# Patient Record
Sex: Female | Born: 1940 | Race: White | Hispanic: No | Marital: Married | Smoking: Never smoker
Health system: Southern US, Community
[De-identification: ages and names within clinical notes are randomized; demographics above are authoritative.]

## PROBLEM LIST (undated history)

## (undated) DIAGNOSIS — I509 Heart failure, unspecified: Secondary | ICD-10-CM

## (undated) DIAGNOSIS — F329 Major depressive disorder, single episode, unspecified: Secondary | ICD-10-CM

## (undated) DIAGNOSIS — F0390 Unspecified dementia without behavioral disturbance: Secondary | ICD-10-CM

## (undated) DIAGNOSIS — M109 Gout, unspecified: Secondary | ICD-10-CM

## (undated) DIAGNOSIS — H409 Unspecified glaucoma: Secondary | ICD-10-CM

## (undated) DIAGNOSIS — D649 Anemia, unspecified: Secondary | ICD-10-CM

## (undated) DIAGNOSIS — F32A Depression, unspecified: Secondary | ICD-10-CM

## (undated) DIAGNOSIS — N289 Disorder of kidney and ureter, unspecified: Secondary | ICD-10-CM

## (undated) DIAGNOSIS — I639 Cerebral infarction, unspecified: Secondary | ICD-10-CM

## (undated) DIAGNOSIS — Z950 Presence of cardiac pacemaker: Secondary | ICD-10-CM

## (undated) DIAGNOSIS — I739 Peripheral vascular disease, unspecified: Secondary | ICD-10-CM

## (undated) DIAGNOSIS — I2510 Atherosclerotic heart disease of native coronary artery without angina pectoris: Secondary | ICD-10-CM

## (undated) DIAGNOSIS — N189 Chronic kidney disease, unspecified: Secondary | ICD-10-CM

## (undated) DIAGNOSIS — I10 Essential (primary) hypertension: Secondary | ICD-10-CM

## (undated) DIAGNOSIS — F419 Anxiety disorder, unspecified: Secondary | ICD-10-CM

## (undated) HISTORY — PX: CAROTID ENDARTERECTOMY: SUR193

## (undated) HISTORY — DX: Major depressive disorder, single episode, unspecified: F32.9

## (undated) HISTORY — PX: ABDOMINAL SURGERY: SHX537

## (undated) HISTORY — DX: Gout, unspecified: M10.9

## (undated) HISTORY — DX: Essential (primary) hypertension: I10

## (undated) HISTORY — DX: Cerebral infarction, unspecified: I63.9

## (undated) HISTORY — DX: Depression, unspecified: F32.A

## (undated) HISTORY — PX: CHOLECYSTECTOMY: SHX55

## (undated) HISTORY — DX: Heart failure, unspecified: I50.9

## (undated) HISTORY — PX: CORONARY ANGIOPLASTY WITH STENT PLACEMENT: SHX49

## (undated) HISTORY — DX: Anxiety disorder, unspecified: F41.9

## (undated) HISTORY — DX: Unspecified glaucoma: H40.9

## (undated) HISTORY — DX: Anemia, unspecified: D64.9

## (undated) HISTORY — DX: Peripheral vascular disease, unspecified: I73.9

## (undated) HISTORY — PX: PERCUTANEOUS PLACEMENT INTRAVASCULAR STENT CERVICAL CAROTID ARTERY: SUR1019

## (undated) HISTORY — PX: APPENDECTOMY: SHX54

## (undated) HISTORY — DX: Atherosclerotic heart disease of native coronary artery without angina pectoris: I25.10

## (undated) HISTORY — DX: Chronic kidney disease, unspecified: N18.9

## (undated) HISTORY — PX: PACEMAKER INSERTION: SHX728

---

## 2007-02-23 ENCOUNTER — Emergency Department: Payer: Self-pay | Admitting: Emergency Medicine

## 2007-06-05 ENCOUNTER — Ambulatory Visit: Payer: Self-pay | Admitting: Vascular Surgery

## 2007-06-18 ENCOUNTER — Emergency Department: Payer: Self-pay | Admitting: Emergency Medicine

## 2008-01-22 ENCOUNTER — Emergency Department (HOSPITAL_COMMUNITY): Admission: EM | Admit: 2008-01-22 | Discharge: 2008-01-22 | Payer: Self-pay | Admitting: Family Medicine

## 2010-10-11 NOTE — Procedures (Signed)
CAROTID DUPLEX EXAM   INDICATION:  Follow-up evaluation of known coronary artery disease.   HISTORY:  Diabetes:  Yes.  Cardiac:  No.  Hypertension:  Yes.  Smoking:  No.  Previous Surgery:  Right carotid endarterectomy by Rosezetta Schlatter on  March 28, 2007.  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  Amaurosis Fugax No, Paresthesias No, Hemiparesis Yes                                       RIGHT             LEFT  Brachial systolic pressure:         148               160  Brachial Doppler waveforms:         Triphasic         Triphasic  Vertebral direction of flow:        Antegrade         Antegrade  DUPLEX VELOCITIES (cm/sec)  CCA peak systolic                   54                63  ECA peak systolic                   246               111  ICA peak systolic                   201               Occluded  ICA end diastolic                   66                Occluded  PLAQUE MORPHOLOGY:                  Mixed             Mixed  PLAQUE AMOUNT:                      Moderate          Severe  PLAQUE LOCATION:                    Proximal ICA, ECA Throughout ICA   IMPRESSION:  1. 60-79% right internal carotid artery stenosis.  2. Occluded left internal carotid artery.  3. RECA stenosis.   ___________________________________________  Larina Earthly, M.D.   MC/MEDQ  D:  06/05/2007  T:  06/05/2007  Job:  [id number]

## 2012-09-24 LAB — COMPREHENSIVE METABOLIC PANEL
Albumin: 3.3 g/dL — ABNORMAL LOW (ref 3.4–5.0)
Alkaline Phosphatase: 125 U/L (ref 50–136)
Creatinine: 1.89 mg/dL — ABNORMAL HIGH (ref 0.60–1.30)
EGFR (Non-African Amer.): 26 — ABNORMAL LOW
Glucose: 136 mg/dL — ABNORMAL HIGH (ref 65–99)
Potassium: 3.7 mmol/L (ref 3.5–5.1)
SGOT(AST): 25 U/L (ref 15–37)
SGPT (ALT): 13 U/L (ref 12–78)
Sodium: 139 mmol/L (ref 136–145)
Total Protein: 7.6 g/dL (ref 6.4–8.2)

## 2012-09-24 LAB — CBC
HGB: 10.5 g/dL — ABNORMAL LOW (ref 12.0–16.0)
MCH: 31.8 pg (ref 26.0–34.0)
MCV: 96 fL (ref 80–100)
RDW: 15.5 % — ABNORMAL HIGH (ref 11.5–14.5)
WBC: 9.5 10*3/uL (ref 3.6–11.0)

## 2012-09-24 LAB — TROPONIN I: Troponin-I: <0.02 ng/mL

## 2012-09-24 LAB — PRO B NATRIURETIC PEPTIDE: B-Type Natriuretic Peptide: 5367 pg/mL — ABNORMAL HIGH (ref 0–125)

## 2012-09-25 ENCOUNTER — Inpatient Hospital Stay: Payer: Self-pay | Admitting: Internal Medicine

## 2012-09-25 LAB — CBC WITH DIFFERENTIAL/PLATELET
Basophil #: 0.1 10*3/uL (ref 0.0–0.1)
Basophil %: 0.8 %
HCT: 28.8 % — ABNORMAL LOW (ref 35.0–47.0)
HGB: 9.8 g/dL — ABNORMAL LOW (ref 12.0–16.0)
Lymphocyte #: 1.1 10*3/uL (ref 1.0–3.6)
MCH: 32.4 pg (ref 26.0–34.0)
MCHC: 34 g/dL (ref 32.0–36.0)
Monocyte %: 9.6 %
Neutrophil #: 4.6 10*3/uL (ref 1.4–6.5)
RBC: 3.02 10*6/uL — ABNORMAL LOW (ref 3.80–5.20)
RDW: 14.7 % — ABNORMAL HIGH (ref 11.5–14.5)
WBC: 6.7 10*3/uL (ref 3.6–11.0)

## 2012-09-25 LAB — LIPID PANEL
Cholesterol: 107 mg/dL (ref 0–200)
HDL Cholesterol: 39 mg/dL — ABNORMAL LOW (ref 40–60)
Ldl Cholesterol, Calc: 56 mg/dL (ref 0–100)
VLDL Cholesterol, Calc: 12 mg/dL (ref 5–40)

## 2012-09-25 LAB — BASIC METABOLIC PANEL
Anion Gap: 8 (ref 7–16)
BUN: 62 mg/dL — ABNORMAL HIGH (ref 7–18)
Chloride: 109 mmol/L — ABNORMAL HIGH (ref 98–107)
Co2: 24 mmol/L (ref 21–32)
EGFR (African American): 29 — ABNORMAL LOW
EGFR (Non-African Amer.): 25 — ABNORMAL LOW
Sodium: 141 mmol/L (ref 136–145)

## 2012-09-25 LAB — TROPONIN I: Troponin-I: <0.02 ng/mL

## 2012-09-25 LAB — CK TOTAL AND CKMB (NOT AT ARMC)
CK, Total: 49 U/L (ref 21–215)
CK, Total: 27 U/L (ref 21–215)
CK, Total: 28 U/L (ref 21–215)

## 2012-09-25 LAB — MAGNESIUM: Magnesium: 1.9 mg/dL

## 2012-09-25 LAB — HEMOGLOBIN A1C: Hemoglobin A1C: 5.2 % (ref 4.2–6.3)

## 2012-09-26 ENCOUNTER — Ambulatory Visit: Payer: Self-pay | Admitting: Internal Medicine

## 2012-09-26 LAB — CBC WITH DIFFERENTIAL/PLATELET
Basophil #: 0.1 10*3/uL (ref 0.0–0.1)
Eosinophil #: 0.3 10*3/uL (ref 0.0–0.7)
Eosinophil %: 4.1 %
Lymphocyte #: 1.1 10*3/uL (ref 1.0–3.6)
Lymphocyte %: 14.6 %
MCH: 31.6 pg (ref 26.0–34.0)
MCHC: 33.1 g/dL (ref 32.0–36.0)
MCV: 96 fL (ref 80–100)
Monocyte %: 8.6 %
Neutrophil %: 71.3 %
RBC: 3.26 10*6/uL — ABNORMAL LOW (ref 3.80–5.20)
WBC: 7.3 10*3/uL (ref 3.6–11.0)

## 2012-09-26 LAB — COMPREHENSIVE METABOLIC PANEL
Albumin: 3 g/dL — ABNORMAL LOW (ref 3.4–5.0)
Alkaline Phosphatase: 85 U/L (ref 50–136)
BUN: 57 mg/dL — ABNORMAL HIGH (ref 7–18)
Bilirubin,Total: 0.4 mg/dL (ref 0.2–1.0)
Chloride: 109 mmol/L — ABNORMAL HIGH (ref 98–107)
Creatinine: 1.65 mg/dL — ABNORMAL HIGH (ref 0.60–1.30)
EGFR (African American): 36 — ABNORMAL LOW
EGFR (Non-African Amer.): 31 — ABNORMAL LOW
Osmolality: 299 (ref 275–301)
Potassium: 3.2 mmol/L — ABNORMAL LOW (ref 3.5–5.1)
SGPT (ALT): 10 U/L — ABNORMAL LOW (ref 12–78)
Sodium: 142 mmol/L (ref 136–145)
Total Protein: 6.5 g/dL (ref 6.4–8.2)

## 2012-09-27 LAB — COMPREHENSIVE METABOLIC PANEL
Anion Gap: 9 (ref 7–16)
BUN: 53 mg/dL — ABNORMAL HIGH (ref 7–18)
Bilirubin,Total: 0.5 mg/dL (ref 0.2–1.0)
Calcium, Total: 9.3 mg/dL (ref 8.5–10.1)
Chloride: 108 mmol/L — ABNORMAL HIGH (ref 98–107)
Co2: 25 mmol/L (ref 21–32)
Creatinine: 1.51 mg/dL — ABNORMAL HIGH (ref 0.60–1.30)
Osmolality: 297 (ref 275–301)
SGOT(AST): 19 U/L (ref 15–37)
SGPT (ALT): 10 U/L — ABNORMAL LOW (ref 12–78)
Sodium: 142 mmol/L (ref 136–145)

## 2012-09-27 LAB — CA 125: CA 125: 272.9 U/mL — ABNORMAL HIGH (ref 0.0–34.0)

## 2012-09-27 LAB — CEA: CEA: 3.3 ng/mL (ref 0.0–4.7)

## 2012-10-10 ENCOUNTER — Ambulatory Visit: Payer: Self-pay | Admitting: Internal Medicine

## 2012-10-14 ENCOUNTER — Other Ambulatory Visit: Payer: Self-pay | Admitting: Family Medicine

## 2012-10-14 LAB — CBC WITH DIFFERENTIAL/PLATELET
HCT: 38.7 % (ref 35.0–47.0)
Lymphocyte #: 1.4 10*3/uL (ref 1.0–3.6)
Lymphocyte %: 13.9 %
MCH: 31.2 pg (ref 26.0–34.0)
MCHC: 32.7 g/dL (ref 32.0–36.0)
MCV: 96 fL (ref 80–100)
Neutrophil %: 73.7 %
Platelet: 283 10*3/uL (ref 150–440)
RBC: 4.05 10*6/uL (ref 3.80–5.20)
RDW: 15.2 % — ABNORMAL HIGH (ref 11.5–14.5)
WBC: 9.9 10*3/uL (ref 3.6–11.0)

## 2012-10-14 LAB — IRON AND TIBC
Iron: 86 ug/dL (ref 50–170)
Unbound Iron-Bind.Cap.: 277 ug/dL

## 2012-10-14 LAB — FERRITIN: Ferritin (ARMC): 144 ng/mL (ref 8–388)

## 2012-10-14 LAB — RETICULOCYTES: Reticulocyte: 1.5 %

## 2012-10-14 LAB — CREATININE, SERUM
EGFR (African American): 34 — ABNORMAL LOW
EGFR (Non-African Amer.): 29 — ABNORMAL LOW

## 2012-10-14 LAB — SEDIMENTATION RATE: Erythrocyte Sed Rate: 30 mm/hr (ref 0–30)

## 2012-10-15 LAB — PROT IMMUNOELECTROPHORES(ARMC)

## 2012-10-27 ENCOUNTER — Ambulatory Visit: Payer: Self-pay | Admitting: Internal Medicine

## 2012-10-30 LAB — BASIC METABOLIC PANEL
BUN: 69 mg/dL — ABNORMAL HIGH (ref 7–18)
Calcium, Total: 9.8 mg/dL (ref 8.5–10.1)
Chloride: 101 mmol/L (ref 98–107)
Co2: 25 mmol/L (ref 21–32)
EGFR (African American): 31 — ABNORMAL LOW
Glucose: 149 mg/dL — ABNORMAL HIGH (ref 65–99)
Osmolality: 300 (ref 275–301)
Potassium: 3.4 mmol/L — ABNORMAL LOW (ref 3.5–5.1)
Sodium: 139 mmol/L (ref 136–145)

## 2012-11-07 LAB — BASIC METABOLIC PANEL
Chloride: 101 mmol/L (ref 98–107)
Co2: 21 mmol/L (ref 21–32)
Creatinine: 1.81 mg/dL — ABNORMAL HIGH (ref 0.60–1.30)
EGFR (Non-African Amer.): 28 — ABNORMAL LOW
Glucose: 170 mg/dL — ABNORMAL HIGH (ref 65–99)
Osmolality: 300 (ref 275–301)
Potassium: 3.4 mmol/L — ABNORMAL LOW (ref 3.5–5.1)

## 2012-11-08 LAB — CA 125: CA 125: 29.4 U/mL (ref 0.0–34.0)

## 2012-11-26 ENCOUNTER — Ambulatory Visit: Payer: Self-pay | Admitting: Internal Medicine

## 2012-12-09 LAB — CBC CANCER CENTER
Basophil #: 0.1 x10 3/mm (ref 0.0–0.1)
Basophil %: 1 %
Eosinophil #: 0.3 x10 3/mm (ref 0.0–0.7)
HGB: 12 g/dL (ref 12.0–16.0)
Lymphocyte #: 1.6 x10 3/mm (ref 1.0–3.6)
Lymphocyte %: 18.6 %
MCH: 32.3 pg (ref 26.0–34.0)
Monocyte #: 0.7 x10 3/mm (ref 0.2–0.9)
Monocyte %: 8.7 %
Neutrophil %: 67.7 %
Platelet: 307 x10 3/mm (ref 150–440)

## 2012-12-09 LAB — URINALYSIS, COMPLETE
Bacteria: NONE SEEN
Bilirubin,UR: NEGATIVE
Blood: NEGATIVE
Glucose,UR: NEGATIVE mg/dL (ref 0–75)
Ketone: NEGATIVE
Nitrite: NEGATIVE
Ph: 7 (ref 4.5–8.0)
WBC UR: 94 /HPF (ref 0–5)

## 2012-12-09 LAB — COMPREHENSIVE METABOLIC PANEL
Alkaline Phosphatase: 152 U/L — ABNORMAL HIGH (ref 50–136)
Anion Gap: 12 (ref 7–16)
BUN: 37 mg/dL — ABNORMAL HIGH (ref 7–18)
Bilirubin,Total: 0.5 mg/dL (ref 0.2–1.0)
Chloride: 102 mmol/L (ref 98–107)
Creatinine: 1.48 mg/dL — ABNORMAL HIGH (ref 0.60–1.30)
EGFR (Non-African Amer.): 36 — ABNORMAL LOW
Potassium: 3.9 mmol/L (ref 3.5–5.1)
SGPT (ALT): 17 U/L (ref 12–78)
Sodium: 139 mmol/L (ref 136–145)
Total Protein: 7.6 g/dL (ref 6.4–8.2)

## 2012-12-10 LAB — CANCER ANTIGEN 19-9: CA 19-9: 23 U/mL (ref 0–35)

## 2012-12-11 LAB — URINE CULTURE

## 2012-12-27 ENCOUNTER — Ambulatory Visit: Payer: Self-pay | Admitting: Internal Medicine

## 2013-01-03 ENCOUNTER — Ambulatory Visit: Payer: Self-pay | Admitting: Cardiology

## 2013-01-08 ENCOUNTER — Ambulatory Visit: Payer: Self-pay | Admitting: Internal Medicine

## 2013-01-08 LAB — CBC CANCER CENTER
Basophil #: 0.1 x10 3/mm (ref 0.0–0.1)
Basophil %: 1.3 %
Eosinophil %: 4 %
HGB: 11.9 g/dL — ABNORMAL LOW (ref 12.0–16.0)
Lymphocyte #: 1.2 x10 3/mm (ref 1.0–3.6)
Lymphocyte %: 13.8 %
MCH: 32.8 pg (ref 26.0–34.0)
MCHC: 34.2 g/dL (ref 32.0–36.0)
MCV: 96 fL (ref 80–100)
Monocyte #: 0.5 x10 3/mm (ref 0.2–0.9)
Monocyte %: 6.1 %
Neutrophil %: 74.8 %
RBC: 3.62 10*6/uL — ABNORMAL LOW (ref 3.80–5.20)
RDW: 15.4 % — ABNORMAL HIGH (ref 11.5–14.5)
WBC: 8.4 x10 3/mm (ref 3.6–11.0)

## 2013-01-08 LAB — BASIC METABOLIC PANEL
Anion Gap: 12 (ref 7–16)
BUN: 60 mg/dL — ABNORMAL HIGH (ref 7–18)
Co2: 26 mmol/L (ref 21–32)
Creatinine: 1.49 mg/dL — ABNORMAL HIGH (ref 0.60–1.30)
EGFR (African American): 41 — ABNORMAL LOW
EGFR (Non-African Amer.): 35 — ABNORMAL LOW
Glucose: 119 mg/dL — ABNORMAL HIGH (ref 65–99)
Osmolality: 301 (ref 275–301)
Potassium: 3.7 mmol/L (ref 3.5–5.1)
Sodium: 142 mmol/L (ref 136–145)

## 2013-01-22 LAB — CBC CANCER CENTER
Basophil #: 0.1 x10 3/mm (ref 0.0–0.1)
Eosinophil #: 0.3 x10 3/mm (ref 0.0–0.7)
Lymphocyte #: 1.6 x10 3/mm (ref 1.0–3.6)
Lymphocyte %: 18.5 %
Monocyte %: 9.6 %
Neutrophil #: 5.8 x10 3/mm (ref 1.4–6.5)
RBC: 3.72 10*6/uL — ABNORMAL LOW (ref 3.80–5.20)

## 2013-01-22 LAB — BASIC METABOLIC PANEL
Anion Gap: 7 (ref 7–16)
BUN: 56 mg/dL — ABNORMAL HIGH (ref 7–18)
Calcium, Total: 9.4 mg/dL (ref 8.5–10.1)
Chloride: 104 mmol/L (ref 98–107)
Co2: 27 mmol/L (ref 21–32)
EGFR (African American): 38 — ABNORMAL LOW
EGFR (Non-African Amer.): 33 — ABNORMAL LOW
Osmolality: 292 (ref 275–301)
Potassium: 4.2 mmol/L (ref 3.5–5.1)

## 2013-01-27 ENCOUNTER — Ambulatory Visit: Payer: Self-pay | Admitting: Internal Medicine

## 2013-02-19 LAB — CBC CANCER CENTER
Eosinophil %: 4 %
HGB: 12.7 g/dL (ref 12.0–16.0)
MCH: 31.5 pg (ref 26.0–34.0)
MCHC: 33 g/dL (ref 32.0–36.0)
MCV: 95 fL (ref 80–100)
Monocyte #: 0.7 x10 3/mm (ref 0.2–0.9)
Monocyte %: 7.9 %
Neutrophil #: 6.6 x10 3/mm — ABNORMAL HIGH (ref 1.4–6.5)
Neutrophil %: 74.5 %
RBC: 4.03 10*6/uL (ref 3.80–5.20)
RDW: 13.7 % (ref 11.5–14.5)

## 2013-02-19 LAB — CREATININE, SERUM
Creatinine: 1.7 mg/dL — ABNORMAL HIGH (ref 0.60–1.30)
EGFR (African American): 35 — ABNORMAL LOW

## 2013-02-20 LAB — KAPPA/LAMBDA FREE LIGHT CHAINS (ARMC)

## 2013-02-26 ENCOUNTER — Ambulatory Visit: Payer: Self-pay | Admitting: Internal Medicine

## 2013-04-21 ENCOUNTER — Ambulatory Visit: Payer: Self-pay | Admitting: Internal Medicine

## 2013-04-21 LAB — CBC CANCER CENTER
Basophil #: 0.1 x10 3/mm (ref 0.0–0.1)
Eosinophil %: 3.8 %
HCT: 36.6 % (ref 35.0–47.0)
Lymphocyte #: 1.3 x10 3/mm (ref 1.0–3.6)
Lymphocyte %: 15.7 %
MCH: 31.3 pg (ref 26.0–34.0)
MCHC: 32.9 g/dL (ref 32.0–36.0)
Monocyte #: 0.7 x10 3/mm (ref 0.2–0.9)
Neutrophil #: 5.6 x10 3/mm (ref 1.4–6.5)
Neutrophil %: 70.5 %
RBC: 3.85 10*6/uL (ref 3.80–5.20)
WBC: 8 x10 3/mm (ref 3.6–11.0)

## 2013-04-21 LAB — CREATININE, SERUM: EGFR (Non-African Amer.): 31 — ABNORMAL LOW

## 2013-04-22 LAB — KAPPA/LAMBDA FREE LIGHT CHAINS (ARMC)

## 2013-04-28 ENCOUNTER — Ambulatory Visit: Payer: Self-pay | Admitting: Internal Medicine

## 2013-08-22 ENCOUNTER — Encounter: Payer: Self-pay | Admitting: Podiatry

## 2013-08-22 ENCOUNTER — Ambulatory Visit (INDEPENDENT_AMBULATORY_CARE_PROVIDER_SITE_OTHER): Payer: Medicare Other | Admitting: Podiatry

## 2013-08-22 VITALS — Resp 16 | Ht 61.0 in | Wt 200.0 lb

## 2013-08-22 DIAGNOSIS — B351 Tinea unguium: Secondary | ICD-10-CM

## 2013-08-22 NOTE — Progress Notes (Signed)
Subjective:     Patient ID: Sally Curry, female   DOB: Dec 28, 1940, 73 y.o.   MRN: [id number]  HPI patient presents with nail disease 1-5 both feet that she cannot cut   Review of Systems     Objective:   Physical Exam Neurovascular status intact with thick nail bed 1-5 both feet nontender    Assessment:     Mycotic nail infection with pain 1-5 both feet    Plan:     Debridement painful nailbeds 1-5 both feet with no bleeding noted

## 2013-08-26 ENCOUNTER — Encounter: Payer: Self-pay | Admitting: Podiatry

## 2013-08-30 LAB — URINALYSIS, COMPLETE
Bilirubin,UR: NEGATIVE
Blood: NEGATIVE
GLUCOSE, UR: NEGATIVE mg/dL (ref 0–75)
KETONE: NEGATIVE
Nitrite: NEGATIVE
Ph: 5 (ref 4.5–8.0)
Protein: 100
RBC,UR: 3 /HPF (ref 0–5)
SPECIFIC GRAVITY: 1.011 (ref 1.003–1.030)
SQUAMOUS EPITHELIAL: NONE SEEN

## 2013-08-30 LAB — CBC WITH DIFFERENTIAL/PLATELET
Basophil #: 0.1 10*3/uL (ref 0.0–0.1)
Basophil %: 0.8 %
EOS ABS: 0.5 10*3/uL (ref 0.0–0.7)
Eosinophil %: 4.8 %
HCT: 38.2 % (ref 35.0–47.0)
HGB: 12.4 g/dL (ref 12.0–16.0)
LYMPHS PCT: 13.3 %
Lymphocyte #: 1.3 10*3/uL (ref 1.0–3.6)
MCH: 31 pg (ref 26.0–34.0)
MCHC: 32.4 g/dL (ref 32.0–36.0)
MCV: 96 fL (ref 80–100)
MONO ABS: 0.8 x10 3/mm (ref 0.2–0.9)
MONOS PCT: 7.8 %
Neutrophil #: 7.3 10*3/uL — ABNORMAL HIGH (ref 1.4–6.5)
Neutrophil %: 73.3 %
Platelet: 249 10*3/uL (ref 150–440)
RBC: 3.99 10*6/uL (ref 3.80–5.20)
RDW: 13.9 % (ref 11.5–14.5)
WBC: 10 10*3/uL (ref 3.6–11.0)

## 2013-08-30 LAB — BASIC METABOLIC PANEL
ANION GAP: 9 (ref 7–16)
BUN: 37 mg/dL — ABNORMAL HIGH (ref 7–18)
CHLORIDE: 106 mmol/L (ref 98–107)
CO2: 22 mmol/L (ref 21–32)
Calcium, Total: 9.2 mg/dL (ref 8.5–10.1)
Creatinine: 1.43 mg/dL — ABNORMAL HIGH (ref 0.60–1.30)
EGFR (African American): 43 — ABNORMAL LOW
EGFR (Non-African Amer.): 37 — ABNORMAL LOW
Glucose: 174 mg/dL — ABNORMAL HIGH (ref 65–99)
OSMOLALITY: 287 (ref 275–301)
POTASSIUM: 4 mmol/L (ref 3.5–5.1)
SODIUM: 137 mmol/L (ref 136–145)

## 2013-08-30 LAB — TROPONIN I

## 2013-08-30 LAB — PRO B NATRIURETIC PEPTIDE: B-Type Natriuretic Peptide: 740 pg/mL — ABNORMAL HIGH (ref 0–125)

## 2013-08-31 ENCOUNTER — Inpatient Hospital Stay: Payer: Self-pay | Admitting: Internal Medicine

## 2013-08-31 LAB — CK: CK, Total: 95 U/L

## 2013-09-01 LAB — BASIC METABOLIC PANEL
Anion Gap: 7 (ref 7–16)
BUN: 30 mg/dL — ABNORMAL HIGH (ref 7–18)
Calcium, Total: 9.3 mg/dL (ref 8.5–10.1)
Chloride: 112 mmol/L — ABNORMAL HIGH (ref 98–107)
Co2: 24 mmol/L (ref 21–32)
Creatinine: 1.21 mg/dL (ref 0.60–1.30)
EGFR (African American): 52 — ABNORMAL LOW
GFR CALC NON AF AMER: 45 — AB
GLUCOSE: 120 mg/dL — AB (ref 65–99)
Osmolality: 292 (ref 275–301)
Potassium: 3.5 mmol/L (ref 3.5–5.1)
Sodium: 143 mmol/L (ref 136–145)

## 2013-09-01 LAB — CBC WITH DIFFERENTIAL/PLATELET
Basophil #: 0.1 10*3/uL (ref 0.0–0.1)
Basophil %: 0.9 %
EOS PCT: 5.2 %
Eosinophil #: 0.3 10*3/uL (ref 0.0–0.7)
HCT: 33.8 % — ABNORMAL LOW (ref 35.0–47.0)
HGB: 11.4 g/dL — ABNORMAL LOW (ref 12.0–16.0)
Lymphocyte #: 1.1 10*3/uL (ref 1.0–3.6)
Lymphocyte %: 16.8 %
MCH: 31.9 pg (ref 26.0–34.0)
MCHC: 33.6 g/dL (ref 32.0–36.0)
MCV: 95 fL (ref 80–100)
Monocyte #: 0.6 x10 3/mm (ref 0.2–0.9)
Monocyte %: 9.4 %
NEUTROS ABS: 4.5 10*3/uL (ref 1.4–6.5)
Neutrophil %: 67.7 %
PLATELETS: 203 10*3/uL (ref 150–440)
RBC: 3.56 10*6/uL — AB (ref 3.80–5.20)
RDW: 13.7 % (ref 11.5–14.5)
WBC: 6.6 10*3/uL (ref 3.6–11.0)

## 2013-09-02 LAB — URINE CULTURE

## 2013-11-21 ENCOUNTER — Ambulatory Visit: Payer: Medicare Other | Admitting: Podiatry

## 2013-12-19 ENCOUNTER — Ambulatory Visit (INDEPENDENT_AMBULATORY_CARE_PROVIDER_SITE_OTHER): Payer: Medicare Other | Admitting: Podiatry

## 2013-12-19 DIAGNOSIS — B351 Tinea unguium: Secondary | ICD-10-CM

## 2013-12-19 DIAGNOSIS — M79609 Pain in unspecified limb: Secondary | ICD-10-CM

## 2013-12-20 NOTE — Progress Notes (Signed)
Subjective:     Patient ID: Sally Curry, female   DOB: [birth year], 73 y.o.   MRN: [id number]  HPI patient presents with nail disease and thickness 1-5 of both feet along with pain present   Review of Systems     Objective:   Physical Exam Neurovascular status unchanged with thick yellow brittle nailbeds 1-5 both feet    Assessment:     Mycotic nail infection is with pain 1-5 both feet    Plan:     Debride painful nailbeds 1-5 both feet with no iatrogenic bleeding noted

## 2014-01-15 ENCOUNTER — Inpatient Hospital Stay: Payer: Self-pay | Admitting: Internal Medicine

## 2014-01-15 LAB — URINALYSIS, COMPLETE
BACTERIA: NONE SEEN
BLOOD: NEGATIVE
Bilirubin,UR: NEGATIVE
Glucose,UR: NEGATIVE mg/dL (ref 0–75)
Hyaline Cast: 6
Ketone: NEGATIVE
LEUKOCYTE ESTERASE: NEGATIVE
NITRITE: NEGATIVE
Ph: 5 (ref 4.5–8.0)
Protein: NEGATIVE
RBC, UR: NONE SEEN /HPF (ref 0–5)
SPECIFIC GRAVITY: 1.01 (ref 1.003–1.030)
SQUAMOUS EPITHELIAL: NONE SEEN
WBC UR: <1 /HPF (ref 0–5)

## 2014-01-15 LAB — BASIC METABOLIC PANEL
Anion Gap: 17 — ABNORMAL HIGH (ref 7–16)
BUN: 123 mg/dL — ABNORMAL HIGH (ref 7–18)
Calcium, Total: 9.8 mg/dL (ref 8.5–10.1)
Chloride: 105 mmol/L (ref 98–107)
Co2: 18 mmol/L — ABNORMAL LOW (ref 21–32)
Creatinine: 2.11 mg/dL — ABNORMAL HIGH (ref 0.60–1.30)
EGFR (African American): 27 — ABNORMAL LOW
EGFR (Non-African Amer.): 23 — ABNORMAL LOW
Glucose: 179 mg/dL — ABNORMAL HIGH (ref 65–99)
OSMOLALITY: 323 (ref 275–301)
Potassium: 4.1 mmol/L (ref 3.5–5.1)
SODIUM: 140 mmol/L (ref 136–145)

## 2014-01-15 LAB — CBC WITH DIFFERENTIAL/PLATELET
Basophil #: 0 10*3/uL (ref 0.0–0.1)
Basophil %: 0.2 %
Eosinophil #: 0.1 10*3/uL (ref 0.0–0.7)
Eosinophil %: 0.6 %
HCT: 25.5 % — ABNORMAL LOW (ref 35.0–47.0)
HGB: 8 g/dL — ABNORMAL LOW (ref 12.0–16.0)
LYMPHS PCT: 6.8 %
Lymphocyte #: 1 10*3/uL (ref 1.0–3.6)
MCH: 30.2 pg (ref 26.0–34.0)
MCHC: 31.4 g/dL — ABNORMAL LOW (ref 32.0–36.0)
MCV: 96 fL (ref 80–100)
MONOS PCT: 3.4 %
Monocyte #: 0.5 x10 3/mm (ref 0.2–0.9)
Neutrophil #: 13.3 10*3/uL — ABNORMAL HIGH (ref 1.4–6.5)
Neutrophil %: 89 %
Platelet: 271 10*3/uL (ref 150–440)
RBC: 2.65 10*6/uL — ABNORMAL LOW (ref 3.80–5.20)
RDW: 13.9 % (ref 11.5–14.5)
WBC: 14.9 10*3/uL — AB (ref 3.6–11.0)

## 2014-01-15 LAB — TROPONIN I: Troponin-I: <0.02 ng/mL

## 2014-01-15 LAB — PROTIME-INR
INR: 1.2
Prothrombin Time: 15 secs — ABNORMAL HIGH (ref 11.5–14.7)

## 2014-01-15 LAB — APTT: Activated PTT: 25.7 secs (ref 23.6–35.9)

## 2014-01-16 LAB — CBC WITH DIFFERENTIAL/PLATELET
BASOS ABS: 0 10*3/uL (ref 0.0–0.1)
Basophil %: 0.4 %
EOS PCT: 2.1 %
Eosinophil #: 0.2 10*3/uL (ref 0.0–0.7)
HCT: 22.3 % — AB (ref 35.0–47.0)
HGB: 7.3 g/dL — ABNORMAL LOW (ref 12.0–16.0)
LYMPHS ABS: 1.3 10*3/uL (ref 1.0–3.6)
Lymphocyte %: 13.8 %
MCH: 31.9 pg (ref 26.0–34.0)
MCHC: 33 g/dL (ref 32.0–36.0)
MCV: 97 fL (ref 80–100)
Monocyte #: 0.6 x10 3/mm (ref 0.2–0.9)
Monocyte %: 6.8 %
NEUTROS ABS: 7.1 10*3/uL — AB (ref 1.4–6.5)
Neutrophil %: 76.9 %
Platelet: 190 10*3/uL (ref 150–440)
RBC: 2.3 10*6/uL — ABNORMAL LOW (ref 3.80–5.20)
RDW: 13.7 % (ref 11.5–14.5)
WBC: 9.2 10*3/uL (ref 3.6–11.0)

## 2014-01-16 LAB — BASIC METABOLIC PANEL
Anion Gap: 13 (ref 7–16)
BUN: 103 mg/dL — ABNORMAL HIGH (ref 7–18)
Calcium, Total: 8.6 mg/dL (ref 8.5–10.1)
Chloride: 111 mmol/L — ABNORMAL HIGH (ref 98–107)
Co2: 21 mmol/L (ref 21–32)
Creatinine: 1.81 mg/dL — ABNORMAL HIGH (ref 0.60–1.30)
EGFR (African American): 32 — ABNORMAL LOW
EGFR (Non-African Amer.): 28 — ABNORMAL LOW
GLUCOSE: 98 mg/dL (ref 65–99)
OSMOLALITY: 321 (ref 275–301)
POTASSIUM: 3.3 mmol/L — AB (ref 3.5–5.1)
Sodium: 145 mmol/L (ref 136–145)

## 2014-01-17 LAB — CBC WITH DIFFERENTIAL/PLATELET
BASOS ABS: 0 10*3/uL (ref 0.0–0.1)
Basophil %: 0.6 %
Eosinophil #: 0.3 10*3/uL (ref 0.0–0.7)
Eosinophil %: 4.7 %
HCT: 24.7 % — ABNORMAL LOW (ref 35.0–47.0)
HGB: 8.2 g/dL — AB (ref 12.0–16.0)
LYMPHS ABS: 0.8 10*3/uL — AB (ref 1.0–3.6)
Lymphocyte %: 11.6 %
MCH: 31.5 pg (ref 26.0–34.0)
MCHC: 33.2 g/dL (ref 32.0–36.0)
MCV: 95 fL (ref 80–100)
MONO ABS: 0.4 x10 3/mm (ref 0.2–0.9)
Monocyte %: 5.7 %
NEUTROS ABS: 5.3 10*3/uL (ref 1.4–6.5)
NEUTROS PCT: 77.4 %
Platelet: 181 10*3/uL (ref 150–440)
RBC: 2.6 10*6/uL — ABNORMAL LOW (ref 3.80–5.20)
RDW: 15.3 % — ABNORMAL HIGH (ref 11.5–14.5)
WBC: 6.8 10*3/uL (ref 3.6–11.0)

## 2014-01-18 LAB — BASIC METABOLIC PANEL
Anion Gap: 11 (ref 7–16)
BUN: 49 mg/dL — ABNORMAL HIGH (ref 7–18)
CHLORIDE: 117 mmol/L — AB (ref 98–107)
Calcium, Total: 9.1 mg/dL (ref 8.5–10.1)
Co2: 20 mmol/L — ABNORMAL LOW (ref 21–32)
Creatinine: 1.29 mg/dL (ref 0.60–1.30)
GFR CALC AF AMER: 48 — AB
GFR CALC NON AF AMER: 42 — AB
GLUCOSE: 118 mg/dL — AB (ref 65–99)
Osmolality: 308 (ref 275–301)
POTASSIUM: 3.1 mmol/L — AB (ref 3.5–5.1)
Sodium: 148 mmol/L — ABNORMAL HIGH (ref 136–145)

## 2014-01-18 LAB — CBC WITH DIFFERENTIAL/PLATELET
Basophil #: 0.1 10*3/uL (ref 0.0–0.1)
Basophil %: 0.6 %
Eosinophil #: 0.4 10*3/uL (ref 0.0–0.7)
Eosinophil %: 4.2 %
HCT: 24.8 % — AB (ref 35.0–47.0)
HGB: 8.2 g/dL — AB (ref 12.0–16.0)
LYMPHS PCT: 8.3 %
Lymphocyte #: 0.8 10*3/uL — ABNORMAL LOW (ref 1.0–3.6)
MCH: 31.8 pg (ref 26.0–34.0)
MCHC: 33.2 g/dL (ref 32.0–36.0)
MCV: 96 fL (ref 80–100)
MONOS PCT: 6.1 %
Monocyte #: 0.6 x10 3/mm (ref 0.2–0.9)
NEUTROS PCT: 80.8 %
Neutrophil #: 7.3 10*3/uL — ABNORMAL HIGH (ref 1.4–6.5)
Platelet: 180 10*3/uL (ref 150–440)
RBC: 2.59 10*6/uL — ABNORMAL LOW (ref 3.80–5.20)
RDW: 15.3 % — ABNORMAL HIGH (ref 11.5–14.5)
WBC: 9.1 10*3/uL (ref 3.6–11.0)

## 2014-01-19 LAB — CBC WITH DIFFERENTIAL/PLATELET
BASOS ABS: 0 10*3/uL (ref 0.0–0.1)
Basophil %: 0.1 %
Eosinophil #: 0 10*3/uL (ref 0.0–0.7)
Eosinophil %: 0 %
HCT: 25.5 % — AB (ref 35.0–47.0)
HGB: 8.3 g/dL — ABNORMAL LOW (ref 12.0–16.0)
Lymphocyte #: 0.5 10*3/uL — ABNORMAL LOW (ref 1.0–3.6)
Lymphocyte %: 5.6 %
MCH: 31.4 pg (ref 26.0–34.0)
MCHC: 32.7 g/dL (ref 32.0–36.0)
MCV: 96 fL (ref 80–100)
Monocyte #: 0.1 x10 3/mm — ABNORMAL LOW (ref 0.2–0.9)
Monocyte %: 1.3 %
NEUTROS ABS: 9 10*3/uL — AB (ref 1.4–6.5)
Neutrophil %: 93 %
Platelet: 173 10*3/uL (ref 150–440)
RBC: 2.65 10*6/uL — ABNORMAL LOW (ref 3.80–5.20)
RDW: 15.1 % — ABNORMAL HIGH (ref 11.5–14.5)
WBC: 9.7 10*3/uL (ref 3.6–11.0)

## 2014-01-19 LAB — BASIC METABOLIC PANEL
ANION GAP: 10 (ref 7–16)
BUN: 39 mg/dL — ABNORMAL HIGH (ref 7–18)
CALCIUM: 8.8 mg/dL (ref 8.5–10.1)
CO2: 18 mmol/L — AB (ref 21–32)
Chloride: 114 mmol/L — ABNORMAL HIGH (ref 98–107)
Creatinine: 1.37 mg/dL — ABNORMAL HIGH (ref 0.60–1.30)
EGFR (African American): 45 — ABNORMAL LOW
EGFR (Non-African Amer.): 39 — ABNORMAL LOW
GLUCOSE: 215 mg/dL — AB (ref 65–99)
OSMOLALITY: 299 (ref 275–301)
POTASSIUM: 4 mmol/L (ref 3.5–5.1)
Sodium: 142 mmol/L (ref 136–145)

## 2014-01-20 ENCOUNTER — Non-Acute Institutional Stay (SKILLED_NURSING_FACILITY): Payer: Medicare Other | Admitting: Adult Health

## 2014-01-20 ENCOUNTER — Encounter: Payer: Self-pay | Admitting: Adult Health

## 2014-01-20 DIAGNOSIS — K922 Gastrointestinal hemorrhage, unspecified: Secondary | ICD-10-CM | POA: Insufficient documentation

## 2014-01-20 DIAGNOSIS — K264 Chronic or unspecified duodenal ulcer with hemorrhage: Secondary | ICD-10-CM

## 2014-01-20 DIAGNOSIS — I10 Essential (primary) hypertension: Secondary | ICD-10-CM

## 2014-01-20 DIAGNOSIS — I2510 Atherosclerotic heart disease of native coronary artery without angina pectoris: Secondary | ICD-10-CM | POA: Insufficient documentation

## 2014-01-20 DIAGNOSIS — E876 Hypokalemia: Secondary | ICD-10-CM

## 2014-01-20 DIAGNOSIS — H409 Unspecified glaucoma: Secondary | ICD-10-CM

## 2014-01-20 DIAGNOSIS — I509 Heart failure, unspecified: Secondary | ICD-10-CM | POA: Insufficient documentation

## 2014-01-20 DIAGNOSIS — F418 Other specified anxiety disorders: Secondary | ICD-10-CM | POA: Insufficient documentation

## 2014-01-20 DIAGNOSIS — E785 Hyperlipidemia, unspecified: Secondary | ICD-10-CM

## 2014-01-20 DIAGNOSIS — F341 Dysthymic disorder: Secondary | ICD-10-CM

## 2014-01-20 DIAGNOSIS — M109 Gout, unspecified: Secondary | ICD-10-CM | POA: Insufficient documentation

## 2014-01-20 LAB — CULTURE, BLOOD (SINGLE)

## 2014-01-20 NOTE — Progress Notes (Signed)
Patient ID: Sally Curry, female   DOB: [birth year], 73 y.o.   MRN: [id number]     [facility]  No Known Allergies   Chief Complaint  Patient presents with  . Hospitalization Follow-up    HPI:  She has been hospitalized following an acute GIB; with anemia requiring a blood transfusion. She was found by EGD to have duodenal ulcerations. She is here for short term rehab. She was alone prior to her hospitalization; her goal is to return back to her home environment.    Past Medical History  Diagnosis Date  . CAD (coronary artery disease)   . Hypertension   . Stroke   . Gout   . PVD (peripheral vascular disease)   . Anemia   . Chronic kidney disease   . Anxiety   . CHF (congestive heart failure)   . Depression   . Glaucoma     Past Surgical History  Procedure Laterality Date  . Coronary angioplasty with stent placement    . Pacemaker insertion    . Cholecystectomy    . Carotid endarterectomy    . Percutaneous placement intravascular stent cervical carotid artery    . Appendectomy     History   Social History  . Marital Status: Married    Spouse Name: N/A    Number of Children: N/A  . Years of Education: N/A   Occupational History  . Not on file.   Social History Main Topics  . Smoking status: Never Smoker   . Smokeless tobacco: Not on file  . Alcohol Use: No  . Drug Use: No  . Sexual Activity: Not on file   Other Topics Concern  . Not on file   Social History Narrative  . No narrative on file     VITAL SIGNS BP 120/60  Pulse 65  Ht 5' (1.524 m)  Wt 201 lb 6.4 oz (91.354 kg)  BMI 39.33 kg/m2   Patient's Medications  New Prescriptions   No medications on file  Previous Medications   AMLODIPINE (NORVASC) 5 MG TABLET    Take 5 mg by mouth daily.   FOLIC ACID (FOLVITE) 1 MG TABLET    Take 1 mg by mouth daily.   FUROSEMIDE (LASIX) 20 MG TABLET    Take 20 mg by mouth daily.   GEMFIBROZIL (LOPID) 600 MG TABLET    Take 600 mg by mouth 2 (two)  times daily before a meal.   HYDRALAZINE (APRESOLINE) 100 MG TABLET    Take 100 mg by mouth 3 (three) times daily.   ISOSORBIDE MONONITRATE (IMDUR) 60 MG 24 HR TABLET    Take 60 mg by mouth daily.   LOVASTATIN (MEVACOR) 40 MG TABLET    Take 40 mg by mouth at bedtime.   METOPROLOL SUCCINATE (TOPROL-XL) 50 MG 24 HR TABLET    Take 50 mg by mouth daily. Take with or immediately following a meal.   MICONAZOLE NITRATE (LOTRIMIN AF) 2 % AERO    Apply topically 2 (two) times daily.   MULTIPLE VITAMIN TABLET    Take 1 tablet by mouth daily.   PANTOPRAZOLE (PROTONIX) 40 MG TABLET    Take 40 mg by mouth 2 (two) times daily.    PAROXETINE (PAXIL) 30 MG TABLET    Take 30 mg by mouth daily.   PILOCARPINE (PILOCAR) 1 % OPHTHALMIC SOLUTION    Place 3 drops into both eyes daily.   POTASSIUM CHLORIDE SA (K-DUR,KLOR-CON) 20 MEQ TABLET    Take 20  mEq by mouth daily.  Modified Medications   No medications on file  Discontinued Medications   ALLOPURINOL (ZYLOPRIM) 100 MG TABLET    Take 100 mg by mouth daily.   ASPIRIN 81 MG TABLET    Take 81 mg by mouth daily.   CLOPIDOGREL (PLAVIX) 75 MG TABLET    Take 75 mg by mouth daily with breakfast.   FLUCONAZOLE (DIFLUCAN) 100 MG TABLET    100 mg. Take one tablet by mouth every 48 hours for 3 doses   HYDROCODONE-ACETAMINOPHEN (NORCO/VICODIN) 5-325 MG PER TABLET    Take 1 tablet by mouth every 4 (four) hours as needed for moderate pain.   SENNOSIDES (SENEXON PO)    Take by mouth as needed.    SIGNIFICANT DIAGNOSTIC EXAMS  01-15-14: chest x-ray: 1. Stable cardiomegaly no chf. cardiac pacemaker in stable position. 2. No acute cardiopulmonary disease.   01-15-14: ct of head: 1. No intracranial hemorrhage or definite acute infarct identified. 2. Chronic left frontal lobe infarct.   01-16-14 EKG: accelerated junction rhythm has replaced sinus rhythm from ekg of 08-30-2013  12/2013: EGD: 2 ulcers in the duodenal bulb with clean base.   LABS REVIEWED:  01-15-14: wbc 14.9; hgb  8.0; hct 25.5; mcv 96; plt 271  glucose 179; bun 123; creat 2.11; k+4.1; na++140 01-16-14: wbc 9.2; hgb 7.3; hct 22.3 ;mcv 97; plt 190  glucose 98; bun 103; creat 1.81; k+3.3; na++145  01-17-14: wbc 6.8; hgb 8.2; hct 24.7; mcv 95; plt 181  01-19-14: glucose 215; bun 39; creat 1.37; k+4.0; na++ 142    Review of Systems  Constitutional: Negative for malaise/fatigue.  Respiratory: Negative for cough and shortness of breath.   Cardiovascular: Negative for chest pain, palpitations and leg swelling.  Gastrointestinal: Negative for heartburn, abdominal pain, diarrhea and constipation.  Musculoskeletal: Negative for joint pain and myalgias.  Skin: Negative.   Neurological: Negative for headaches.  Psychiatric/Behavioral: Negative for depression. The patient is not nervous/anxious.       Physical Exam  Constitutional: She appears well-developed and well-nourished. No distress.  obese  Neck: Neck supple. No JVD present. No thyromegaly present.  Cardiovascular: Normal rate, regular rhythm and intact distal pulses.   Respiratory: Effort normal and breath sounds normal. No respiratory distress. She has no wheezes.  GI: Soft. Bowel sounds are normal. She exhibits no distension. There is no tenderness.  Musculoskeletal: She exhibits no edema.  Has trace lower extremity edema present.  Has right sided hemiplegia present from old cva. Is able to move left sided extremities.    Neurological: She is alert.  Skin: Skin is warm and dry. She is not diaphoretic.      ASSESSMENT/ PLAN:  1. Duodenal ulcer with GI bleed; she is presently stable will continue protonix 40 mg twice daily and will monitor her status. At this time her plavix and asa 81 mg are on hold until she is seen by cardiology.   2. Hypertension: she is stable will continue norvasc 5 mg daily; apresoline 100 mg three times daily; toprol xl 50 mg daily; and will monitor  3. CAD; is presently stable there are no complaints of chest pain  present; her plavix and asa are on hold due to her GIB; will have her follow up with cardiology prior to restarting these medications. Will continue her imdur 60 mg daily and will monitor her status.   4. CHF: she is presently stable will contoinue lasix 20 mg daily; toprol xl 50 mg daily; apresoline 100 mg  three times daily; and will monitor her status   5. Dyslipidemia: will continue mevacor 40 mg daily and lopid 600 mg twice daily  6. Hypokalemia: will continue k+ 20 meq daily   7. Anxiety with depression: she is presently stable will continue paxil 30 mg daily and will monitor   8. Glaucoma: will continue pilocarpine 3 drops to each eye nightly  9. Gout: she has had an acute flare in her right knee; will continue prednisone 60 mg daily through 01-22-14 and will monitor her status.   10. Hyperglycemia: she is presently on prednisone for her gout flare; will begin humalog 5 units prior to meals for cbg >150 and will monitor     Time spent with patient 50 minutes.    Synthia Innocent NP [facility]  Contact [phone] Monday through Friday 8am- 5pm  After hours call [phone]

## 2014-01-26 ENCOUNTER — Non-Acute Institutional Stay (SKILLED_NURSING_FACILITY): Payer: Medicare Other | Admitting: Internal Medicine

## 2014-01-26 DIAGNOSIS — R531 Weakness: Secondary | ICD-10-CM

## 2014-01-26 DIAGNOSIS — K5900 Constipation, unspecified: Secondary | ICD-10-CM

## 2014-01-26 DIAGNOSIS — M109 Gout, unspecified: Secondary | ICD-10-CM

## 2014-01-26 DIAGNOSIS — E785 Hyperlipidemia, unspecified: Secondary | ICD-10-CM

## 2014-01-26 DIAGNOSIS — R5381 Other malaise: Secondary | ICD-10-CM

## 2014-01-26 DIAGNOSIS — I10 Essential (primary) hypertension: Secondary | ICD-10-CM

## 2014-01-26 DIAGNOSIS — D62 Acute posthemorrhagic anemia: Secondary | ICD-10-CM

## 2014-01-26 DIAGNOSIS — R7309 Other abnormal glucose: Secondary | ICD-10-CM

## 2014-01-26 DIAGNOSIS — I2510 Atherosclerotic heart disease of native coronary artery without angina pectoris: Secondary | ICD-10-CM

## 2014-01-26 DIAGNOSIS — R5383 Other fatigue: Secondary | ICD-10-CM

## 2014-01-26 DIAGNOSIS — R739 Hyperglycemia, unspecified: Secondary | ICD-10-CM

## 2014-01-26 DIAGNOSIS — F418 Other specified anxiety disorders: Secondary | ICD-10-CM

## 2014-01-26 DIAGNOSIS — F341 Dysthymic disorder: Secondary | ICD-10-CM

## 2014-01-26 DIAGNOSIS — K264 Chronic or unspecified duodenal ulcer with hemorrhage: Secondary | ICD-10-CM

## 2014-01-26 NOTE — Progress Notes (Signed)
Patient ID: Sally Curry, female   DOB: 04/15/1941, 73 y.o.   MRN: [id number]     Facility: [facility]    PCP: Clide Dales, FNP  Code Status: full code  No Known Allergies  Chief Complaint: new admission  HPI:  73 y/o female patient is here for STR after hospital admission from 01/15/14- 01/19/14 with gi bleed and acute encephalopathy. She underwent EGD showing duodenal ulcers.She received blood transfusion. She also had acute renal failure. She is here for gait training and strengthening exercises. She is seen in her room today with therapist present. She has right sided weakness from old stroke. She also has hx of CAD, CHF, HTN and gout. She gets tired easily. She notices that she has been straining a lot for bowel movement. No melena or rectal bleed.She denies any other complaints this visit.  Review of Systems:  Constitutional: Negative for fever, chills, weight loss, diaphoresis.  HENT: Negative for congestion, hearing loss and sore throat.   Eyes: Negative for eye pain, blurred vision, double vision and discharge.  Respiratory: Negative for cough, sputum production, shortness of breath and wheezing.   Cardiovascular: Negative for chest pain, palpitations, leg swelling.  Gastrointestinal: Negative for heartburn, nausea, vomiting, abdominal pain, diarrhea. Genitourinary: Negative for dysuria Skin: Negative for itching and rash.  Neurological: Negative for dizziness, tingling, headaches.  Psychiatric/Behavioral: Negative for depression    Past Medical History  Diagnosis Date  . CAD (coronary artery disease)   . Hypertension   . Stroke   . Gout   . PVD (peripheral vascular disease)   . Anemia   . Chronic kidney disease   . Anxiety   . CHF (congestive heart failure)   . Depression   . Glaucoma    Past Surgical History  Procedure Laterality Date  . Coronary angioplasty with stent placement    . Pacemaker insertion    . Cholecystectomy      . Carotid endarterectomy    . Percutaneous placement intravascular stent cervical carotid artery    . Appendectomy     Social History:   reports that she has never smoked. She does not have any smokeless tobacco history on file. She reports that she does not drink alcohol or use illicit drugs.  No family history on file.  Medications: Patient's Medications  New Prescriptions   No medications on file  Previous Medications   AMLODIPINE (NORVASC) 5 MG TABLET    Take 5 mg by mouth daily.   FOLIC ACID (FOLVITE) 1 MG TABLET    Take 1 mg by mouth daily.   FUROSEMIDE (LASIX) 20 MG TABLET    Take 20 mg by mouth daily.   GEMFIBROZIL (LOPID) 600 MG TABLET    Take 600 mg by mouth 2 (two) times daily before a meal.   HYDRALAZINE (APRESOLINE) 100 MG TABLET    Take 100 mg by mouth 3 (three) times daily.   ISOSORBIDE MONONITRATE (IMDUR) 60 MG 24 HR TABLET    Take 60 mg by mouth daily.   LOVASTATIN (MEVACOR) 40 MG TABLET    Take 40 mg by mouth at bedtime.   METOPROLOL SUCCINATE (TOPROL-XL) 50 MG 24 HR TABLET    Take 50 mg by mouth daily. Take with or immediately following a meal.   MICONAZOLE NITRATE (LOTRIMIN AF) 2 % AERO    Apply topically 2 (two) times daily.   MULTIPLE VITAMIN TABLET    Take 1 tablet by mouth daily.   PANTOPRAZOLE (PROTONIX) 40  MG TABLET    Take 40 mg by mouth 2 (two) times daily.    PAROXETINE (PAXIL) 30 MG TABLET    Take 30 mg by mouth daily.   PILOCARPINE (PILOCAR) 1 % OPHTHALMIC SOLUTION    Place 3 drops into both eyes daily.   POTASSIUM CHLORIDE SA (K-DUR,KLOR-CON) 20 MEQ TABLET    Take 20 mEq by mouth daily.  Modified Medications   No medications on file  Discontinued Medications   No medications on file     Physical Exam: Filed Vitals:   01/26/14 1555  BP: 128/64  Pulse: 84  Temp: 98 F (36.7 C)  Resp: 18  SpO2: 96%    General- elderly female in no acute distress Head- atraumatic, normocephalic Eyes- PERRLA, EOMI, no pallor, no icterus, no discharge Neck-  no lymphadenopathy, no thyromegaly Throat- moist mucus membrane, normal oropharynx Cardiovascular- normal s1,s2, murmur present Respiratory- bilateral clear to auscultation, no wheeze, no rhonchi, no crackles, no use of accessory muscles Abdomen- bowel sounds present, soft, non tender Musculoskeletal- able to move all 4 extremities, right sided strength 4/5 and left side 5/5, right sided UE abduction limited and cant do overhead abduction, using wheelchair and walker, trace leg edema Neurological- no focal deficit, alert Psychiatry- normal mood and affect    Labs reviewed: 01-15-14: wbc 14.9; hgb 8.0; hct 25.5; mcv 96; plt 271  glucose 179; bun 123; creat 2.11; k+4.1; na++140 01-16-14: wbc 9.2; hgb 7.3; hct 22.3 ;mcv 97; plt 190  glucose 98; bun 103; creat 1.81; k+3.3; na++145   01-17-14: wbc 6.8; hgb 8.2; hct 24.7; mcv 95; plt 181   01-19-14: glucose 215; bun 39; creat 1.37; k+4.0; na++ 142   Radiological Exams: 01-15-14: chest x-ray: 1. Stable cardiomegaly no chf. cardiac pacemaker in stable position. 2. No acute cardiopulmonary disease.   01-15-14: ct of head: 1. No intracranial hemorrhage or definite acute infarct identified. 2. Chronic left frontal lobe infarct.   01-16-14 EKG: accelerated junction rhythm has replaced sinus rhythm from ekg of 08-30-2013  12/2013: EGD: 2 ulcers in the duodenal bulb with clean base.     Assessment/Plan  Generalized weakness From her recent gi bleed. Continue protonix 40 mg twice daily. Will have patient work with PT/OT as tolerated to regain strength and restore function.  Fall precautions are in place.  Duodenal ulcer Causing gi bleed. She is s/p transfusion. Continue protonix 40 mg bid, hold aspirin and plavix until seen by cardiology  Anemia Post bleeding. Is s/p transfusion. Recheck h&h next lab  Constipation She used to take colace at home and it worked. Here on colace 100 mg po daily for now. Change this to senna s 1 tab daily and  reassess  Hyperglycemia Likely from being on steroids. Check a1c to rule out DM given her high risk  HTN Stable, continue norvasc 5 mg daily with toprol xl 50 mg daily and apresoline 100 mg three times daily  CAD Remains chest pain free. Continue toprol xl and imdur. Continue to hold asa and plavix. F/u with cardiology   Hyperlipidemia: continue mevacor 40 mg daily and lopid 600 mg twice daily  Depression continue paxil 30 mg daily   Gout Recent acute flare and has completed her prednisone. Remains pain free. Monitor clinically. Will have her on allopurinol 100 mg daily for chronic management of gout  Family/ staff Communication: reviewed care plan with patient and nursing supervisor   Goals of care: short term rehabilitation    Labs/tests ordered: cbc, cmp, a1c  Blanchie Serve, MD  [facility] [phone] (Monday-Friday 8 am - 5 pm) [phone] (afterhours)

## 2014-01-29 ENCOUNTER — Non-Acute Institutional Stay (SKILLED_NURSING_FACILITY): Payer: Medicare Other | Admitting: Adult Health

## 2014-01-29 DIAGNOSIS — K922 Gastrointestinal hemorrhage, unspecified: Secondary | ICD-10-CM

## 2014-01-29 DIAGNOSIS — I10 Essential (primary) hypertension: Secondary | ICD-10-CM

## 2014-01-29 DIAGNOSIS — I509 Heart failure, unspecified: Secondary | ICD-10-CM

## 2014-01-29 DIAGNOSIS — E876 Hypokalemia: Secondary | ICD-10-CM

## 2014-02-08 NOTE — Progress Notes (Signed)
Patient ID: Sally Curry, female   DOB: Feb 09, 1941, 73 y.o.   MRN: [id number]     [facility]  No Known Allergies   Chief Complaint  Patient presents with  . Discharge Note    HPI:  She is being discharged to home with home health for pt/ot/aid. She will not need dme. She will need her prescriptions to be written and will need a bmp on 02-02-14. She will need follow up with her pcp.    Past Medical History  Diagnosis Date  . CAD (coronary artery disease)   . Hypertension   . Stroke   . Gout   . PVD (peripheral vascular disease)   . Anemia   . Chronic kidney disease   . Anxiety   . CHF (congestive heart failure)   . Depression   . Glaucoma     Past Surgical History  Procedure Laterality Date  . Coronary angioplasty with stent placement    . Pacemaker insertion    . Cholecystectomy    . Carotid endarterectomy    . Percutaneous placement intravascular stent cervical carotid artery    . Appendectomy      VITAL SIGNS BP 135/76  Pulse 90  Ht 5' (1.524 m)  Wt 201 lb (91.173 kg)  BMI 39.26 kg/m2   Patient's Medications  New Prescriptions   No medications on file  Previous Medications   AMLODIPINE (NORVASC) 5 MG TABLET    Take 5 mg by mouth daily.   FOLIC ACID (FOLVITE) 1 MG TABLET    Take 1 mg by mouth daily.   FUROSEMIDE (LASIX) 20 MG TABLET    Take 20 mg by mouth daily.   GEMFIBROZIL (LOPID) 600 MG TABLET    Take 600 mg by mouth 2 (two) times daily before a meal.   HYDRALAZINE (APRESOLINE) 100 MG TABLET    Take 100 mg by mouth 3 (three) times daily.   ISOSORBIDE MONONITRATE (IMDUR) 60 MG 24 HR TABLET    Take 60 mg by mouth daily.   LOVASTATIN (MEVACOR) 40 MG TABLET    Take 40 mg by mouth at bedtime.   METOPROLOL SUCCINATE (TOPROL-XL) 50 MG 24 HR TABLET    Take 50 mg by mouth daily. Take with or immediately following a meal.   MICONAZOLE NITRATE (LOTRIMIN AF) 2 % AERO    Apply topically 2 (two) times daily.   MULTIPLE VITAMIN TABLET    Take 1 tablet by mouth  daily.   PANTOPRAZOLE (PROTONIX) 40 MG TABLET    Take 40 mg by mouth 2 (two) times daily.    PAROXETINE (PAXIL) 30 MG TABLET    Take 30 mg by mouth daily.   PILOCARPINE (PILOCAR) 1 % OPHTHALMIC SOLUTION    Place 3 drops into both eyes daily.   POTASSIUM CHLORIDE SA (K-DUR,KLOR-CON) 20 MEQ TABLET    Take 20 mEq by mouth daily.  Modified Medications   No medications on file  Discontinued Medications   No medications on file    SIGNIFICANT DIAGNOSTIC EXAMS   01-15-14: chest x-ray: 1. Stable cardiomegaly no chf. cardiac pacemaker in stable position. 2. No acute cardiopulmonary disease.   01-15-14: ct of head: 1. No intracranial hemorrhage or definite acute infarct identified. 2. Chronic left frontal lobe infarct.   01-16-14 EKG: accelerated junction rhythm has replaced sinus rhythm from ekg of 08-30-2013  12/2013: EGD: 2 ulcers in the duodenal bulb with clean base.   LABS REVIEWED:  01-15-14: wbc 14.9; hgb 8.0; hct 25.5; mcv  96; plt 271  glucose 179; bun 123; creat 2.11; k+4.1; na++140 01-16-14: wbc 9.2; hgb 7.3; hct 22.3 ;mcv 97; plt 190  glucose 98; bun 103; creat 1.81; k+3.3; na++145  01-17-14: wbc 6.8; hgb 8.2; hct 24.7; mcv 95; plt 181  01-19-14: glucose 215; bun 39; creat 1.37; k+4.0; na++ 142  01-27-14: wbc 9.4; hgb 9.0; hct 0.3; mcv 100; plt 228 glucose 92; bun 45; creat 1.4; k+3.3; na++ 142; liver normal albumin 3.8; hgb a1c 5.8    Review of Systems  Constitutional: Negative for malaise/fatigue.  Respiratory: Negative for cough and shortness of breath.   Cardiovascular: Negative for chest pain, palpitations and leg swelling.  Gastrointestinal: Negative for heartburn, abdominal pain, diarrhea and constipation.  Musculoskeletal: Negative for joint pain and myalgias.  Skin: Negative.   Neurological: Negative for headaches.  Psychiatric/Behavioral: Negative for depression. The patient is not nervous/anxious.       Physical Exam  Constitutional: She appears well-developed and  well-nourished. No distress.  obese  Neck: Neck supple. No JVD present. No thyromegaly present.  Cardiovascular: Normal rate, regular rhythm and intact distal pulses.   Respiratory: Effort normal and breath sounds normal. No respiratory distress. She has no wheezes.  GI: Soft. Bowel sounds are normal. She exhibits no distension. There is no tenderness.  Musculoskeletal: She exhibits no edema.  Has trace lower extremity edema present.  Has right sided hemiplegia present from old cva. Is able to move left sided extremities.    Neurological: She is alert.  Skin: Skin is warm and dry. She is not diaphoretic.      ASSESSMENT/ PLAN:  Will discharge to home with home health for pt/ot/aid to improve upon gait strength and independence with alds. Will need aid for adl care. She is due for a bmp on 02-02-14. She will not need any dme. Her prescriptions have been written for 30 days. She has a follow up appointment with her pcp: Dr. Thedore Mins 02-05-14 at 3 pm.     Time spent with patient 40 minutes.    Synthia Innocent NP [facility]  Contact [phone] Monday through Friday 8am- 5pm  After hours call [phone]

## 2014-03-20 ENCOUNTER — Ambulatory Visit: Payer: Medicare Other

## 2014-09-18 NOTE — Discharge Summary (Signed)
PATIENT NAME:  Sally Curry, Sally MR#:  [id number] DATE OF BIRTH:  12-10-1940  DATE OF ADMISSION:  09/25/2012 DATE OF DISCHARGE:    PRESENTING COMPLAINT: Weight gain and shortness of breath.   DISCHARGE DIAGNOSES: 1.  Generalized anasarca.  2.  Weight gain with shortness of breath, suspected due to mild acute congestive heart failure exacerbation diastolic, improved.  3.  Generalized weakness and deconditioning.  4.  Chest pain, appears atypical.  5.  Type 2 diabetes, sugar well controlled, not on medications. 6.  Chronic renal insufficiency. Creatinine 1.5.  7.  Coronary artery disease with stents in the past.  8.  Morbid obesity.   CONDITION ON DISCHARGE: Fair.   CODE STATUS: No code, DO NOT RESUSCITATE.   LABORATORY AND DIAGNOSTIC DATA: Glucose is 86, BUN 53, creatinine 1.51, sodium 142, potassium 3.3, chloride 108, albumin 2.8. LFTs within normal limits. Hemoglobin and hematocrit is 10.3 and 31.2. White count is 7.3. CEA is 3.3, CA-125 is 272. Echo Doppler showed ejection fraction of 70% to 75%.  Troponin x 3 were negative.  Lipid profile within normal limits. Magnesium is 1.9. Hemoglobin A1c is 5.2. B-type natriuretic peptide was 5367. Chest x-ray showed cardiomegaly. Cardiac pacer with tip in the right atrium.   CONSULTATIONS: 1.  Dr. Lady GaryFath, cardiac consultation. 2.  Dr. Lorre NickGittin, oncology consultation.  MEDICATIONS AT DISCHARGE: 1.  Nitroglycerin 0.4 mg sublingual as needed.  2.  Norco 5/325 1 tablet p.r.n.  3.  Aspirin 325 mg p.o. daily.  4.  Nystatin apply to abdominal folds and breast folds b.i.d.  5.  Lasix 20 mg b.i.d.  6.  Calcium carbonate 500 mg daily.  7.  Imdur extended-release 60 mg daily.  8.  Allopurinol 100 mg daily.  9.  Gemfibrozil 600 mg b.i.d.  10.  Norvasc 5 mg daily.  11.  Aspirin 81 mg daily.  12.  Multivitamin p.o. daily.  13.  Hydralazine 100 mg t.i.d.  14.  Protonix 40 mg daily.  15.  Pilocarpine 1% ophthalmic drops, 3 drops both eyes at bedtime.  16.   Potassium chloride extended-release 28 mEq daily.  17.  Senokot-S at 1 tablet at bedtime.  18.  Toprol-XL 50 mg daily.  19.  Plavix 75 mg daily.  20.  Mevacor 40 mg with supper.  21.  Paxil 30 mg at bedtime.  22.  Folic acid p.o. daily.  23.  A 2 gram sodium diet.  24.  Physical therapy.  25.  Skin care.   FOLLOW UP:   1.  Follow up with Dr. Lady GaryFath cardiology in 3 weeks.  2.  Follow up with Dr. Lorre NickGittin in 2 to 3 weeks.   BRIEF SUMMARY OF HOSPITAL COURSE:  The patient is a 74 year old Caucasian female with history of coronary artery disease, hypertension, and type 2 diabetes with morbid obesity. [place] was recently [place] [facility]. She was sent to the Emergency Room for increasing shortness of breath, weight gain and increased abdominal girth. She was admitted with:   1.  Shortness of breath with significant weight gain and pedal edema. This is suspected from acute exacerbation of her CHF, likely diastolic. She was seen by cardiology. We diuresed her very well and she had a urine output about 9 liters; during the hospital she was down by 10 pounds on her weight.  Her echo was essentially negative with ejection fraction of 65%. She was seen by Dr. Lady GaryFath who recommended continue medical management at this time. Her hypoxia resolved  and her sats were more than 92% on room air.  2.  Ascites with generalized anasarca noted on CT of the abdomen and pelvis. Ultrasound done of the pelvis, done in [place] in April 2004 was negative. The patient has undergone hysterectomy with bilateral ovaries removed. Her CA-125 was elevated; significance still remains unclear. She was seen by Dr. Lorre Nick and follow up will be done as outpatient.  The patient's ascites was too small for tap given her body habitus; hence, her paracentesis was canceled.   3.  Severe weakness. The patient was seen by PT, she will continue her resume PT at [facility]. 4.  Type 2 diabetes. Sugars are well  controlled, not on any medications.  Her A1c is 5.2, diet controlled. 5.  Chronic renal insufficiency. Creatinine stayed around 1.6.  She was given IV Lasix initially and was discharged on 20 mg b.i.d.  She was on high-dose of  80 mg at home. However, I  decreased her dose of 20 mg b.i.d. at this time should she was diuresed very well in the hospital.  6.  Coronary artery disease with stents in the past. Her aspirin and Plavix will be resumed. She is on metoprolol and p.r.n. nitroglycerin.  7.  The patient overall showed remarkable improvement.  I did speak with her son on the phone. The patient will be discharged back to [facility] today.   CODE STATUS: No code DO NOT RESUSCITATE.   Time spent:  40 minutes.   ____________________________ Wylie Hail Allena Katz, MD sap:ct D: 09/28/2012 07:43:04 ET T: 09/28/2012 08:44:28 ET JOB#: [id number]  cc: Phares Zaccone A. Allena Katz, MD, <Dictator> Willow Ora MD ELECTRONICALLY SIGNED 10/15/2012 15:19

## 2014-09-18 NOTE — Consult Note (Signed)
PATIENT NAME:  Sally, Curry MR#:  [id number] DATE OF BIRTH:  1941/01/22  DATE OF CONSULTATION:  09/26/2012  REFERRING PHYSICIAN:   CONSULTING PHYSICIAN:  Knute Neu. Lorre Nick, MD  HISTORY OF PRESENT ILLNESS:  Sally Curry is a 74 year old patient, who was admitted on April 30th. She was admitted with chest pain, MI ruled out, admitted with CHF and fluid retention and weakness and shortness of breath. She had vigorous diuresis, over 9 liters, with improvement in symptoms. She had cardiology evaluation and echo. She had a normal echo, but was felt to have diastolic congestive heart failure.   Oncology was consulted because of a prior history of and current identification of ascites and elevated CA-125 and a concern of peritoneal carcinomatosis. Essentially, the patient, who had recently moved here from [place], was supposed to go to a  home, but came to the hospital directly for symptoms, which are improved. Had been identified, but did not complete a workup while at home for increasing abdominal girth, weight gain, and the concern about intra-abdominal cancer, having abdominal pain and cramps, but no diarrhea, no bleeding. Had weight gain, up to 18 pounds weight gain recently. History of coronary artery disease, MIs, stents in the carotid artery, carotid endarterectomy, stroke, right-sided weakness, hypertension, renal insufficiency, glaucoma. It was uncertain if the stents were carotid artery stents or cardiac stenting. History also included a hysterectomy and oophorectomy, appendectomy, and tonsillectomy.   ALLERGIES:  No known drug allergies.   MEDICATIONS:  At home was on, Senna and Tylenol p.r.n. Was on Toprol-XL 50 daily and Protonix 40 daily. Paxil 20 daily, Lasix 80 daily, potassium 20 daily, lovastatin 40 daily, Plavix 75 daily, Benadryl p.r.n., aspirin 81 daily, amlodipine 5 mg daily, allopurinol 100 daily.   SOCIAL HISTORY:  No alcohol or tobacco.   FAMILY HISTORY:  Negative for  malignancy.   ADDITIONAL REVIEW OF SYSTEMS:  When I spoke to the patient, she was alert and cooperative, but had cognitive impairment and memory loss. She did not know much details of her history. Was aware of she thought about a 4-pound weight gain. She did not know about any abdominal problems. She did not have any headache or dizziness. No fever, chills, or sweats. She was aware of gaining weight and getting some swelling and of fatigue. No visual disturbance. No ear or jaw pain. She was not having chest pain at that time. No palpitations. Was no longer short of breath. No nausea, no vomiting, no diarrhea. No dysuria or hematuria. No history of anemia that she was aware of, but this is stated in her admission history. Denied bleeding or bruising. Did not have skeletal pain. Had some weakness on one side. Was not anxious.   PHYSICAL EXAMINATION:  Her exam was notable for her being alert, cooperative, some memory lapses. She did have some right-sided weakness. No adenopathy in the neck, supraclavicular, submandibular, axilla. Lungs: There is decreased air entry. There were no rales or wheezing at the time of my exam. The abdomen was nontender. No palpable mass or organomegaly. The abdomen had brawny edema in the abdominal wall, particularly the lower abdomen, and a thick pannus of skin in the lower abdomen, but no definite ascites clinically. There were no rashes. There was lower extremity edema symmetrically.   LABORATORIES:  On admission, the creatinine was 1.89. The liver chemistries were normal. White count was 9.5, hemoglobin 10.5, platelets 235. Chest x-ray showed pulmonary edema. The admission cardiogram was unremarkable. The creatinine had come down to 1.65.  Abdominal ultrasound showed no hydronephrosis and no ascites. There was no hydronephrosis.   PLAN:  The patient was seen and then followed up. At the time the patient was seen, the plan was after discussion with family members, who were not  immediately available, pursue CT scan and then potentially ultrasound-guided thoracentesis. This could not be done during hospitalization. There was only minimal fluid on ultrasound. The tumor markers were reviewed, and then serial tumor markers were planned with either repeat ultrasound for paracentesis or laparoscopic evaluation later, as the presentation is highly suspicious of a possible intra-abdominal cancer or peritoneal carcinomatosis  the patient is status post oophorectomy. It was considered that potentially there could be nonmalignant causes or inflammatory causes leading to the current findings. Also, with regard to anemia, workup was planned and recommended with iron studies and ferritin, plus the tumor markers, and then potentially electrophoresis studies if azotemia persisted.     ____________________________ Knute Neuobert G. Lorre NickGittin, MD rgg:ms D: 10/21/2012 22:35:15 ET T: 10/21/2012 23:00:36 ET JOB#: [id number]  cc: Knute Neuobert G. Lorre NickGittin, MD, <Dictator> Marin RobertsOBERT G Leydy Worthey MD ELECTRONICALLY SIGNED 11/04/2012 22:13

## 2014-09-18 NOTE — H&P (Signed)
PATIENT NAME:  Sally Curry, Sally Curry MR#:  [id number] DATE OF BIRTH:  [birth year]  DATE OF ADMISSION:  09/25/2012  PRIMARY CARE PHYSICIAN: [place] of state.   REFERRING PHYSICIAN: Dr. Cyril Loosen.   CHIEF COMPLAINT: Chest pain and shortness of breath.   HISTORY OF PRESENT ILLNESS: The patient is a 74 year old, pleasant, Caucasian female with past medical history of coronary artery disease, status post MI, status post 2 stents in the past. Has been getting worked up for possible peritoneal cancer in [place] where she used to live. Has been moved to [place] to live close to her daughter, who lives in [place], at [facility] nursing home. When the patient went to get admitted to the nursing home, they noticed her being short of breath. As the patient admitted shortness of breath associated with chest pain, she was sent over to the ER for further evaluation. The patient is reporting that she has been having intermittent episodes of chest pain for the past 1 to 2 days which is sharp in nature and 4 out of 10. The chest pain is diffusely present throughout the anterior chest wall. Denies any radiation or nausea or vomiting. This is associated with shortness of breath and 13 to 18 pounds of weight gain in the past 3 weeks. The patient is also reporting that the chest pain gets worse with any kind of movement and is getting eased off while she is resting. The patient was getting worked up for possible peritoneal cancer at [place], and she needs further evaluation with colonoscopy and other investigations to be done as reported by the patient. At this time, it is still nonconclusive and it needs further evaluation. The patient and her son at bedside are reporting that her abdominal girth is increasing, and lately, her lower extremities are swollen. The patient is becoming short of breath for the past 2 to 3 weeks, since she has gained 13 to 18 pounds. The patient has insulin-dependent diabetes mellitus.  Lately, she was not taking any long-acting insulin as her blood sugars are running low. The patient denies any dizziness or loss of consciousness.   PAST MEDICAL HISTORY: History of coronary artery disease, status post MI and 2 stents, hypertension, old history of stroke, chronic renal insufficiency, glaucoma.    PAST SURGICAL HISTORY: Appendectomy, tonsillectomy, right carotid endarterectomy and stent placement, adenoidectomy, hysterectomy.   ALLERGIES: The patient has no known drug allergies.   HOME MEDICATIONS: Tylenol 650 mg q.8 hours, Toprol-XL 50 mg once daily, Senna 8.6 mg 1 tablet p.o. once daily, Protonix 40 mg once daily, Paxil 20 mg once daily, multivitamin 1 tablet once daily, Lasix 80 mg 1 tablet once daily, potassium chloride 20 mEq once daily, lovastatin 40 mg once daily, Plavix 75 mg once daily, Benadryl 25 mg 1 capsule p.o. 3 times a day, aspirin 81 mg once daily, amlodipine 5 mg once daily, allopurinol 100 mg 1 tablet once a day.   PSYCHOSOCIAL HISTORY: She just has moved from [place] to [place] where her daughter lives. She is going to reside in [facility] nursing home after she gets discharged from the hospital. No history of smoking, alcohol or illicit drug usage.   FAMILY HISTORY: Mom had a history of hypertension, and dad had a history of diabetes mellitus.  REVIEW OF SYSTEMS:  CONSTITUTIONAL: Denies any fever. Complaining of fatigue and weight gain. Complaining of diffuse anterior chest wall pain with movement.  EYES: No blurry vision. Has positive history of glaucoma. Denies any  eye pain or redness.  ENT: Denies any epistaxis, discharge, snoring or difficulty in swallowing.  RESPIRATION: No cough or wheezing but complaining of shortness of breath. Denies any COPD.   CARDIOVASCULAR: Complaining of anterior chest wall tenderness on palpation. Denies any syncope or palpitations.  GASTROINTESTINAL: No nausea, vomiting, diarrhea but complaining of increase in  abdominal girth for the past 2 to 3 weeks with the suspicion for peritoneal cancer. Denies any melena or hematemesis.  GENITOURINARY: No dysuria or hematuria.  GYNECOLOGIC AND BREASTS: No breast mass or vaginal discharge.  ENDOCRINE: No polyuria, nocturia, thyroid problems.  HEMATOLOGIC AND LYMPHATIC: Has chronic history of anemia but no easy bruising or bleeding.  MUSCULOSKELETAL: Complaining of left elbow redness and pain. Has positive history of gout. Denies any shoulder pain or back pain.  NEUROLOGIC: Old history of stroke is present but no ataxia or dementia. Denies any tremors or vertigo.  PSYCHIATRIC: No insomnia, ADD, OCD.   PHYSICAL EXAMINATION:  VITAL SIGNS: Temperature 97.9, pulse 62, respirations 20 to 22, blood pressure is 136/56 but during my examination, the systolic blood pressure was at 95 to 100, pulse ox 97% on room.  GENERAL APPEARANCE: Not in acute distress. Moderately built and moderately nourished.  HEENT: Normocephalic, atraumatic. Pupils are equally reacting to light and accommodation. No scleral icterus. Extraocular movements are intact. No conjunctival injection. No sinus tenderness. No postnasal drip. No pharyngeal exudates.  NECK: Supple. No JVD. No thyromegaly. Range of motion is intact. Status post carotid endarterectomy.  LUNGS: Minimal rales are present at the bases. No wheezing. No crackles. No accessory muscle usage. Positive anterior chest wall tenderness.  CARDIAC: S1, S2 normal. Regular rate and rhythm. No murmurs  GASTROINTESTINAL: Soft, obese. Bowel sounds are positive in all 4 quadrants. Minimal discomfort is present on palpation. No hepatosplenomegaly. No CVA tenderness.  NEUROLOGIC: Awake, alert, oriented x3. Motor and sensory are grossly intact. Cranial nerves II through XII are intact. Reflexes are 2+.  MUSCULOSKELETAL: Positive left elbow erythema, tenderness and edema. Range of motion is slightly restricted from the erythema and tenderness. The rest of  the joints in the body no effusion, no tenderness.  SKIN: No lesions. No rashes. Warm to touch. Normal turgor.  EXTREMITIES: Positive 3+ edema is present. Peripheral pulses are 1 to 2+. No cyanosis. No clubbing.  PSYCHIATRIC: Normal mood and affect.   LABS AND IMAGING STUDIES: Glucose 136. BNP 5367. BUN 60, creatinine 1.89, sodium 139, potassium 3.7, chloride 106, CO2 24. GFR 26. Anion gap is 9. Serum osmolality 297. Calcium 9.3. LFTs are within normal range except albumin which is at 3.3. Troponin less than 0.02. WBC 9.5, hemoglobin 10.5, hematocrit 31.6, platelet count is 235. CHEST X-RAY: Cardiomegaly with very mild pulmonary edema at the bases. Intact pacemaker. A 12-lead EKG has revealed normal sinus rhythm with low voltage at 60 beats per minute. Normal PR and QRS intervals. No ST-T wave changes.   ASSESSMENT AND PLAN: A 74 year old Caucasian female just moved from [place] to [facility] in [place]. She is sent over to the ER for shortness of breath and chest pain for 2 days. The patient is complaining of approximately 15 to 18 pounds weight gain in the past 2 to 3 weeks and her abdominal girth is increasing with a high suspicion for peritoneal cancer as reported by the patient and her son at bedside. She had some workup done at [place] and the results of which need to be obtained for further evaluation and investigations  to be done here in [place].   1. Atypical chest pain: Probably musculoskeletal. Will cycle cardiac biomarkers. Will implement acute coronary syndrome protocol, but given the low blood pressure, will hold off on the beta blocker for hypotension. Cardiology consult is placed to on call cardiologist, Dr. Juliann Paresallwood. Will obtain 2-D echocardiogram to evaluate her ejection fraction.  2. Shortness of breath with significant weight gain and pedal edema: Probably from peritoneal cancer which is questionable versus acute exacerbation of congestive heart  failure. Will provide her Lasix if the blood pressure is stable. Will obtain echocardiogram. Cardiology consult is placed to Dr. Juliann Paresallwood, and also oncology consult is placed to on call oncology group for further investigations and workup. Will try to obtain medical records from [place].  3. History of insulin-dependent diabetes mellitus: Currently, the patient will be on sliding scale insulin as lately she has been having hypoglycemia episodes.  4. Probably acute on chronic renal insufficiency: We are holding intravenous fluids for now in view of possible congestive heart failure exacerbation and shortness of breath. Will monitor renal function closely and if necessary, nephrology consult will be placed.  5. 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  6. Will provide her gastrointestinal and deep vein thrombosis prophylaxis with heparin and ranitidine.   She is DNR. The patient's son, Mr. Berle MullRandy Haith, is the medical POA. His contact phone number is [phone].   The diagnosis and plan of care was discussed in detail with the patient and her son and daughter at bedside. They all verbalized understanding of the plan. All of their questions were answered to their satisfaction.   TOTAL TIME SPENT ON ADMISSION: 50 minutes.    ____________________________ Ramonita LabAruna Arionna Hoggard, MD ag:gb D: 09/25/2012 00:42:48 ET T: 09/25/2012 01:22:26 ET JOB#: [id number]  cc: Ramonita LabAruna Delonte Musich, MD, <Dictator> Dwayne D. Juliann Paresallwood, MD Sandeep R. Sherrlyn HockPandit, MD Ramonita LabARUNA Alexey Rhoads MD ELECTRONICALLY SIGNED 09/27/2012 5:37

## 2014-09-18 NOTE — Consult Note (Signed)
General Aspect 74 yo female admitted with progressive shortness of breath and increasing abdominal girth and weight gain over the pst several weeks to month. She moved from [place] to [place] last night and was broought directly to the er where she was admitted. She has had increasing abdominal girth and discomfort. Wouk up in [place] was initiated and she was felt to have possible peritoneal cancer vs other abdominal malignacy. She has a cardiac history to include history of pci as well as ppm for apparent sick sinus syndrome. This [place] area and records are currently not available. She has ruled out for an mi thus far by cardiac markers. Echo reveals normal lv funciotn wtih lvh. Pt is a difficult historian and most of the history is from family.   Physical Exam:  GEN obese   HEENT PERRL, hearing intact to voice   NECK supple   RESP normal resp effort   CARD Regular rate and rhythm  Bradycardic  Murmur   Murmur Systolic   Systolic Murmur axilla   ABD positive tenderness  distended   LYMPH negative neck, negative axillae   EXTR negative cyanosis/clubbing, positive edema   SKIN normal to palpation   NEURO cranial nerves intact, motor/sensory function intact   PSYCH alert, poor insight   Review of Systems:  Subjective/Chief Complaint weakness, shortness of breath and abdominal pain with increasing abdominal girth.   General: Weight loss or gain  Fatigue   Skin: left lower quadrant abdominal ecchymosis   ENT: No Complaints   Eyes: No Complaints   Neck: No Complaints   Respiratory: Short of breath   Cardiovascular: Chest pain or discomfort  Dyspnea  Edema   Genitourinary: No Complaints   Vascular: No Complaints   Musculoskeletal: No Complaints   Neurologic: No Complaints   Hematologic: No Complaints   Endocrine: No Complaints   Psychiatric: No Complaints   Review of Systems: All other systems were reviewed  and found to be negative   Medications/Allergies Reviewed Medications/Allergies reviewed   EKG:  EKG NSR   Interpretation sinsu bradycaardia    No Known Allergies:    Impression 74 yo female wtih history of cad and carotid disease by family report as well as aparent sick sinus syndrome with dual chamber ppm. Recently moved to [place] form [place] where she received all of her prior medical care including recent workup underway for increasing abdominal girth, discomfort and weight gain. Felt to have a possible malignancy in the abdomen. SHe was scheduled for a colonoscopy which was cancelled due to her move to [place] last night. She has no evidence of chf on cxr and echo reveales preserved lv funciton with lvh. Etilogy of her increased abdominal girth is unclear. She will need colonscopy and further evaluation of possible etiology of her abdominal symptoms. Will attempt to obtain records regardin her prior cardiac and other workup doen in [place].  She has ruled out for an mi   Plan 1. Conintue current cardiac meds with the exception of consideration for holding plavix due to pending colonscopy. Will need to obtain records form [place] regarding time and type of coronary and carotid stenting prior to hold plavix 2. Contine work up of her abdomnial complaints.3 Weight loss 4. Will follow with you   Electronic Signatures: Dalia HeadingFath, Oneisha Ammons A (MD)  (Signed 30-Apr-14 21:25)  Authored: General Aspect/Present Illness, History and Physical Exam, Review of System, EKG , Allergies, Impression/Plan   Last Updated: 30-Apr-14  21:25 by Dalia Heading (MD)

## 2014-09-18 NOTE — Consult Note (Signed)
Brief Consult Note: Diagnosis: CHF, ANEMIA, POSSIBLE INTRA ABDOMINAL CANCER.   Patient was seen by consultant.   Comments: DICTATED NOTE TO FOLLOW  PATIENT SEEN CHART REVIEWED NO FAMILY PRESENT AT THIS TIME HX FROM CHART, LIMITED FROM PATIENT . SHE IS ALERT AND COOPERATIVE BUT APPERARS TO HAVE DEMENTIA AND MEMORY LOSS, AS HAS NO KNOWLEDGE OF PROBLEMS AT HOME, WAS AWARE OF 4 POUND WEIGHT GAIN, KNOWS NOTHING ABOUT POSSIBLE CANCER OR ABDOMINAL PROBKLEMS OR COLONOSCOPY. HAS NO KNOWLEDGE OF HX OF MI. DOES REPORT OLD STROKE AND UNABLE TO WALK.  ON EXAM, RIGHT SIDED WEAKNESS, PROTUBERANT ABDOMIANL PANNUS, WITH THICKENING SKIN LOWER ABDOMEN, NON TENDER. LABS CRE 1.65 AND HGB 10.3. ABDO U/S NO HYDRONEPHROSIS, NO ASCITES   PLAN  COULD WAIT ANOTHER DAY FOR OLD RECORDS, W/U WILL NEED TO INCLUDE, IRON STUDIES AND FERRITIN, CEA AND CA 125, CT SCAN ABDOMEN AND PELVIS , THEN DEPENDING ON RESULTS POSSIBLE COLONOSCOPY . ALSO IF CRE DOES NOT IMPROVE WOULD ADD ELECTROPHERESIS STUDIES DUE TO ANEMIA AND AZOTEMIA. I WILL FOLLOW. DICTATED NOTE TO FOLLOW.  Electronic Signatures: Marin RobertsGittin, Roschelle Calandra G (MD)  (Signed 01-May-14 09:00)  Authored: Brief Consult Note   Last Updated: 01-May-14 09:00 by Marin RobertsGittin, Tinaya Ceballos G (MD)

## 2014-09-19 NOTE — Consult Note (Signed)
PATIENT NAME:  Sally Curry, Sally Curry MR#:  [id number] DATE OF BIRTH:  [birth year]  DATE OF CONSULTATION:  01/16/2014  REFERRING PHYSICIAN:  Leotis Shames, MD  CONSULTING PHYSICIAN:  Dow Adolph, MD  REASON FOR CONSULTATION: Melena, anemia.   HISTORY OF PRESENT ILLNESS: Sally Curry is a 75 year old female with a past medical history notable for CVA, peripheral vascular disease, coronary artery disease, status post stenting, CHF, currently on Plavix who presented to the Emergency Room yesterday for evaluation of altered mental status and weakness. During the evaluation of these symptoms, it came to light that she had been also been having black bowel movements at home. She also had a new anemia with a hemoglobin of 8 compared to 11 several months ago.   In talking to Sally Curry today, she seems a bit confused, but is able to somewhat give a history. She states that for about the past week she has been having 1 black bowel movement per day. She cannot recall ever having prior history of GI bleeding. She denies taking any over-the-counter NSAIDs. She also denies any trouble with heartburn or acid reflux.   In the hospital, she reports having 1 black bowel movement last night, none today. She has been hemodynamically stable. Her hemoglobin has trended down slightly while she is in the hospital currently to 7.5. GI is consulted for further evaluation.   PAST MEDICAL HISTORY: 1.  Hypertension.  2.  CVA.  3.  Peripheral vascular disease.  4.  CAD.  5.  CHF.   6.  Gout.  7.  Hyperlipidemia.  8.  Anxiety.   MEDICATIONS AT HOME:  1.  Allopurinol 100 mg daily.  2.  Norvasc 10 mg daily.  3.  Aspirin 81 mg daily.  4.  Calcium carbonate 500 mg daily.  5.  Plavix 75 mg daily.  6.  Folic acid 1 mg daily.  7.  Lasix 20 mg daily.  8.  Gemfibrozil 600 mg b.i.d.  9.  Hydralazine 100 mg t.i.d.  10.   Imdur 60 mg daily.  11.   Lovastatin 40 mg daily.  12.   Toprol 50 mg daily.  13.   Multivitamin.  14.    Nystatin.  15.   Paxil 30 mg daily.  16.   Potassium 20 mEq daily.  17.   Vitamin D3 at 400 units b.i.d.   FAMILY HISTORY: She is unable to give me her family history at this time. Per the notes, she has a family history of Parkinson disease and hypertension.   SOCIAL HISTORY: She denies any alcohol or recreational drugs to me.   ALLERGIES: NKDA.   REVIEW OF SYSTEMS: Ten-system review is negative at this time; however, it is slightly unreliable at this time.   PHYSICAL EXAMINATION: VITAL SIGNS: Current temperature is 98.1. Pulse is 69, blood pressure 102/61. Respirations are 18. Pulse oximetry is 99% on 2 liters.  GENERAL: Alert and oriented x 2, although she has some difficulty recalling the name of the hospital.  No acute distress. Appears stated age. HEENT: Normocephalic/atraumatic. Extraocular movements are intact. Anicteric. NECK: Soft, supple. JVP appears normal. No adenopathy. CHEST: Clear to auscultation. No wheeze or crackle. Respirations unlabored. HEART: Regular. No murmur, rub, or gallop.  Normal S1 and S2. ABDOMEN: Soft, nondistended.  Some right lower quadrant tenderness to palpation. Positive abdominal adiposity. Normal active bowel sounds in all four quadrants.  No organomegaly. No masses EXTREMITIES: No swelling, well perfused. SKIN: No rash or lesion. Skin color, texture, turgor normal. NEUROLOGICAL:  Grossly intact. PSYCHIATRIC: Normal tone and affect. MUSCULOSKELETAL: No joint swelling or erythema.   RESULTS: Sodium is 145, potassium 3.3, BUN 103, creatinine 1.81, troponin is 0.02. White count is 9.2, hemoglobin is 7.3, hematocrit 22.3, platelets are 190,000. Her INR is 1.2. Lactic acid yesterday was 2.7.   ASSESSMENT AND PLAN: Melena, anemia: This is occurring on Plavix. She is having stable once a day melena.   RECOMMENDATIONS: Given the ongoing melena, the declining hemoglobin and the need for anticoagulation, I would recommend an upper endoscopy in her for  evaluation of the cause of this melena. If this upper endoscopy is negative, we would need to proceed with a colonoscopy and possible capsule endoscopy.   The timing of this is to be determined. In the meantime, would continue her on an IV PPI. I would also recommend a blood transfusion given her hemoglobin is 7.5 in the setting of coronary artery disease, CHF and peripheral vascular disease. Continue to monitor her hemoglobin. Additional recommendations pending the findings of her endoscopy. Thank you for this consult.    ____________________________ Dow AdolphMatthew Rein, MD mr:at D: 01/16/2014 15:20:21 ET T: 01/16/2014 15:43:03 ET JOB#: [id number]  cc: Dow AdolphMatthew Rein, MD, <Dictator> Kathalene FramesMATTHEW G REIN MD ELECTRONICALLY SIGNED 02/01/2014 17:01

## 2014-09-19 NOTE — H&P (Signed)
PATIENT NAME:  Sally, Curry MR#:  [id number] DATE OF BIRTH:  [birth year]  DATE OF ADMISSION:  01/15/2014  PRIMARY CARE PHYSICIAN:  Leotis Shames, MD   CHIEF COMPLAINT:  Lethargy, weakness, and altered mental status.   HISTORY OF PRESENT ILLNESS:  This is a 74 year old female who presented to the hospital, brought in by her daughter due to altered mental status, and confusion, and the daughter also states that she noted black stools in the toilet.  The patient normally lives independently, uses a walker to get around. Normally is alert, and oriented. When the daughter saw her today, she was confused, not answering questions appropriately. She was also quite lethargic, and appeared pale, and brought her to the Emergency Room.  Patient herself is a fairly poor historian. She just says that she feels achy all over. Upon blood work, patient was noted to be anemic with a hemoglobin of 8, previous hemoglobin being around 11, and she was also noted to be in acute on chronic renal failure with a baseline creatinine 1.5, currently elevated at 2, with a BUN over 100.   Hospitalist services were contacted for further treatment and evaluation.   REVIEW OF SYSTEMS:  GENERAL:  No documented fever. No weight gain or weight loss, positive generalized weakness.  EYES:  No blurry or double vision.  ENT:  No tinnitus. No postnasal drip. No redness of the oropharynx.  RESPIRATORY:  No cough, no wheeze, no hemoptysis, positive dyspnea.  CARDIOVASCULAR:  No chest pain, no orthopnea, no palpitations or syncope.  GASTROINTESTINAL:  No nausea, no vomiting, no diarrhea, no abdominal pain; positive melena, no hematochezia.  GENITOURINARY:  No dysuria or hematuria.  ENDOCRINE:  No polyuria or nocturia, heat or cold intolerance.  HEMATOLOGIC:  Positive anemia, no acute bruising, positive bleeding.  INTEGUMENT:  No rashes, no lesions.  MUSCULOSKELETAL:  No arthritis, no swelling, no gout.  NEUROLOGIC:  No numbness,  tingling, no ataxia, no seizure-type activity.  PSYCHIATRIC:  No anxiety, no insomnia, no ADD.   PAST MEDICAL HISTORY:  Consistent with hypertension, history of previous CVA, peripheral vascular disease, history of coronary artery disease, status post stent placement, hyperlipidemia, anxiety, history of CHF, gout.   ALLERGIES:  No known drug allergies.   SOCIAL HISTORY:  No smoking, no alcohol abuse; no illicit drug abuse, lives by herself.   FAMILY HISTORY:  The patient's mother is alive, has Parkinson's disease, and hypertension; father died from complications of emphysema.   CURRENT MEDICATIONS:  Allopurinol 100 mg daily, amlodipine 10 mg daily, aspirin 81 mg daily, calcium carbonate 500 mg daily, Plavix 75 mg daily, folic acid 1 mg daily, Lasix 20 mg daily, gemfibrozil 600 mg b.i.d., hydralazine 100 mg t.i.d., Imdur 60 mg daily, lovastatin 40 mg daily, Toprol 50 mg daily, multivitamin daily, nystatin to be applied to the affected area daily; Paxil 30 mg daily, pilocarpine 1% ophthalmic solution 3 drops daily; potassium 20 mEq daily and vitamin D3 of 400 international units b.i.d.   PHYSICAL EXAMINATION:  VITAL SIGNS:  Presently is as follows, are noted to be temperature 98.1, pulse 74, respirations 24, blood pressure 150/53, sats 99% on room air.  GENERAL:  She is a pale but pleasant-appearing female in no apparent distress.  HEAD AND EYES AND EARS AND NOSE AND THROAT:  She is atraumatic, normocephalic; extraocular muscles are intact; pupils equal, and reactive to light; sclerae is icteric, no conjunctival injection, no pharyngeal erythema.  NECK:  Supple. No jugular venous distention, no bruits, no lymphadenopathy,  no lymphadenopathy.  HEART:  Regular rate, and rhythm. She does have a 2/6 systolic ejection murmur heard at the right sternal border, no rubs, no clicks.  LUNGS:  Clear to auscultation bilaterally. No rales, or rhonchi. No wheezes.  ABDOMEN:  Soft, flat, nontender, nondistended,  has good bowel sounds, no hepatosplenomegaly appreciated.  EXTREMITIES:  No evidence of any cyanosis, or clubbing. She does have trace pedal edema from the knees, and ankles bilaterally, +2 pedal and radial pulses bilaterally.  NEUROLOGIC:  She is alert, awake, and oriented x 1; moves all extremities spontaneously, globally weak, no other focal motor, or sensory deficits appreciated bilaterally.  SKIN: Moist and warm with no rashes appreciated.  LYMPHATIC:  There is no cervical, or axillary adenopathy.   LABORATORY:  Showed a serum glucose of 179, BUN 123, creatinine 2.1, sodium 140, potassium 4.1, chloride 105, bicarbonate 18. Patient's white cell count is 14.9, hemoglobin 8, hematocrit 25.5, platelet count 271,000, INR is 1.2; urinalysis within normal limits.   Patient did have a chest x-ray done which showed no evidence of any acute cardiopulmonary disease. The patient also had a CT of the head done which showed chronic left frontal lobe infarct but no acute hemorrhage, or infarct noted.   ASSESSMENT AND PLAN:  This is a 74 year old female with history of hypertension, history of previous cerebrovascular accident, peripheral vascular disease, coronary artery disease, status post stent placement, hyperlipidemia, anxiety; history of congestive heart failure, gout, presents to the hospital due to altered mental status, and feeling weak, and noted to have melenic stools.  Problem:   1.  Altered mental status. This is likely metabolic encephalopathy due to her anemia and gastrointestinal bleed, and also renal failure. There is no evidence of any acute infectious source. Chest x-ray and urinalysis are negative. Her CT head is negative. The patient has been given empirically vancomycin, and Levaquin in the Emergency Room, although I will not continue antibiotics at this time. I will give her intravenous fluids, transfuse her blood as needed, and follow mental status.  2.  Gastrointestinal bleed. This is  likely due to anemia. This is likely the cause of patient's anemia. The patient is noted to have melenic stools at home; hemoglobin in April was 11, now down to 8. I will follow serial hemoglobins, hold the aspirin, and Plavix; get a gastrointestinal consult. Place her on intravenous Protonix, and a clear liquid diet for now.  3.  Anemia. This is likely acute blood loss anemia from the gastrointestinal bleed. I will transfuse if the hemoglobin falls below 7; hold aspirin, and Plavix for now.  4.  Acute on chronic renal failure, baseline creatinine 1.5, currently elevated at 2, likely due to volume loss or gastrointestinal bleed. I will give her intravenous fluids, transfuse blood as needed, follow BUN, and creatinine.  5.  Hypertension. Patient is hemodynamically stable. I will continue hydralazine, Toprol and Norvasc, and Imdur.  6.  Anxiety. Continue Paxil.  7.  History of congestive heart failure. Clinically, the patient does not appear to be in congestive heart failure. I will hold the patient's Lasix, given the renal failure, and volume loss; continue beta blocker for now.  8.  History of gout, no acute attack. Hold allopurinol, given the renal failure.   The patient is a DO NOT INTUBATE/DO NOT RESUSCITATE.   Plan was discussed with the patient's daughter and she is in agreement.   TIME SPENT:  Is 50 minutes.    ____________________________ Rolly PancakeVivek J. Cherlynn KaiserSainani, MD vjs:nt  D: 01/15/2014 19:44:16 ET T: 01/15/2014 20:04:46 ET JOB#: [id number]  cc: Rolly Pancake. Cherlynn Kaiser, MD, <Dictator> Houston Siren MD ELECTRONICALLY SIGNED 01/24/2014 11:51

## 2014-09-19 NOTE — Discharge Summary (Signed)
Dates of Admission and Diagnosis:  Date of Admission 15-Jan-2014   Date of Discharge 01-Jan-0001   Admitting Diagnosis GI bleed, anemia, metabolic encephalopathy, acute on chronic renal insifficiency   Final Diagnosis GI bleed, anemia, metabolic encephalopathy, acute on chronic renal insifficiency   Discharge Diagnosis 1 GI bleed, anemia   2 chronic renal insufficiency   3 gout   4 HTN   5 CAD, s/p stent    Chief Complaint/History of Present Illness Elderly lady with h/o CAD, s/p stent, s/p PPM, h/o CVA, PVD, presented with altered mental status and dark stools. She was admitted for further work up and management of GI bleed and metabolic encephalopathy.   Allergies:  No Known Allergies:     Routine Micro:  20-Aug-15 16:18   Micro Text Report BLOOD CULTURE   COMMENT                   NO GROWTH IN 48 HOURS   ANTIBIOTIC                       Micro Text Report BLOOD CULTURE   COMMENT                   NO GROWTH IN 48 HOURS   ANTIBIOTIC                       Culture Comment NO GROWTH IN 48 HOURS  Result(s) reported on 17 Jan 2014 at 04:00PM.  Culture Comment NO GROWTH IN 48 HOURS  Result(s) reported on 17 Jan 2014 at 04:00PM.  Routine Chem:  20-Aug-15 15:23   Creatinine (comp)  2.11  21-Aug-15 04:03   Creatinine (comp)  1.81  23-Aug-15 03:51   Creatinine (comp) 1.29  24-Aug-15 06:05   Creatinine (comp)  1.37  Cardiac:  20-Aug-15 15:23   Troponin I < 0.02 (0.00-0.05 0.05 ng/mL or less: NEGATIVE  Repeat testing in 3-6 hrs  if clinically indicated. >0.05 ng/mL: POTENTIAL  MYOCARDIAL INJURY. Repeat  testing in 3-6 hrs if  clinically indicated. NOTE: An increase or decrease  of 30% or more on serial  testing suggests a  clinically important change)  Routine Coag:  20-Aug-15 15:23   INR 1.2 (INR reference interval applies to patients on anticoagulant therapy. A single INR therapeutic range for coumarins is not optimal for all indications; however, the  suggested range for most indications is 2.0 - 3.0. Exceptions to the INR Reference Range may include: Prosthetic heart valves, acute myocardial infarction, prevention of myocardial infarction, and combinations of aspirin and anticoagulant. The need for a higher or lower target INR must be assessed individually. Reference: The Pharmacology and Management of the Vitamin K  antagonists: the seventh ACCP Conference on Antithrombotic and Thrombolytic Therapy. Chest.2004 Sept:126 (3suppl): L7870634. A HCT value >55% may artifactually increase the PT.  In one study,  the increase was an average of 25%. Reference:  "Effect on Routine and Special Coagulation Testing Values of Citrate Anticoagulant Adjustment in Patients with High HCT Values." American Journal of Clinical Pathology 2006;126:400-405.)  Routine Hem:  20-Aug-15 15:23   WBC (CBC)  14.9  Hemoglobin (CBC)  8.0  Hematocrit (CBC)  25.5  21-Aug-15 04:03   WBC (CBC) 9.2  Hemoglobin (CBC)  7.3  Hematocrit (CBC)  22.3  22-Aug-15 10:40   WBC (CBC) 6.8  Hemoglobin (CBC)  8.2  Hematocrit (CBC)  24.7  23-Aug-15 03:51   WBC (CBC) 9.1  Hemoglobin (CBC)  8.2  Hematocrit (CBC)  24.8  24-Aug-15 06:05   WBC (CBC) 9.7  Hemoglobin (CBC)  8.3  Hematocrit (CBC)  25.5   PERTINENT RADIOLOGY STUDIES: XRay:    20-Aug-15 17:08, Chest PA and Lateral  Chest PA and Lateral   REASON FOR EXAM:    weakness, confusion  COMMENTS:   May transport without cardiac monitor    PROCEDURE: DXR - DXR CHEST PA (OR AP) AND LATERAL  - Jan 15 2014  5:08PM     CLINICAL DATA:  Weakness.    EXAM:  CHEST  2 VIEW    COMPARISON:  08/30/2013.    FINDINGS:  Mediastinum and hilar structures normal. Stable cardiomegaly. Stable  cardiac pacer. No pleural effusion or pneumothorax. No focal  pulmonary infiltrate. No acute bony abnormality . Stable deformity  right humerus consistent with old trauma.     IMPRESSION:  1. Stable cardiomegaly, no CHF.  Cardiac pacer  in stable position.  2. No acute pulmonary disease.      Electronically Signed    By: Maisie Fus  Register    On: 01/15/2014 17:11         Verified By: Gwynn Burly, M.D., MD  CT:    20-Aug-15 17:13, CT Head Without Contrast  CT Head Without Contrast   REASON FOR EXAM:    confusion, plavix  COMMENTS:       PROCEDURE: CT  - CT HEAD WITHOUT CONTRAST  - Jan 15 2014  5:13PM     CLINICAL DATA:  Weakness and confusion. On Plavix. Possible stroke.    EXAM:  CT HEAD WITHOUT CONTRAST    TECHNIQUE:  Contiguous axial images were obtained from the base of the skull  through the vertex without intravenous contrast.    COMPARISON:  None.  FINDINGS:  There is confluent hypoattenuation in the left frontal lobe  involving cortex and white matter, consistent with an infarct.  Volume loss in this region, indicated by ex vacuo dilatation of the  left lateral ventricle and enlargement of the left sylvian fissure,  is indicative of a chronic infarct. There is also a small, remote  infarct in the anterior limb ofthe left internal capsule.  Hypodensities elsewhere in the periventricular white matter are  nonspecific but compatible with mild chronic small vessel ischemic  disease, with a remote lacunar infarct noted in the right frontal  lobe white matter. There is no evidence of acute intracranial  hemorrhage, mass, midline shift, or extra-axial fluid collection.    Prior bilateral cataract extraction is noted. Visualized mastoid air  cells and paranasal sinuses are clear.   IMPRESSION:  1. No intracranial hemorrhage or definite acute infarct identified.  2. Chronic left frontal lobe infarct.      Electronically Signed    By: Sebastian Ache    On: 01/15/2014 17:21         Verified By: Prudy Feeler, M.D.,   Pertinent Past History:  Pertinent Past History CAD, s/p stent, s/p PPM, HTN h/o CVA PVD CRI Gout   Hospital Course:  Hospital Course Elderly lady with h/o CAD, s/p  stent, s/p PPM, h/o CVA, PVD, admitted with GI bleed, anemia and metabolic encephalopathy. She received i.v protonix, i.v fluids and one unit of PRCs for low Hb of 7.3 on day 2 of her hospital stay. Patient's mental status improved. No further episodes of bleeding. H/H remained stable. EGD revealed two ulcers in duodenal bulb with clean base. H pylori antibodies sent, results awaited. Diet was  advanced. Patient tolerated diet well. Creatinine returned to baseline. She c/o left knee pain and swwelling, felt to be gouty flare, received by solumedrol. Hypertension was controlled. Her usual cardiac medications were continued, including Hydralazine, Toprol, Norvasc and Imdur. Exam: Alert, oriented, NAD. HENT: No JVD, Chest: clear, CVS: RRR, systolic murmur, Abdomen: soft, nontender, +BS, Joints: left knee- no erythema or increased warmth. Ext: no ankle edema. Plan is to discharge to rehab facility today.   Condition on Discharge Stable   DISCHARGE INSTRUCTIONS HOME MEDS:  Medication Reconciliation: Patient's Home Medications at Discharge:     Medication Instructions  hydralazine 100 mg oral tablet  1 tab(s) orally 3 times a day   multivitamin  1 tab(s) orally once a day   amlodipine 10 mg oral tablet  1 tab(s) orally once a day   gemfibrozil 600 mg oral tablet  1 tab(s) orally 2 times a day   vitamin d3 400 intl units oral tablet  1 tab(s) orally 2 times a day   folic acid 1 mg oral tablet  1 tab(s) orally once a day   isosorbide mononitrate 60 mg oral tablet, extended release  1 tab(s) orally once a day   metoprolol succinate 50 mg oral tablet, extended release  1 tab(s) orally once a day   furosemide 20 mg oral tablet  1 tab(s) orally once a day   lovastatin 40 mg oral tablet  1 tab(s) orally once a day (in the morning)   potassium chloride 20 meq oral tablet, extended release  1 tab(s) orally once a day (at bedtime)   paroxetine 30 mg oral tablet  1 tab(s) orally once a day (at bedtime)   nystatin  topical 100000 units/g topical cream  Apply topically to affected area once a day and after bathing as needed for irritation   pilocarpine ophthalmic  3 drop(s) to each affected eye every 24 hours   prednisone 20 mg oral tablet  3 tab(s) orally once a day   acetaminophen 325 mg oral tablet  2 tab(s) orally every 4 hours, As needed, mild pain (1-3/10) or temp. greater than 100.4   pantoprazole  40 milligram(s) orally every 12 hours    STOP TAKING THE FOLLOWING MEDICATION(S):    allopurinol 100 mg oral tablet: 1 tab(s) orally once a day aspirin 81 mg oral tablet: 1 tab(s) orally once a day clopidogrel 75 mg oral tablet: 1 tab(s) orally once a day calcium carbonate 500 mg oral tablet, chewable: 1 tab(s) orally once a day  Physician's Instructions:  Diet Low Sodium  Low Fat, Low Cholesterol  Renal Diet   Activity Limitations As tolerated    Return to Work Not Applicable   Time frame for Follow Up Appointment 1-2 weeks  Dr. Shelle Ironein   Time frame for Follow Up Appointment Dr. Leotis ShamesJasmine Tahliyah Anagnos   Other Comments Aspirin and Plavix held for GI bleed. Resume after GI f/u in 1-2 weeks.     Thedore MinsSingh, Pocahontas Cohenour(Attending Physician): [facility], [address], [place], [place] [address], [place] [phone]   Shelle Ironein, Matthew(Consultant): [facility], [address], [place], [place] [address], [place] [phone]  Electronic Signatures: Leotis ShamesSingh, Jennamarie Goings (MD)  (Signed 24-Aug-15 13:44)  Authored: ADMISSION DATE AND DIAGNOSIS, CHIEF COMPLAINT/HPI, Allergies, PERTINENT LABS, PERTINENT RADIOLOGY STUDIES, PERTINENT PAST HISTORY, HOSPITAL COURSE, DISCHARGE INSTRUCTIONS HOME MEDS, PATIENT INSTRUCTIONS, Follow Up Physician   Last Updated: 24-Aug-15 13:44 by Leotis ShamesSingh, Quince Santana (MD)

## 2014-09-19 NOTE — H&P (Signed)
PATIENT NAME:  Sally Curry, Sally Curry MR#:  [id number] DATE OF BIRTH:  1940-09-08  DATE OF ADMISSION:  08/31/2013  PRIMARY CARE PHYSICIAN: [facility].   CHIEF COMPLAINT: Abdominal pain.   HISTORY OF PRESENT ILLNESS: Sally Curry is a 74 year old morbidly obese female with a past medical history of multiple medical problems, coronary artery disease, hypertension, diabetes mellitus, morbid obesity, who presented to the emergency department with complaints of suprapubic pain and shortness of breath. The patient's initial blood pressure was 213/66 and oxygen saturations were 100% on room air. Denied having any cough, productive sputum. The patient walks with the help of a walker and a wheelchair. Further workup also revealed the patient has urinary tract infection, which would explain patient's suprapubic pain. The patient does not have any fever, no elevated white blood cell count. However, the patient has urine WBC of 439 with 2+ bacteria and 3+ leukocyte esterase. This patient received 1 dose of Rocephin in the emergency department. Concerning about the patient's lower extremity swelling, obtained lower extremity Dopplers which came back to be negative for any DVT. Chest x-ray did not reveal any congestive heart failure.   PAST MEDICAL HISTORY:  1. Congestive heart failure.  2. Diabetes mellitus. 3. Coronary artery disease status post myocardial infarction and the 2 stents placement.  4. History of cerebrovascular accident. 5. Chronic renal insufficiency.  6. Glaucoma  7. Morbid obesity.   PAST SURGICAL HISTORY:  1. Appendectomy.  2. Tonsillectomy.  3. Right carotid endarterectomy.  4. Appendectomy.  5. Hysterectomy.  6. Coronary artery disease status post stent placement.   ALLERGIES: No known drug allergies.   HOME MEDICATIONS:  1. Metoprolol XL 50 mg 1 tablet once a day.  2. Senna 8.6 mg 1 tablet once a day.  3. Protonix 40 mg 2 tablets once a day.  4.  Pilocarpine ophthalmic drops.  5. Paxil 30 mg once a day.  6. Nystatin to the groin area.  7. Norvasc 5 mg once a day.  8. Norco 5/325 mg 1 to 2 tablets every 4 hours as needed.  9. Multivitamin 1 tablet once a day.  10. Lovastatin 40 mg once a day.  11. Lotrisone, apply topically to the affected area.  12. Lopid 600 mg 2 times a day. 13. K-Dur 20 mg 2 times a day.  14. Imdur 60 mg once a day.  15. Hydralazine 100 mg 3 times a day.  16. Lasix 30 mg once a day.  17. Folic acid 1 mg once a day.  18. Plavix 75 mg a day.  19. Calcium carbonate 500 mg a day.  20. Aspirin 81 mg once a day. 21. Allopurinol 100 mg once a day.   SOCIAL HISTORY: No history of smoking, drinking alcohol, or using illicit drugs. [facility].   FAMILY HISTORY: Mother with hypertension, father with diabetes mellitus.   REVIEW OF SYSTEMS:  CONSTITUTIONAL: Denies any generalized fever. Has chronic generalized weakness.  EYES: No change in vision.  ENT: No change in hearing.  RESPIRATORY: Has mild shortness of breath with exertion.  CARDIOVASCULAR: No chest pain, palpations.  GASTROINTESTINAL: No nausea, vomiting, abdominal pain.  GENITOURINARY: Has frequent urination and dysuria.  ENDOCRINE: Has diagnosis of diabetes mellitus.  HEMATOLOGY: No easy bruising or bleeding.  MUSCULOSKELETAL: Chronic pain. NEUROLOGIC: No numbness in any part of the body.   PHYSICAL EXAMINATION:  GENERAL: The patient is a well-built, well-nourished, morbidly obese female lying  down in the bed, not in distress.  VITAL SIGNS: Temperature 97.8, pulse 70, blood pressure 155/65, respiratory rate of 16, oxygen saturation 94% on room air.  HEENT: Head normocephalic, atraumatic. There is no scleral icterus. Conjunctivae normal. Pupils equal and react to light. Extraocular movements are intact. Mucous membranes dry. No pharyngeal erythema.  NECK: Supple. No lymphadenopathy. No JVD. No carotid  bruit.  CHEST: There is no focal tenderness. Somewhat decreased breath sounds in the lower lobes.  HEART: S1, S2, regular. Mildly diastolic murmur is heard.  ABDOMEN: Obese, has large pannus with mild tenderness in the suprapubic area. No rebound or guarding. Could not appreciate any hepatosplenomegaly.  EXTREMITIES: No pedal edema. Pulses 2+. SKIN: Has redness under the pannus consistent with yeast infection.  MUSCULOSKELETAL: Has good range of motion in all extremities.  NEUROLOGIC: The patient is alert, oriented to place, person, and time. Cranial nerves II through XII intact. Motor 5/5 in upper and lower extremities.   LABORATORY DATA: Urinalysis had 3+ leukocytosis, 419 WBC, and 2+ bacteria. BMP: BUN 37, creatinine of 1.43, values are within normal limits. CBC is completely within normal limits.   ASSESSMENT AND PLAN: Sally Curry is a 74 year old female who comes to the emergency department with shortness of breath and suprapubic pain.  1. Urinary tract infection: Concerning about the patient's multiple medical problems and diabetes mellitus. The patient's poor mobility with a strong urinary tract infection. Will admit the patient to the medical bed. Obtain urine cultures. Keep the patient on Rocephin. De-escalate the antibiotics once we have the culture data available.  2. Diabetes mellitus. Continue the home medications also keep the patient on sliding scale insulin.  3. Shortness of breath. This could be secondary to obesity, hypoventilation syndrome, and deconditioning. The patient seems to be at the euvolemic state. The patient has dry mucous membranes and good oxygen saturations. Chest x-ray does not show any pulmonary edema. We will continue with home dose of the Lasix. Do not think patient is in congestive heart failure at this time.  4. Morbid obesity. Counseled with the patient regarding diet and exercise.  5. Chronic renal insufficiency, mild worsening from the baseline. Will  continue to follow up. Seems to be more from the over diuresis; however, will continue to follow up. Concerning about patient's shortness of breath, will not make any changes to the Lasix for today. Keep the patient on deep vein thrombosis prophylaxis with Lovenox.   TIME SPENT: 45 minutes.     ____________________________ Susa Griffins, MD pv:lt D: 08/31/2013 04:13:30 ET T: 08/31/2013 05:59:21 ET JOB#: [id number]  cc: Susa Griffins, MD, <Dictator> Clerance Lav Albaraa Swingle MD ELECTRONICALLY SIGNED 09/11/2013 0:30

## 2014-09-19 NOTE — Consult Note (Signed)
Details:   - GI Note:  EGD: two clean based duod bulb ulcers.  Recs:  BID PPI for 8 weeks, then daily carafate for 1 week h pylori serogy avoid nsaids safe for dc once hgb stable.   Electronic Signatures: Dow Adolphein, Matthew (MD)  (Signed 22-Aug-15 14:50)  Authored: Details   Last Updated: 22-Aug-15 14:50 by Dow Adolphein, Matthew (MD)

## 2014-09-19 NOTE — Discharge Summary (Signed)
PATIENT NAME:  Sally Curry, Sally Curry MR#:  [id number] DATE OF BIRTH:  12-17-1940  PRIMARY CARE PHYSICIAN: Nonlocal  DATE OF ADMISSION:  08/31/2013 DATE OF DISCHARGE:  09/01/2013  DISCHARGE DIAGNOSES: Urinary tract infection, chronic kidney disease, diabetes, hypertension, congestive heart failure.   CONDITION: Stable.   CODE STATUS: Full code.  MEDICATIONS: Please refer to the medications reconciliation list.    DIET: Low-sodium, low-fat, low-cholesterol diet.   ACTIVITY: As tolerated.   FOLLOW-UP CARE: Follow with PCP within 1 to 2 weeks.   REASON FOR ADMISSION: Abdominal pain.   HOSPITAL COURSE: The patient is a 74 year old Caucasian female with a history of congestive heart failure, hypertension, diabetes, was sent from nursing home to ED due to suprapubic  abdominal pain. The patient's urinalysis showed WBC 439, with 2+ bacteria. She received one dose of Rocephin in the ED and admitted for urinary tract infection. Since the patient has some lower extremity swelling, the patient got venous duplex, which did not show any deep vein thrombosis. For detailed history and physical examination, please refer to the admission note dictated by Dr. Heron NayVasireddy. On admission date, the patient's BUN 37, creatinine 1.43. CBC in normal range.  1. First the UTI. After admission, the patient has been treated with Rocephin IV. The patient's dysuria has much improved. The patient has no complaints of abdominal pain, dysuria or hematuria. No fever.  2. Diabetes, has been treated with sliding scale.  3. CKD has been stable. Dehydration has much improved. BUN decreased from 37 to 30, creatinine decreased to 1.21. The patient's vital signs stable. She is clinically stable and will be discharged back to skilled nursing facility today. I discussed the patient's discharge plan with the patient, the patient's daughter, nurse and social worker.   TIME SPENT: About 37 minutes.   ____________________________ Shaune PollackQing Macarthur Lorusso,  MD qc:sg D: 09/01/2013 12:27:50 ET T: 09/01/2013 13:01:00 ET JOB#: [id number]  cc: Shaune PollackQing Ahana Najera, MD, <Dictator> Shaune PollackQING Amen Staszak MD ELECTRONICALLY SIGNED 09/01/2013 14:39

## 2015-03-16 ENCOUNTER — Emergency Department: Payer: Medicare Other

## 2015-03-16 ENCOUNTER — Observation Stay
Admission: EM | Admit: 2015-03-16 | Discharge: 2015-03-19 | Disposition: A | Discharge status: Home / Self Care | Payer: Medicare Other | Attending: Internal Medicine | Admitting: Internal Medicine

## 2015-03-16 ENCOUNTER — Encounter: Payer: Self-pay | Admitting: Emergency Medicine

## 2015-03-16 ENCOUNTER — Observation Stay
Admit: 2015-03-16 | Discharge: 2015-03-16 | Disposition: A | Discharge status: Home / Self Care | Payer: Medicare Other | Attending: Internal Medicine | Admitting: Internal Medicine

## 2015-03-16 DIAGNOSIS — I509 Heart failure, unspecified: Secondary | ICD-10-CM | POA: Diagnosis not present

## 2015-03-16 DIAGNOSIS — I739 Peripheral vascular disease, unspecified: Secondary | ICD-10-CM | POA: Diagnosis not present

## 2015-03-16 DIAGNOSIS — Z833 Family history of diabetes mellitus: Secondary | ICD-10-CM | POA: Insufficient documentation

## 2015-03-16 DIAGNOSIS — Z8673 Personal history of transient ischemic attack (TIA), and cerebral infarction without residual deficits: Secondary | ICD-10-CM | POA: Insufficient documentation

## 2015-03-16 DIAGNOSIS — Z950 Presence of cardiac pacemaker: Secondary | ICD-10-CM | POA: Insufficient documentation

## 2015-03-16 DIAGNOSIS — R42 Dizziness and giddiness: Secondary | ICD-10-CM | POA: Insufficient documentation

## 2015-03-16 DIAGNOSIS — M109 Gout, unspecified: Secondary | ICD-10-CM | POA: Insufficient documentation

## 2015-03-16 DIAGNOSIS — H409 Unspecified glaucoma: Secondary | ICD-10-CM | POA: Insufficient documentation

## 2015-03-16 DIAGNOSIS — R109 Unspecified abdominal pain: Secondary | ICD-10-CM | POA: Diagnosis not present

## 2015-03-16 DIAGNOSIS — Z79899 Other long term (current) drug therapy: Secondary | ICD-10-CM | POA: Diagnosis not present

## 2015-03-16 DIAGNOSIS — Z7901 Long term (current) use of anticoagulants: Secondary | ICD-10-CM | POA: Insufficient documentation

## 2015-03-16 DIAGNOSIS — Z955 Presence of coronary angioplasty implant and graft: Secondary | ICD-10-CM | POA: Insufficient documentation

## 2015-03-16 DIAGNOSIS — R51 Headache: Secondary | ICD-10-CM | POA: Insufficient documentation

## 2015-03-16 DIAGNOSIS — Z6836 Body mass index (BMI) 36.0-36.9, adult: Secondary | ICD-10-CM | POA: Insufficient documentation

## 2015-03-16 DIAGNOSIS — K5900 Constipation, unspecified: Secondary | ICD-10-CM | POA: Insufficient documentation

## 2015-03-16 DIAGNOSIS — K439 Ventral hernia without obstruction or gangrene: Secondary | ICD-10-CM | POA: Diagnosis not present

## 2015-03-16 DIAGNOSIS — F419 Anxiety disorder, unspecified: Secondary | ICD-10-CM | POA: Insufficient documentation

## 2015-03-16 DIAGNOSIS — R531 Weakness: Secondary | ICD-10-CM

## 2015-03-16 DIAGNOSIS — I700 Atherosclerosis of aorta: Secondary | ICD-10-CM | POA: Diagnosis not present

## 2015-03-16 DIAGNOSIS — R0989 Other specified symptoms and signs involving the circulatory and respiratory systems: Secondary | ICD-10-CM | POA: Diagnosis not present

## 2015-03-16 DIAGNOSIS — I2510 Atherosclerotic heart disease of native coronary artery without angina pectoris: Secondary | ICD-10-CM | POA: Diagnosis not present

## 2015-03-16 DIAGNOSIS — R079 Chest pain, unspecified: Principal | ICD-10-CM | POA: Diagnosis present

## 2015-03-16 DIAGNOSIS — Z820 Family history of epilepsy and other diseases of the nervous system: Secondary | ICD-10-CM | POA: Diagnosis not present

## 2015-03-16 DIAGNOSIS — I495 Sick sinus syndrome: Secondary | ICD-10-CM | POA: Diagnosis not present

## 2015-03-16 DIAGNOSIS — R600 Localized edema: Secondary | ICD-10-CM | POA: Insufficient documentation

## 2015-03-16 DIAGNOSIS — I252 Old myocardial infarction: Secondary | ICD-10-CM | POA: Insufficient documentation

## 2015-03-16 DIAGNOSIS — M79602 Pain in left arm: Secondary | ICD-10-CM | POA: Diagnosis not present

## 2015-03-16 DIAGNOSIS — N186 End stage renal disease: Secondary | ICD-10-CM | POA: Diagnosis not present

## 2015-03-16 DIAGNOSIS — R0602 Shortness of breath: Secondary | ICD-10-CM | POA: Diagnosis not present

## 2015-03-16 DIAGNOSIS — K5730 Diverticulosis of large intestine without perforation or abscess without bleeding: Secondary | ICD-10-CM | POA: Insufficient documentation

## 2015-03-16 DIAGNOSIS — K279 Peptic ulcer, site unspecified, unspecified as acute or chronic, without hemorrhage or perforation: Secondary | ICD-10-CM | POA: Diagnosis not present

## 2015-03-16 DIAGNOSIS — Z9049 Acquired absence of other specified parts of digestive tract: Secondary | ICD-10-CM | POA: Insufficient documentation

## 2015-03-16 DIAGNOSIS — I272 Other secondary pulmonary hypertension: Secondary | ICD-10-CM | POA: Insufficient documentation

## 2015-03-16 DIAGNOSIS — F329 Major depressive disorder, single episode, unspecified: Secondary | ICD-10-CM | POA: Insufficient documentation

## 2015-03-16 DIAGNOSIS — Z825 Family history of asthma and other chronic lower respiratory diseases: Secondary | ICD-10-CM | POA: Insufficient documentation

## 2015-03-16 DIAGNOSIS — E669 Obesity, unspecified: Secondary | ICD-10-CM | POA: Diagnosis not present

## 2015-03-16 DIAGNOSIS — I6523 Occlusion and stenosis of bilateral carotid arteries: Secondary | ICD-10-CM | POA: Diagnosis not present

## 2015-03-16 DIAGNOSIS — N281 Cyst of kidney, acquired: Secondary | ICD-10-CM | POA: Insufficient documentation

## 2015-03-16 DIAGNOSIS — I1311 Hypertensive heart and chronic kidney disease without heart failure, with stage 5 chronic kidney disease, or end stage renal disease: Secondary | ICD-10-CM | POA: Insufficient documentation

## 2015-03-16 DIAGNOSIS — I69351 Hemiplegia and hemiparesis following cerebral infarction affecting right dominant side: Secondary | ICD-10-CM | POA: Diagnosis not present

## 2015-03-16 DIAGNOSIS — Z992 Dependence on renal dialysis: Secondary | ICD-10-CM | POA: Diagnosis not present

## 2015-03-16 DIAGNOSIS — Z8249 Family history of ischemic heart disease and other diseases of the circulatory system: Secondary | ICD-10-CM | POA: Diagnosis not present

## 2015-03-16 LAB — CBC WITH DIFFERENTIAL/PLATELET
Basophils Absolute: 0.1 10*3/uL (ref 0–0.1)
Basophils Relative: 1 %
Eosinophils Absolute: 0.3 10*3/uL (ref 0–0.7)
Eosinophils Relative: 3 %
HEMATOCRIT: 40.8 % (ref 35.0–47.0)
Hemoglobin: 13.4 g/dL (ref 12.0–16.0)
LYMPHS ABS: 0.9 10*3/uL — AB (ref 1.0–3.6)
LYMPHS PCT: 11 %
MCH: 31.6 pg (ref 26.0–34.0)
MCHC: 32.9 g/dL (ref 32.0–36.0)
MCV: 96.1 fL (ref 80.0–100.0)
MONO ABS: 0.7 10*3/uL (ref 0.2–0.9)
MONOS PCT: 8 %
NEUTROS ABS: 6.5 10*3/uL (ref 1.4–6.5)
Neutrophils Relative %: 77 %
Platelets: 254 10*3/uL (ref 150–440)
RBC: 4.25 MIL/uL (ref 3.80–5.20)
RDW: 13.5 % (ref 11.5–14.5)
WBC: 8.5 10*3/uL (ref 3.6–11.0)

## 2015-03-16 LAB — COMPREHENSIVE METABOLIC PANEL
ALK PHOS: 105 U/L (ref 38–126)
ALT: 15 U/L (ref 14–54)
ANION GAP: 10 (ref 5–15)
AST: 27 U/L (ref 15–41)
Albumin: 4.4 g/dL (ref 3.5–5.0)
BILIRUBIN TOTAL: 0.7 mg/dL (ref 0.3–1.2)
BUN: 47 mg/dL — AB (ref 6–20)
CALCIUM: 10.5 mg/dL — AB (ref 8.9–10.3)
CO2: 24 mmol/L (ref 22–32)
Chloride: 110 mmol/L (ref 101–111)
Creatinine, Ser: 1.37 mg/dL — ABNORMAL HIGH (ref 0.44–1.00)
GFR calc Af Amer: 43 mL/min — ABNORMAL LOW (ref >60)
GFR, EST NON AFRICAN AMERICAN: 38 mL/min — AB (ref >60)
Glucose, Bld: 138 mg/dL — ABNORMAL HIGH (ref 65–99)
POTASSIUM: 3.7 mmol/L (ref 3.5–5.1)
Sodium: 144 mmol/L (ref 135–145)
TOTAL PROTEIN: 7.9 g/dL (ref 6.5–8.1)

## 2015-03-16 LAB — BLOOD GAS, ARTERIAL
ACID-BASE DEFICIT: 4.4 mmol/L — AB (ref 0.0–2.0)
Allens test (pass/fail): POSITIVE — AB
BICARBONATE: 20.2 meq/L — AB (ref 21.0–28.0)
FIO2: 21
O2 Saturation: 92.1 %
PH ART: 7.37 (ref 7.350–7.450)
PO2 ART: 66 mmHg — AB (ref 83.0–108.0)
Patient temperature: 37
pCO2 arterial: 35 mmHg (ref 32.0–48.0)

## 2015-03-16 LAB — URINALYSIS COMPLETE WITH MICROSCOPIC (ARMC ONLY)
BILIRUBIN URINE: NEGATIVE
GLUCOSE, UA: NEGATIVE mg/dL
Hgb urine dipstick: NEGATIVE
KETONES UR: NEGATIVE mg/dL
LEUKOCYTES UA: NEGATIVE
Nitrite: NEGATIVE
PH: 5 (ref 5.0–8.0)
Protein, ur: 100 mg/dL — AB
Specific Gravity, Urine: 1.01 (ref 1.005–1.030)
Squamous Epithelial / LPF: NONE SEEN

## 2015-03-16 LAB — TSH: TSH: 4.351 u[IU]/mL (ref 0.350–4.500)

## 2015-03-16 LAB — BRAIN NATRIURETIC PEPTIDE: B NATRIURETIC PEPTIDE 5: 278 pg/mL — AB (ref 0.0–100.0)

## 2015-03-16 LAB — TROPONIN I

## 2015-03-16 MED ORDER — PANTOPRAZOLE SODIUM 40 MG PO TBEC
40.0000 mg | DELAYED_RELEASE_TABLET | Freq: Two times a day (BID) | ORAL | Status: DC
  Administered 2015-03-16 – 2015-03-19 (×6): 40 mg via ORAL
  Filled 2015-03-16 (×6): qty 1

## 2015-03-16 MED ORDER — ISOSORBIDE MONONITRATE ER 60 MG PO TB24
60.0000 mg | ORAL_TABLET | Freq: Every day | ORAL | Status: DC
  Administered 2015-03-17 – 2015-03-19 (×3): 60 mg via ORAL
  Filled 2015-03-16 (×3): qty 1

## 2015-03-16 MED ORDER — PRAVASTATIN SODIUM 40 MG PO TABS
40.0000 mg | ORAL_TABLET | Freq: Every day | ORAL | Status: DC
  Administered 2015-03-17 – 2015-03-18 (×2): 40 mg via ORAL
  Filled 2015-03-16 (×3): qty 1

## 2015-03-16 MED ORDER — ALLOPURINOL 100 MG PO TABS
100.0000 mg | ORAL_TABLET | Freq: Every day | ORAL | Status: DC
  Administered 2015-03-17 – 2015-03-19 (×3): 100 mg via ORAL
  Filled 2015-03-16 (×3): qty 1

## 2015-03-16 MED ORDER — CLOPIDOGREL BISULFATE 75 MG PO TABS
75.0000 mg | ORAL_TABLET | Freq: Every day | ORAL | Status: DC
  Administered 2015-03-17 – 2015-03-19 (×3): 75 mg via ORAL
  Filled 2015-03-16 (×3): qty 1

## 2015-03-16 MED ORDER — IOHEXOL 240 MG/ML SOLN
25.0000 mL | INTRAMUSCULAR | Status: AC
  Administered 2015-03-16: 25 mL via ORAL

## 2015-03-16 MED ORDER — PAROXETINE HCL 30 MG PO TABS
30.0000 mg | ORAL_TABLET | Freq: Every day | ORAL | Status: DC
  Administered 2015-03-17 – 2015-03-19 (×3): 30 mg via ORAL
  Filled 2015-03-16 (×4): qty 1

## 2015-03-16 MED ORDER — CLONIDINE HCL 0.1 MG PO TABS
0.2000 mg | ORAL_TABLET | Freq: Once | ORAL | Status: AC
  Administered 2015-03-16: 0.2 mg via ORAL
  Filled 2015-03-16: qty 2

## 2015-03-16 MED ORDER — SODIUM CHLORIDE 0.9 % IV BOLUS (SEPSIS)
500.0000 mL | Freq: Once | INTRAVENOUS | Status: AC
  Administered 2015-03-16: 500 mL via INTRAVENOUS

## 2015-03-16 MED ORDER — LORAZEPAM 2 MG/ML IJ SOLN
0.5000 mg | Freq: Once | INTRAMUSCULAR | Status: AC
  Administered 2015-03-16: 0.5 mg via INTRAVENOUS
  Filled 2015-03-16: qty 1

## 2015-03-16 MED ORDER — ONDANSETRON HCL 4 MG/2ML IJ SOLN: 4.0000 mg | Freq: Four times a day (QID) | INTRAMUSCULAR | Status: DC | PRN

## 2015-03-16 MED ORDER — POTASSIUM CHLORIDE CRYS ER 20 MEQ PO TBCR
20.0000 meq | EXTENDED_RELEASE_TABLET | Freq: Every day | ORAL | Status: DC
  Administered 2015-03-17 – 2015-03-19 (×3): 20 meq via ORAL
  Filled 2015-03-16 (×3): qty 1

## 2015-03-16 MED ORDER — KETOROLAC TROMETHAMINE 30 MG/ML IJ SOLN
15.0000 mg | Freq: Once | INTRAMUSCULAR | Status: AC
  Administered 2015-03-16: 15 mg via INTRAVENOUS
  Filled 2015-03-16: qty 1

## 2015-03-16 MED ORDER — HEPARIN SODIUM (PORCINE) 5000 UNIT/ML IJ SOLN
5000.0000 [IU] | Freq: Three times a day (TID) | INTRAMUSCULAR | Status: DC
  Administered 2015-03-16 – 2015-03-19 (×9): 5000 [IU] via SUBCUTANEOUS
  Filled 2015-03-16 (×8): qty 1

## 2015-03-16 MED ORDER — ACETAMINOPHEN 325 MG PO TABS
650.0000 mg | ORAL_TABLET | ORAL | Status: DC | PRN
  Administered 2015-03-18 – 2015-03-19 (×2): 650 mg via ORAL
  Filled 2015-03-16 (×2): qty 2

## 2015-03-16 MED ORDER — METOPROLOL SUCCINATE ER 50 MG PO TB24
50.0000 mg | ORAL_TABLET | Freq: Every day | ORAL | Status: DC
Start: 2015-03-16 — End: 2015-03-19
  Administered 2015-03-18 – 2015-03-19 (×2): 50 mg via ORAL
  Filled 2015-03-16 (×3): qty 1

## 2015-03-16 NOTE — ED Provider Notes (Signed)
Time Seen: Approximately 10:15  I have reviewed the triage notes  Chief Complaint: Chest Pain   History of Present Illness: Sally Curry is a 74 y.o. female who presents with left-sided chest discomfort and generalized weakness and lethargy. Patient's chest pain started last night she points mainly to the left side of her chest and with occasional pain in the left arm. Patient's chest discomfort comes and goesstill minimally present. No obvious exacerbating or relieving factors. The daughter who is the primary historian for the majority of the history and review of systems states patient's had general decrease in her energy over the last week. They've had difficulty taking care of her at home due to his generalized weakness. Patient describes a mild headache and points mainly to the frontal region. There's been no recent trauma. Patient denies any current back or flank discomfort. She denies any difficulty with urination or bowel movements. She goes on to state that she's had some abdominal pain and points to the right lower quadrant patient denies any loose stool or diarrhea.   Past Medical History  Diagnosis Date  . CAD (coronary artery disease)   . Hypertension   . Stroke (HCC)   . Gout   . PVD (peripheral vascular disease) (HCC)   . Anemia   . Chronic kidney disease   . Anxiety   . CHF (congestive heart failure) (HCC)   . Depression   . Glaucoma     Patient Active Problem List   Diagnosis Date Noted  . Chest pain 03/16/2015  . Duodenal ulcer with hemorrhage 01/20/2014  . Acute lower GI bleeding 01/20/2014  . CAD (coronary artery disease), native coronary artery 01/20/2014  . Essential hypertension, benign 01/20/2014  . CHF with unknown LVEF (HCC) 01/20/2014  . Dyslipidemia 01/20/2014  . Gout of right knee 01/20/2014  . Hypokalemia 01/20/2014  . Glaucoma 01/20/2014  . Depression with anxiety 01/20/2014    Past Surgical History  Procedure Laterality Date  . Coronary  angioplasty with stent placement    . Pacemaker insertion    . Cholecystectomy    . Carotid endarterectomy    . Percutaneous placement intravascular stent cervical carotid artery    . Appendectomy      Past Surgical History  Procedure Laterality Date  . Coronary angioplasty with stent placement    . Pacemaker insertion    . Cholecystectomy    . Carotid endarterectomy    . Percutaneous placement intravascular stent cervical carotid artery    . Appendectomy      Current Outpatient Rx  Name  Route  Sig  Dispense  Refill  . allopurinol (ZYLOPRIM) 100 MG tablet   Oral   Take 100 mg by mouth daily.         Marland Kitchen. amLODipine (NORVASC) 10 MG tablet   Oral   Take 1 tablet by mouth daily.         . clopidogrel (PLAVIX) 75 MG tablet   Oral   Take 75 mg by mouth daily.         Marland Kitchen. gemfibrozil (LOPID) 600 MG tablet   Oral   Take 600 mg by mouth 2 (two) times daily before a meal.         . hydrALAZINE (APRESOLINE) 100 MG tablet   Oral   Take 100 mg by mouth 3 (three) times daily.         . isosorbide mononitrate (IMDUR) 60 MG 24 hr tablet   Oral   Take 60 mg  by mouth daily.         Marland Kitchen lovastatin (MEVACOR) 40 MG tablet   Oral   Take 40 mg by mouth at bedtime.         . metoprolol succinate (TOPROL-XL) 50 MG 24 hr tablet   Oral   Take 50 mg by mouth daily. Take with or immediately following a meal.         . Multiple Vitamin tablet   Oral   Take 1 tablet by mouth daily.         Marland Kitchen omeprazole (PRILOSEC) 40 MG capsule   Oral   Take 40 mg by mouth daily.         . pantoprazole (PROTONIX) 40 MG tablet   Oral   Take 40 mg by mouth 2 (two) times daily.          Marland Kitchen PARoxetine (PAXIL) 30 MG tablet   Oral   Take 30 mg by mouth daily.         . potassium chloride SA (K-DUR,KLOR-CON) 20 MEQ tablet   Oral   Take 20 mEq by mouth daily.           Allergies:  Review of patient's allergies indicates no known allergies.  Family History: Family History   Problem Relation Age of Onset  . Hypertension Mother   . Parkinson's disease Mother   . Emphysema Father   . Diabetes Sister     Social History: Social History  Substance Use Topics  . Smoking status: Never Smoker   . Smokeless tobacco: None  . Alcohol Use: No     Review of Systems:   10 point review of systems was performed and was otherwise negative:  Constitutional: No fever Eyes: No visual disturbances ENT: No sore throat, ear pain Cardiac: Chest pain is sharp in nature wax and wanes in its intensity. No obvious exacerbating or relieving factors Respiratory: No shortness of breath, wheezing, or stridor Abdomen: Pain toward her right lower quadrant but occasionally is when the left lower quadrant. No nausea, vomiting. Endocrine: No weight loss, No night sweats Extremities: No peripheral edema, cyanosis Skin: No rashes, easy bruising Neurologic: No focal weakness, no trouble with speech or swallowing Urologic: No dysuria, Hematuria, or urinary frequency   Physical Exam:  ED Triage Vitals  Enc Vitals Group     BP 03/16/15 0933 118/45 mmHg     Pulse Rate 03/16/15 0933 60     Resp 03/16/15 0933 20     Temp 03/16/15 0933 97.7 F (36.5 C)     Temp Source 03/16/15 0933 Oral     SpO2 03/16/15 0933 97 %     Weight 03/16/15 0933 195 lb (88.451 kg)     Height 03/16/15 0933  (1.549 m)     Head Cir --      Peak Flow --      Pain Score 03/16/15 0936 7     Pain Loc --      Pain Edu? --      Excl. in GC? --     General: Awake , Alert , and Oriented times 3; GCS 15. Somewhat lethargic. Head: Normal cephalic , atraumatic Eyes: Pupils equal , round, reactive to light Nose/Throat: No nasal drainage, patent upper airway without erythema or exudate.  Neck: Supple, Full range of motion, No anterior adenopathy or palpable thyroid masses Lungs: Clear to ascultation without wheezes , rhonchi, or rales Heart: Regular rate, regular rhythm without murmurs , gallops ,  or  rubs Abdomen: Mild tenderness to the right lower quadrant, soft without rebound, guarding , or rigidity; bowel sounds positive and symmetric in all 4 quadrants. No organomegaly .      Negative Murphy's sign, and no focal tenderness over McBurney's point  Extremities: 2 plus symmetric pulses. No edema, clubbing or cyanosis Neurologic: normal ambulation, Motor symmetric without deficits, sensory intact Skin: warm, dry, no rashes   Labs:   All laboratory work was reviewed including any pertinent negatives or positives listed below:  Labs Reviewed  URINALYSIS COMPLETEWITH MICROSCOPIC ([facility] ONLY) - Abnormal; Notable for the following:    Color, Urine YELLOW (*)    APPearance HAZY (*)    Protein, ur 100 (*)    Bacteria, UA RARE (*)    All other components within normal limits  CBC WITH DIFFERENTIAL/PLATELET - Abnormal; Notable for the following:    Lymphs Abs 0.9 (*)    All other components within normal limits  COMPREHENSIVE METABOLIC PANEL - Abnormal; Notable for the following:    Glucose, Bld 138 (*)    BUN 47 (*)    Creatinine, Ser 1.37 (*)    Calcium 10.5 (*)    GFR calc non Af Amer 38 (*)    GFR calc Af Amer 43 (*)    All other components within normal limits  BRAIN NATRIURETIC PEPTIDE - Abnormal; Notable for the following:    B Natriuretic Peptide 278.0 (*)    All other components within normal limits  TROPONIN I  TSH  TROPONIN I  TROPONIN I  TROPONIN I  BLOOD GAS, ARTERIAL  LIPID PANEL  CBC  BASIC METABOLIC PANEL   review laboratory work showed no significant findings  EKG: ED ECG REPORT I, Jennye Moccasin, the attending physician, personally viewed and interpreted this ECG.  Date: 03/16/2015 EKG Time: *0932. Rate: 60 Rhythm: normal sinus rhythm QRS Axis: normal Intervals: normal ST/T Wave abnormalities: Nonspecific ST-T wave abnormalities Conduction Disutrbances: none Narrative Interpretation: unremarkable   Radiology:      EXAM: CT HEAD WITHOUT  CONTRAST  TECHNIQUE: Contiguous axial images were obtained from the base of the skull through the vertex without intravenous contrast.  COMPARISON: 01/15/2014  FINDINGS: Generalized atrophy.  Normal ventricular morphology.  No midline shift or mass effect.  Small vessel chronic ischemic changes of deep cerebral white matter.  Old LEFT frontal lobe infarct.  No intracranial hemorrhage, mass lesion, or evidence acute infarction.  No extra-axial fluid collections.  Visualized paranasal sinuses and mastoid air cells clear.  Bones unremarkable.  IMPRESSION: Atrophy with minimal small vessel chronic ischemic changes of deep cerebral white matter.  Old LEFT frontal lobe infarct.  No acute intracranial abnormalities.   Electronically Signed By: Ulyses Southward M.D. On: 03/16/2015 14:33          CT Abdomen Pelvis Wo Contrast (Final result) Result time: 03/16/15 14:38:50   Final result by Rad Results In Interface (03/16/15 14:38:50)   Narrative:   CLINICAL DATA: Abdominal pain  EXAM: CT ABDOMEN AND PELVIS WITHOUT CONTRAST  TECHNIQUE: Multidetector CT imaging of the abdomen and pelvis was performed following the standard protocol without IV contrast.  COMPARISON: CT abdomen pelvis 09/26/2012  FINDINGS: Lower chest: Lung bases clear. Cardiac enlargement. Pacemaker leads noted.  Hepatobiliary: Normal liver. Distended gallbladder without gallbladder wall thickening or stone. No biliary dilatation  Pancreas: Negative  Spleen: Negative  Adrenals/Urinary Tract: Negative for renal mass or obstruction. Left lateral renal cyst measures 3.8 cm and is larger compared with the  prior study. No renal calculi. Urinary bladder decompressed with a Foley catheter.  Stomach/Bowel: Negative for bowel obstruction or bowel edema. Sigmoid mild diverticulosis without diverticulitis. Moderate stool in the colon. Appendix not visualized.  Vascular/Lymphatic:  Atherosclerotic disease in the aorta and iliacs without aneurysm. Negative for adenopathy.  Reproductive: Hysterectomy. No pelvic mass.  Other: Negative for ascites.  Ventral hernia in the lower anterior abdominal wall. No surrounding edema or fluid identified as seen previously.  Musculoskeletal: Disc degeneration and spurring. No acute skeletal abnormality.  IMPRESSION: Constipation without bowel obstruction.  Atherosclerotic aorta  Ventral hernia lower anterior abdominal wall   Electronically Signed By: Marlan Palau M.D. On: 03/16/2015 14:38          DG Chest 2 View (Final result) Result time: 03/16/15 11:23:54   Final result by Rad Results In Interface (03/16/15 11:23:54)   Narrative:   CLINICAL DATA: Short of breath. Left chest pain  EXAM: CHEST 2 VIEW  COMPARISON: 01/15/2014  FINDINGS: Cardiac enlargement. Mild vascular congestion without edema or effusion. Dual lead pacemaker unchanged. Negative for pneumonia.  IMPRESSION: Cardiac enlargement with mild vascular congestion. Negative for edema.   Electronically Signed By: Marlan Palau M.D. On: 03/16/2015 11:23       I personally reviewed the radiologic studies     ED Course:  Patient was given some IV fluids and appears to be dehydrated. She has numerous past medical issues. I cannot locate any new path pathology to explain her generalized weakness. Her head CT shows no new acute ischemic changes, chest x-ray and cardiac evaluation with EKG and troponin were negative. Patient seems to have generalized weakness at this time of unknown etiology. Her daughter was concerned about taking the patient home without a specific diagnosis and treatment plan. Her differential included the causes for acute chest discomfort without any obvious significant etiology at this time. Her headache seems to be mild and bilateral temporal region abdominal CT shows no obvious acute surgical findings or  bowel obstruction, etc.  Assessment: Generalized weakness unspecified Acute unspecified chest pain  Final Clinical Impression:  Final diagnoses:  Weakness generalized     Plan: Inpatient management I spoke to the hospitalist team, further disposition and management depends upon her evaluation            Jennye Moccasin, MD 03/16/15 1753

## 2015-03-16 NOTE — H&P (Addendum)
[facility] [place] [facility]   PATIENT NAME: Sally Curry    MR#:  [id number]  DATE OF BIRTH:  05/19/1941  DATE OF ADMISSION:  03/16/2015  PRIMARY CARE PHYSICIAN: Clide Dales, FNP   REQUESTING/REFERRING PHYSICIAN: Dr Huel Cote  CHIEF COMPLAINT:   Chief Complaint  Patient presents with  . Chest Pain    HISTORY OF PRESENT ILLNESS:  Sally Curry  is a 74 y.o. female with a known history of hypertension, obesity, CKD3, stroke, coronary artery disease status post MI, obesity who presents from home due to left sided chest pain and left arm pain which has been going on now for about 24 hours. The history is provided by her daughter as the patient is fairly somnolent. She states that 3 days ago the patient was feeling well and went to the fair. Since that time she has seemed very fatigued. Yesterday afternoon she began to complain of left-sided chest pain radiating to the left arm as well as a headache. She was up all night with pain, dizziness and persistent headache. She has not had any diaphoresis nausea vomiting or shortness of breath. Today she is so fatigued that she cannot walk more than a few feet without resting. At baseline she is able to perform her own ADLs and even walk up a flight of 20 stairs. She is being admitted for ACS rule out.  PAST MEDICAL HISTORY:   Past Medical History  Diagnosis Date  . CAD (coronary artery disease)   . Hypertension   . Stroke (HCC)   . Gout   . PVD (peripheral vascular disease) (HCC)   . Anemia   . Chronic kidney disease   . Anxiety   . CHF (congestive heart failure) (HCC)   . Depression   . Glaucoma     PAST SURGICAL HISTORY:   Past Surgical History  Procedure Laterality Date  . Coronary angioplasty with stent placement    . Pacemaker insertion    . Cholecystectomy    . Carotid endarterectomy    . Percutaneous placement intravascular stent cervical carotid artery    . Appendectomy      SOCIAL  HISTORY:   Social History  Substance Use Topics  . Smoking status: Never Smoker   . Smokeless tobacco: Not on file  . Alcohol Use: No    FAMILY HISTORY:   Family History  Problem Relation Age of Onset  . Hypertension Mother   . Parkinson's disease Mother   . Emphysema Father   . Diabetes Sister      Mother with hypertension hyperlipidemia and Parkinson's disease. Father with neuropathy and emphysema. Siblings with diabetes.  DRUG ALLERGIES:  No Known Allergies  REVIEW OF SYSTEMS:   ROS unable to obtain due to patient altered mental status, lethargy  MEDICATIONS AT HOME:   Prior to Admission medications   Medication Sig Start Date End Date Taking? Authorizing Provider  allopurinol (ZYLOPRIM) 100 MG tablet Take 100 mg by mouth daily.   Yes Historical Provider, MD  amLODipine (NORVASC) 10 MG tablet Take 1 tablet by mouth daily. 06/24/14  Yes Historical Provider, MD  clopidogrel (PLAVIX) 75 MG tablet Take 75 mg by mouth daily.   Yes Historical Provider, MD  gemfibrozil (LOPID) 600 MG tablet Take 600 mg by mouth 2 (two) times daily before a meal.   Yes Historical Provider, MD  hydrALAZINE (APRESOLINE) 100 MG tablet Take 100 mg by mouth 3 (three) times daily.   Yes Historical Provider, MD  isosorbide mononitrate (IMDUR) 60 MG 24 hr tablet Take 60 mg by mouth daily.   Yes Historical Provider, MD  lovastatin (MEVACOR) 40 MG tablet Take 40 mg by mouth at bedtime.   Yes Historical Provider, MD  metoprolol succinate (TOPROL-XL) 50 MG 24 hr tablet Take 50 mg by mouth daily. Take with or immediately following a meal.   Yes Historical Provider, MD  Multiple Vitamin tablet Take 1 tablet by mouth daily.   Yes Historical Provider, MD  omeprazole (PRILOSEC) 40 MG capsule Take 40 mg by mouth daily.   Yes Historical Provider, MD  pantoprazole (PROTONIX) 40 MG tablet Take 40 mg by mouth 2 (two) times daily.    Yes Historical Provider, MD  PARoxetine (PAXIL) 30 MG tablet Take 30 mg by mouth  daily.   Yes Historical Provider, MD  potassium chloride SA (K-DUR,KLOR-CON) 20 MEQ tablet Take 20 mEq by mouth daily.   Yes Historical Provider, MD      VITAL SIGNS:  Blood pressure 131/63, pulse 38, temperature 98.3 F (36.8 C), temperature source Rectal, resp. rate 17, height 5\' 1"  (1.549 m), weight 88.451 kg (195 lb), SpO2 94 %.  PHYSICAL EXAMINATION:  GENERAL:  74 y.o.-year-old patient lying in the bed with no acute distress. Somnolent, obese EYES: Pupils equal, round, reactive to light and accommodation with post surgical changes. No scleral icterus. Extraocular muscles intact.  HEENT: Head atraumatic, normocephalic. Oropharynx and nasopharynx clear. Oral mucous membranes are dry NECK:  Supple, no jugular venous distention. No thyroid enlargement, no tenderness.  LUNGS: Normal breath sounds bilaterally, no wheezing, rales,rhonchi or crepitation. No use of accessory muscles of respiration.  CARDIOVASCULAR: S1, S2 normal. No murmurs, rubs, or gallops.  ABDOMEN: Soft, nontender, nondistended. Bowel sounds present. No organomegaly or mass. No guarding no rebound EXTREMITIES: No pedal edema, cyanosis, or clubbing. Peripheral pulses diminished at 1+ NEUROLOGIC: Cranial nerves II through XII are mostly intact. Muscle strength 5/5 in all extremities. Sensation intact. Gait not checked.  PSYCHIATRIC: The patient is alert and oriented x 3. Fatigued, lethargic SKIN: Senile purpura over both forearms, a bluish discoloration over the right lower leg which is chronic  LABORATORY PANEL:   CBC  Recent Labs Lab 03/16/15 1006  WBC 8.5  HGB 13.4  HCT 40.8  PLT 254   ------------------------------------------------------------------------------------------------------------------  Chemistries   Recent Labs Lab 03/16/15 1006  NA 144  K 3.7  CL 110  CO2 24  GLUCOSE 138*  BUN 47*  CREATININE 1.37*  CALCIUM 10.5*  AST 27  ALT 15  ALKPHOS 105  BILITOT 0.7    ------------------------------------------------------------------------------------------------------------------  Cardiac Enzymes  Recent Labs Lab 03/16/15 1006  TROPONINI <0.03   ------------------------------------------------------------------------------------------------------------------  RADIOLOGY:  Ct Abdomen Pelvis Wo Contrast  03/16/2015  CLINICAL DATA:  Abdominal pain EXAM: CT ABDOMEN AND PELVIS WITHOUT CONTRAST TECHNIQUE: Multidetector CT imaging of the abdomen and pelvis was performed following the standard protocol without IV contrast. COMPARISON:  CT abdomen pelvis 09/26/2012 FINDINGS: Lower chest: Lung bases clear. Cardiac enlargement. Pacemaker leads noted. Hepatobiliary: Normal liver. Distended gallbladder without gallbladder wall thickening or stone. No biliary dilatation Pancreas: Negative Spleen: Negative Adrenals/Urinary Tract: Negative for renal mass or obstruction. Left lateral renal cyst measures 3.8 cm and is larger compared with the prior study. No renal calculi. Urinary bladder decompressed with a Foley catheter. Stomach/Bowel: Negative for bowel obstruction or bowel edema. Sigmoid mild diverticulosis without diverticulitis. Moderate stool in the colon. Appendix not visualized. Vascular/Lymphatic: Atherosclerotic disease in the aorta and iliacs without aneurysm. Negative  for adenopathy. Reproductive: Hysterectomy.  No pelvic mass. Other: Negative for ascites. Ventral hernia in the lower anterior abdominal wall. No surrounding edema or fluid identified as seen previously. Musculoskeletal: Disc degeneration and spurring. No acute skeletal abnormality. IMPRESSION: Constipation without bowel obstruction. Atherosclerotic aorta Ventral hernia lower anterior abdominal wall Electronically Signed   By: Marlan Palau M.D.   On: 03/16/2015 14:38   Dg Chest 2 View  03/16/2015  CLINICAL DATA:  Short of breath.  Left chest pain EXAM: CHEST  2 VIEW COMPARISON:  01/15/2014  FINDINGS: Cardiac enlargement. Mild vascular congestion without edema or effusion. Dual lead pacemaker unchanged. Negative for pneumonia. IMPRESSION: Cardiac enlargement with mild vascular congestion. Negative for edema. Electronically Signed   By: Marlan Palau M.D.   On: 03/16/2015 11:23   Ct Head Wo Contrast  03/16/2015  CLINICAL DATA:  LEFT side chest pain radiating down LEFT arm intermittently since last night, weakness, decreased energy, with off media, headaches, coronary artery disease, hypertension, prior stroke, CHF EXAM: CT HEAD WITHOUT CONTRAST TECHNIQUE: Contiguous axial images were obtained from the base of the skull through the vertex without intravenous contrast. COMPARISON:  01/15/2014 FINDINGS: Generalized atrophy. Normal ventricular morphology. No midline shift or mass effect. Small vessel chronic ischemic changes of deep cerebral white matter. Old LEFT frontal lobe infarct. No intracranial hemorrhage, mass lesion, or evidence acute infarction. No extra-axial fluid collections. Visualized paranasal sinuses and mastoid air cells clear. Bones unremarkable. IMPRESSION: Atrophy with minimal small vessel chronic ischemic changes of deep cerebral white matter. Old LEFT frontal lobe infarct. No acute intracranial abnormalities. Electronically Signed   By: Ulyses Southward M.D.   On: 03/16/2015 14:33    EKG:   Orders placed or performed during the hospital encounter of 03/16/15  . EKG 12-Lead  . EKG 12-Lead    IMPRESSION AND PLAN:   #1 chest pain with history of coronary artery disease status post MI: Dr. Lady Gary is her cardiologist. Will cycle troponins and monitor on telemetry. We'll obtain a 2-D echocardiogram. Note that her BNP is slightly elevated and her chest x-ray shows cardiac enlargement with pulmonary vascular congestion. This may be a new diagnosis of congestive heart failure. We will check lipids in the morning. Continue Plavix and statin.  #2 weakness: Etiology at this point is  not clear. TSH is normal. UA negative for infection. Chest x-ray does show edema but no evidence of pneumonia. Her white count is normal no fever no indication for infection. Renal function is stable. CT of the head shows atrophy, chronic ischemic changes, old left frontal infarct no acute intracranial abnormalities. I'll check an ABG. We'll obtain physical therapy consultation for tomorrow.  #3 depression: The daughter states that the patient is chronically depressed but this has not changed recently. Continue Paxil. If no improvement and other medical conditions ruled out would consider psychiatric consultation.  #4 hypertension:  We'll hold antihypertensives as her blood pressure slightly low at this time.  #5 CK D3: Stable. No changes  #6 peptic ulcer disease: Continue Protonix.  All the records are reviewed and case discussed with ED provider. Management plans discussed with the patient, family (daughter) and they are in agreement.  CODE STATUS: Full. This should be discussed with her primary care physician as there was some confusion about this between the daughter and the patient during my conversation with him.  TOTAL TIME TAKING CARE OF THIS PATIENT: 45 minutes.  Greater than 50% of time spent in coordination of care and counseling.  WALSH,  CATHERINE M.D on 03/16/2015 at 4:34 PM  Between 7am to 6pm - Pager - [phone]  After 6pm go to [email or web address] - password [email or web address] [facility]  [facility]  Office  [phone]  CC: Primary care physician; Clide Dales, FNP

## 2015-03-16 NOTE — ED Notes (Signed)
Pt to ed with c/o left sided chest pain that radiates down left arm intermittently since last night.  +weakness, + decreased energy per family, +sob worse with laying down.

## 2015-03-16 NOTE — Progress Notes (Signed)
*  PRELIMINARY RESULTS* Echocardiogram 2D Echocardiogram has been performed.  Sally Curry 03/16/2015, 8:26 PM

## 2015-03-17 LAB — TROPONIN I

## 2015-03-17 LAB — LIPID PANEL
CHOLESTEROL: 150 mg/dL (ref 0–200)
HDL: 41 mg/dL (ref >40)
LDL Cholesterol: 78 mg/dL (ref 0–99)
Total CHOL/HDL Ratio: 3.7 RATIO
Triglycerides: 153 mg/dL — ABNORMAL HIGH (ref <150)
VLDL: 31 mg/dL (ref 0–40)

## 2015-03-17 LAB — CBC
HCT: 34.6 % — ABNORMAL LOW (ref 35.0–47.0)
Hemoglobin: 11.6 g/dL — ABNORMAL LOW (ref 12.0–16.0)
MCH: 32.4 pg (ref 26.0–34.0)
MCHC: 33.6 g/dL (ref 32.0–36.0)
MCV: 96.5 fL (ref 80.0–100.0)
PLATELETS: 198 10*3/uL (ref 150–440)
RBC: 3.59 MIL/uL — ABNORMAL LOW (ref 3.80–5.20)
RDW: 14 % (ref 11.5–14.5)
WBC: 7.4 10*3/uL (ref 3.6–11.0)

## 2015-03-17 LAB — CREATININE, SERUM
Creatinine, Ser: 1.68 mg/dL — ABNORMAL HIGH (ref 0.44–1.00)
GFR calc non Af Amer: 29 mL/min — ABNORMAL LOW (ref >60)
GFR, EST AFRICAN AMERICAN: 34 mL/min — AB (ref >60)

## 2015-03-17 LAB — BASIC METABOLIC PANEL
ANION GAP: 9 (ref 5–15)
BUN: 55 mg/dL — ABNORMAL HIGH (ref 6–20)
CHLORIDE: 113 mmol/L — AB (ref 101–111)
CO2: 21 mmol/L — AB (ref 22–32)
CREATININE: 1.66 mg/dL — AB (ref 0.44–1.00)
Calcium: 9.2 mg/dL (ref 8.9–10.3)
GFR calc non Af Amer: 30 mL/min — ABNORMAL LOW (ref >60)
GFR, EST AFRICAN AMERICAN: 34 mL/min — AB (ref >60)
Glucose, Bld: 128 mg/dL — ABNORMAL HIGH (ref 65–99)
POTASSIUM: 4 mmol/L (ref 3.5–5.1)
SODIUM: 143 mmol/L (ref 135–145)

## 2015-03-17 NOTE — Plan of Care (Signed)
Problem: Phase I Progression Outcomes Goal: Anginal pain relieved Outcome: Progressing Pt did not c/o any pain my shift. Pt HR SB 50s, BP 120-130s/40s, SpO2 >90% on RA, Troponins were neg throughout the night.

## 2015-03-17 NOTE — Evaluation (Signed)
Physical Therapy Evaluation Patient Details Name: Sally Curry MRN: [id number] DOB: 1941/02/24 Today's Date: 03/17/2015   History of Present Illness  Pt arrived with complaints of L sided chest pain, generalized weakness, and lethargy. She was admitted under observation status for the same complaints. PMH: CAD, HTN, CVA with R sided weakness, PVD, CKD, anemia, depression, and s/p pacemaker placement. At baseline pt reports independence with ambulation at home with assistive device. She requires assistance with ADLs/IADLs from family and denies falls in the last 12 months. Pt states that her complaints have resolved at this time  Clinical Impression  Pt reports full resolution of weakness and chest pain at this time. Pt demonstrates baseline bed mobility, transfers, and ambulation. She is steady and stable with ambulation using rolling walker without evidence for loss of balance. Pt has R side deficits from chronic CVA but she has good family support at home. Pt has no needs for PT when she returns home due to resolution of current complaints and deficits being chronic in nature. Pt would benefit from information regarding wearable alarm system for falls given that she spends extended time alone at home. Pt will benefit from skilled PT services to address deficits in strength, balance, and mobility in order to prevent decline while at [facility] and maintain full function at home.     Follow Up Recommendations No PT follow up    Equipment Recommendations   (Information regarding call system for falls)    Recommendations for Other Services       Precautions / Restrictions Precautions Precautions: Fall Restrictions Weight Bearing Restrictions: No      Mobility  Bed Mobility Overal bed mobility: Needs Assistance Bed Mobility: Supine to Sit     Supine to sit: Min guard     General bed mobility comments: Heavy use of bed rails. Pt requires positioning of bed to scoot toward [facility] with use of  rails. Transfer is very slow with patient utilizing LUE for entirety of mobility  Transfers Overall transfer level: Needs assistance Equipment used: Rolling walker (2 wheeled) Transfers: Sit to/from Stand Sit to Stand: Min guard         General transfer comment: Pt demonstrates reasonable safety with transfers although she performs with decreased speed. Heavy reliance on LUE and LLE during transfer. Safe hand placement demontrated and good stability in standing  Ambulation/Gait Ambulation/Gait assistance: Min guard Ambulation Distance (Feet): 150 Feet Assistive device: Rolling walker (2 wheeled) Gait Pattern/deviations: Decreased step length - left;Decreased stance time - right   Gait velocity interpretation: <1.8 ft/sec, indicative of risk for recurrent falls General Gait Details: Pt with absent heel strike on RLE. She utilizes RUE on walker but occasionally takes off due to poor control with RUE. Pt with decreased gait speed but stable during turns and no need for balance correction. Monitored fatigue during ambulation. Pt reports that ambulation has returned to baseline function and she denies any further weakness or chest pain.   Stairs            Wheelchair Mobility    Modified Rankin (Stroke Patients Only)       Balance Overall balance assessment: Needs assistance   Sitting balance-Leahy Scale: Good       Standing balance-Leahy Scale: Fair                 High Level Balance Comments: Pt unable to acheive Rhomberg position without UE support. Once in position is able to perform eyes closed for approxiamtely 10 seconds prior  to openeing eyes and needing UE support             Pertinent Vitals/Pain Pain Assessment: No/denies pain    Home Living Family/patient expects to be discharged to:: Private residence Living Arrangements: Children;Other relatives Available Help at Discharge: Family Type of Home: Other(Comment) (Condo) Home Access: Level  entry     Home Layout: Multi-level;Able to live on main level with bedroom/bathroom;Other (Comment) (commode on main floor, shower on second level) Home Equipment: Walker - 2 wheels;Walker - 4 wheels;Tub bench;Grab bars - tub/shower;Wheelchair - manual (regular bed, no BSC)      Prior Function Level of Independence: Needs assistance   Gait / Transfers Assistance Needed: Independent household ambulation with rollator. Pt requires assistance for stairs  ADL's / Homemaking Assistance Needed: Assist for ADLs        Hand Dominance   Dominant Hand: Left    Extremity/Trunk Assessment   Upper Extremity Assessment: RUE deficits/detail RUE Deficits / Details: LUE grossly 4+/5. RUE weakness s/p CVA. R elbow flexion posturing with 3 tone in elbow flexors. Increased tone in R hand but pt able to flex and extend MP, PIP, and DIP joints fully with adequate grip strength. Denies numbness/tingling in RUE         Lower Extremity Assessment: RLE deficits/detail RLE Deficits / Details: LLE grossly 4+/5 throughout. Pt with at least 4/5 R hip flexion and R knee flexion/extension. Limited R ankle ROM with inability to reach neutral in both loaded and unloaded positions. Pt ambulates with absent heel strike on RLE       Communication   Communication: No difficulties  Cognition Arousal/Alertness: Awake/alert Behavior During Therapy: WFL for tasks assessed/performed Overall Cognitive Status: No family/caregiver present to determine baseline cognitive functioning (AOx2, unaware of year, able to provide month)                      General Comments      Exercises        Assessment/Plan    PT Assessment Patient needs continued PT services  PT Diagnosis Abnormality of gait;Difficulty walking   PT Problem List Decreased strength;Decreased balance  PT Treatment Interventions DME instruction;Gait training;Stair training;Functional mobility training;Therapeutic activities;Therapeutic  exercise;Balance training;Neuromuscular re-education;Patient/family education   PT Goals (Current goals can be found in the Care Plan section) Acute Rehab PT Goals Patient Stated Goal: "I feel really good, I want to go home" PT Goal Formulation: With patient Time For Goal Achievement: 03/31/15 Potential to Achieve Goals: Good    Frequency Min 2X/week   Barriers to discharge Decreased caregiver support Pt is alone from 8A-2P during the week    Co-evaluation               End of Session Equipment Utilized During Treatment: Gait belt Activity Tolerance: Patient tolerated treatment well Patient left: in bed;with call bell/phone within reach;with bed alarm set Nurse Communication:  (Need for clean bed pad, need for breakfast)    Functional Assessment Tool Used: clinical judgement, Rhomberg Functional Limitation: Mobility: Walking and moving around Mobility: Walking and Moving Around Current Status ([id number]): At least 40 percent but less than 60 percent impaired, limited or restricted Mobility: Walking and Moving Around Goal Status [phone]): At least 40 percent but less than 60 percent impaired, limited or restricted    Time: 0938-1000 PT Time Calculation (min) (ACUTE ONLY): 22 min   Charges:   PT Evaluation $Initial PT Evaluation Tier I: 1 Procedure PT Treatments $Gait Training: 8-22 mins  PT G Codes:   PT G-Codes **NOT FOR INPATIENT CLASS** Functional Assessment Tool Used: clinical judgement, Rhomberg Functional Limitation: Mobility: Walking and moving around Mobility: Walking and Moving Around Current Status ([id number]): At least 40 percent but less than 60 percent impaired, limited or restricted Mobility: Walking and Moving Around Goal Status [phone]): At least 40 percent but less than 60 percent impaired, limited or restricted   Lynnea Maizes PT, DPT   Tera Pellicane 03/17/2015, 10:27 AM

## 2015-03-17 NOTE — Progress Notes (Signed)
Subjective: No complaint of chest pain or shortness of breath at this time.  Patient is comfortably lying in bed.  Objective: Vital signs in last 24 hours: Temp:  [97.5 F (36.4 C)-98.4 F (36.9 C)] 98.4 F (36.9 C) (10/19 1206) Pulse Rate:  [38-63] 63 (10/19 1206) Resp:  [13-21] 16 (10/19 1206) BP: (111-148)/(43-67) 143/49 mmHg (10/19 1206) SpO2:  [90 %-98 %] 94 % (10/19 1206) Weight change:  Last BM Date: 03/16/15  Intake/Output from previous day: 10/18 0701 - 10/19 0700 In: 480 [P.O.:480] Out: 685 [Urine:685] Intake/Output this shift:   GENERAL: Alert, oriented, not in acute distress. HEAD: No pallor, no icterus, no nasal congestion. NECK: supple, trachea is central, no thyromegaly, no cervical lymphadenopathy, scar on right neck, s/p right carotid endarterectomy, ++ left carotid bruit. CARDIOVASCULAR: regular rate and rhythm, S1S2, + systolic murmur, radiating to the axilla, audible all over the precordium, no tachycardia or gallop. CHEST: clear to auscultation, no rhonchi, no crackles. Good air entry bilaterally. ABDOMEN: obese, pendulous abdomen, soft, non-tender, no hepatosplenomegaly. EXTREMITIES: chronic bilateral lower leg edema. SKIN: No rash. NEURO: Power 3-4/5 in right upper arm, 4-5/5 in right forearm and hand, Power in RLE is 4/5. Power in LUE and LLE is 5/5. PSYCH: good eye contact, appropriate mood and affect, normal speech  Lab Results:  Recent Labs  03/16/15 1006 03/17/15 0200  WBC 8.5 7.4  HGB 13.4 11.6*  HCT 40.8 34.6*  PLT 254 198   BMET  Recent Labs  03/16/15 1006 03/17/15 0200 03/17/15 0558  NA 144  --  143  K 3.7  --  4.0  CL 110  --  113*  CO2 24  --  21*  GLUCOSE 138*  --  128*  BUN 47*  --  55*  CREATININE 1.37* 1.68* 1.66*  CALCIUM 10.5*  --  9.2    Studies/Results: Ct Abdomen Pelvis Wo Contrast  03/16/2015  CLINICAL DATA:  Abdominal pain EXAM: CT ABDOMEN AND PELVIS WITHOUT CONTRAST TECHNIQUE: Multidetector CT imaging of the  abdomen and pelvis was performed following the standard protocol without IV contrast. COMPARISON:  CT abdomen pelvis 09/26/2012 FINDINGS: Lower chest: Lung bases clear. Cardiac enlargement. Pacemaker leads noted. Hepatobiliary: Normal liver. Distended gallbladder without gallbladder wall thickening or stone. No biliary dilatation Pancreas: Negative Spleen: Negative Adrenals/Urinary Tract: Negative for renal mass or obstruction. Left lateral renal cyst measures 3.8 cm and is larger compared with the prior study. No renal calculi. Urinary bladder decompressed with a Foley catheter. Stomach/Bowel: Negative for bowel obstruction or bowel edema. Sigmoid mild diverticulosis without diverticulitis. Moderate stool in the colon. Appendix not visualized. Vascular/Lymphatic: Atherosclerotic disease in the aorta and iliacs without aneurysm. Negative for adenopathy. Reproductive: Hysterectomy.  No pelvic mass. Other: Negative for ascites. Ventral hernia in the lower anterior abdominal wall. No surrounding edema or fluid identified as seen previously. Musculoskeletal: Disc degeneration and spurring. No acute skeletal abnormality. IMPRESSION: Constipation without bowel obstruction. Atherosclerotic aorta Ventral hernia lower anterior abdominal wall Electronically Signed   By: Marlan Palauharles  Clark M.D.   On: 03/16/2015 14:38   Dg Chest 2 View  03/16/2015  CLINICAL DATA:  Short of breath.  Left chest pain EXAM: CHEST  2 VIEW COMPARISON:  01/15/2014 FINDINGS: Cardiac enlargement. Mild vascular congestion without edema or effusion. Dual lead pacemaker unchanged. Negative for pneumonia. IMPRESSION: Cardiac enlargement with mild vascular congestion. Negative for edema. Electronically Signed   By: Marlan Palauharles  Clark M.D.   On: 03/16/2015 11:23   Ct Head Wo Contrast  03/16/2015  CLINICAL DATA:  LEFT side chest pain radiating down LEFT arm intermittently since last night, weakness, decreased energy, with off media, headaches, coronary artery  disease, hypertension, prior stroke, CHF EXAM: CT HEAD WITHOUT CONTRAST TECHNIQUE: Contiguous axial images were obtained from the base of the skull through the vertex without intravenous contrast. COMPARISON:  01/15/2014 FINDINGS: Generalized atrophy. Normal ventricular morphology. No midline shift or mass effect. Small vessel chronic ischemic changes of deep cerebral white matter. Old LEFT frontal lobe infarct. No intracranial hemorrhage, mass lesion, or evidence acute infarction. No extra-axial fluid collections. Visualized paranasal sinuses and mastoid air cells clear. Bones unremarkable. IMPRESSION: Atrophy with minimal small vessel chronic ischemic changes of deep cerebral white matter. Old LEFT frontal lobe infarct. No acute intracranial abnormalities. Electronically Signed   By: Ulyses Southward M.D.   On: 03/16/2015 14:33    Medications:  Scheduled Meds: . allopurinol  100 mg Oral Daily  . clopidogrel  75 mg Oral Daily  . heparin  5,000 Units Subcutaneous 3 times per day  . isosorbide mononitrate  60 mg Oral Daily  . metoprolol succinate  50 mg Oral Daily  . pantoprazole  40 mg Oral BID  . PARoxetine  30 mg Oral Daily  . potassium chloride SA  20 mEq Oral Daily  . pravastatin  40 mg Oral q1800   Continuous Infusions:  PRN Meds:.acetaminophen, ondansetron (ZOFRAN) IV   Assessment/Plan: 74 year old lady who is a resident of [address] (assisted living facility).  Patient presented with left-sided chest pain.  Initial set of cardiac enzymes was negative. CT head was negative for acute abnormality. History of Coronary artery disease s/p PCI in 2012, Sick sinus syndrome, s/p Pacemaker placement in 2012, closely followed by Dr. Lady Gary. 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 She has some residual weakness of right upper arm. She ambulates with a walker at home. She uses a motorized cart to get around at  the assisted living facility.   Chest pain: Serial cardiac enzymes are negative. MI ruled out. Shall continue Plavix, Toprol, pravastatin and Imdur. Daughter is requesting cardiology evaluation by Dr. Lady Gary. ECHO in 12/2012 revealed normal LV function, EF=55-60%, mild RAE, mild TI and AI, mild pulmonary HTN. Cardiology consult.   Old CVA with residual weakness of right upper extremity: continue Plavix and Pravastatin. Schedule vascular follow-up as outpatient.  Stage 3 chronic kidney disease:  Monitor electrolytes and renal function.  DVT prophylaxis: with subcutaneous heparin.  GI prophylaxis: with Pantoprazole.    Tilden Broz 03/17/2015, 1:04 PM

## 2015-03-17 NOTE — Care Management (Signed)
CM assessment due to age and medial diagnosis.  Patient with strong cardiac history admitted for chest pain.  Patient is independent in her alds.  She declines to converse any further as she is very tired and asked CM to come back at a later time.  At present after review of the medial record and discussion with nursing, have not identified any discharge needs.  CM will speak with patient again

## 2015-03-17 NOTE — Progress Notes (Signed)
Patient alert and oriented x4, no complaints at this time. vss at this time. Patient SB on telemetry. Will continue to assess. Sally KusterBrandi R Curry

## 2015-03-17 NOTE — Consult Note (Signed)
[facility] CLINIC CARDIOLOGY [facility]  CARDIOLOGY CONSULT NOTE  Patient ID: Sally Curry MRN: [id number] DOB/AGE: 31-Aug-1940 74 y.o.  Admit date: 03/16/2015 Referring Physician Thedore Mins Primary Physician Thedore Mins Primary Cardiologist Pavle Wiler Reason for Consultation atypical chest pain  HPI: Patient is a 74 year old female who was admitted with chest pain with both typical and atypical features. She moved to [place] from [place] in 2014. At that time she reported that she had had stents done in [place]. She was admitted after being driven from [place] to [place]. She ruled out for myocardial infarction. She had abdominal pain which was evaluated. She follows up with her primary care provider. During her hospitalization in 2014 she was evaluated by [place] and noted to have a normal LV function on echo with no significant evidence of ischemia. She is ruled out for myocardial infarction. Electrocardiogram was unremarkable. Echocardiogram is unchanged from previous echo showing preserved LV function. She continues to have episodes of exertional chest pain. These episodes have features similar to her angina.  ROS Review of Systems - History obtained from chart review and the patient General ROS: positive for  - Chest pain and fatigue Respiratory ROS: positive for - shortness of breath Cardiovascular ROS: positive for - chest pain Gastrointestinal ROS: no abdominal pain, change in bowel habits, or black or bloody stools Musculoskeletal ROS: negative Neurological ROS: no TIA or stroke symptoms   Past Medical History  Diagnosis Date  . CAD (coronary artery disease)   . Hypertension   . Stroke (HCC)   . Gout   . PVD (peripheral vascular disease) (HCC)   . Anemia   . Chronic kidney disease   . Anxiety   . CHF (congestive heart failure) (HCC)   . Depression   . Glaucoma     Family History  Problem Relation Age of Onset  . Hypertension Mother   .  Parkinson's disease Mother   . Emphysema Father   . Diabetes Sister     Social History   Social History  . Marital Status: Married    Spouse Name: N/A  . Number of Children: N/A  . Years of Education: N/A   Occupational History  . Not on file.   Social History Main Topics  . Smoking status: Never Smoker   . Smokeless tobacco: Not on file  . Alcohol Use: No  . Drug Use: No  . Sexual Activity: Not on file   Other Topics Concern  . Not on file   Social History Narrative    Past Surgical History  Procedure Laterality Date  . Coronary angioplasty with stent placement    . Pacemaker insertion    . Cholecystectomy    . Carotid endarterectomy    . Percutaneous placement intravascular stent cervical carotid artery    . Appendectomy       Prescriptions prior to admission  Medication Sig Dispense Refill Last Dose  . allopurinol (ZYLOPRIM) 100 MG tablet Take 100 mg by mouth daily.   03/16/2015 at Unknown time  . amLODipine (NORVASC) 10 MG tablet Take 1 tablet by mouth daily.   03/16/2015 at Unknown time  . clopidogrel (PLAVIX) 75 MG tablet Take 75 mg by mouth daily.   03/16/2015 at Unknown time  . gemfibrozil (LOPID) 600 MG tablet Take 600 mg by mouth 2 (two) times daily before a meal.   03/16/2015 at Unknown time  . hydrALAZINE (APRESOLINE) 100 MG tablet Take 100 mg by mouth 3 (three) times daily.  03/16/2015 at Unknown time  . isosorbide mononitrate (IMDUR) 60 MG 24 hr tablet Take 60 mg by mouth daily.   03/16/2015 at Unknown time  . lovastatin (MEVACOR) 40 MG tablet Take 40 mg by mouth at bedtime.   03/15/2015 at Unknown time  . metoprolol succinate (TOPROL-XL) 50 MG 24 hr tablet Take 50 mg by mouth daily. Take with or immediately following a meal.   03/16/2015 at Unknown time  . Multiple Vitamin tablet Take 1 tablet by mouth daily.   03/16/2015 at Unknown time  . omeprazole (PRILOSEC) 40 MG capsule Take 40 mg by mouth daily.   03/16/2015 at Unknown time  . pantoprazole  (PROTONIX) 40 MG tablet Take 40 mg by mouth 2 (two) times daily.    Past Week at Unknown time  . PARoxetine (PAXIL) 30 MG tablet Take 40 mg by mouth at bedtime.    03/15/2015 at Unknown time  . potassium chloride SA (K-DUR,KLOR-CON) 20 MEQ tablet Take 20 mEq by mouth 2 (two) times daily.    03/15/2015 at 8pm    Physical Exam: Blood pressure 143/49, pulse 63, temperature 98.4 F (36.9 C), temperature source Oral, resp. rate 16, height 5\' 1"  (1.549 m), weight 88.451 kg (195 lb), SpO2 94 %.    General appearance: alert and cooperative Head: Normocephalic, without obvious abnormality, atraumatic Resp: clear to auscultation bilaterally Chest wall: no tenderness Cardio: regular rate and rhythm GI: soft, non-tender; bowel sounds normal; no masses,  no organomegaly Extremities: extremities normal, atraumatic, no cyanosis or edema Pulses: 2+ and symmetric Neurologic: Grossly normal Labs:   Lab Results  Component Value Date   WBC 7.4 03/17/2015   HGB 11.6* 03/17/2015   HCT 34.6* 03/17/2015   MCV 96.5 03/17/2015   PLT 198 03/17/2015    Recent Labs Lab 03/16/15 1006  03/17/15 0558  NA 144  --  143  K 3.7  --  4.0  CL 110  --  113*  CO2 24  --  21*  BUN 47*  --  55*  CREATININE 1.37*  < > 1.66*  CALCIUM 10.5*  --  9.2  PROT 7.9  --   --   BILITOT 0.7  --   --   ALKPHOS 105  --   --   ALT 15  --   --   AST 27  --   --   GLUCOSE 138*  --  128*  < > = values in this interval not displayed. Lab Results  Component Value Date   TROPONINI <0.03 03/17/2015      Radiology: Borderline pulmonary vascular engorgement EKG: Sinus rhythm with no ischemia  ASSESSMENT AND PLAN:  74 year old female [place] of apparent coronary [place] to [place]. She was admitted with chest pain with both typical and atypical features. She is ruled out for myocardial infarction. Echocardiogram is unremarkable. Chest x-ray does not reveal any acute process. Echocardiogram  reveals normal LV function with no appreciable change from previous echo. Pain persists. Discussed with the patient's daughter who is concerned about further workup of her chest pain. We'll proceed with a Lexiscan sestamibi in the morning. At this positive would proceed with left cardiac catheterization otherwise would consider discharge with outpatient follow-up. We'll continue with current medications for now. Signed: Dalia HeadingFATH,Najee Cowens A. MD, [facility] 03/17/2015, 1:35 PM

## 2015-03-18 ENCOUNTER — Encounter: Payer: Medicare Other | Attending: Cardiology

## 2015-03-18 ENCOUNTER — Encounter: Payer: Self-pay | Admitting: Radiology

## 2015-03-18 ENCOUNTER — Observation Stay: Payer: Medicare Other

## 2015-03-18 DIAGNOSIS — R0789 Other chest pain: Secondary | ICD-10-CM | POA: Insufficient documentation

## 2015-03-18 DIAGNOSIS — R079 Chest pain, unspecified: Secondary | ICD-10-CM | POA: Insufficient documentation

## 2015-03-18 LAB — NM MYOCAR MULTI W/SPECT W/WALL MOTION / EF
CHL CUP NUCLEAR SSS: 0
CHL CUP RESTING HR STRESS: 67 {beats}/min
CHL CUP STRESS STAGE 1 HR: 67 {beats}/min
CHL CUP STRESS STAGE 1 SPEED: 0 mph
CHL CUP STRESS STAGE 2 GRADE: 0 %
CHL CUP STRESS STAGE 3 GRADE: 0 %
CHL CUP STRESS STAGE 3 SPEED: 0 mph
CHL CUP STRESS STAGE 4 GRADE: 0 %
CHL CUP STRESS STAGE 4 HR: 69 {beats}/min
CHL CUP STRESS STAGE 4 SPEED: 0 mph
CHL CUP STRESS STAGE 5 DBP: 46 mmHg
CHL CUP STRESS STAGE 5 GRADE: 0 %
CHL CUP STRESS STAGE 5 HR: 79 {beats}/min
CHL CUP STRESS STAGE 5 SBP: 134 mmHg
CSEPPMHR: 46 %
Estimated workload: 1 METS
LV dias vol: 97 mL
LV sys vol: 48 mL
Peak HR: 69 {beats}/min
SDS: 0
SRS: 0
Stage 1 Grade: 0 %
Stage 2 HR: 64 {beats}/min
Stage 2 Speed: 0 mph
Stage 3 HR: 64 {beats}/min
Stage 5 Speed: 0 mph
TID: 0.93

## 2015-03-18 MED ORDER — TECHNETIUM TC 99M SESTAMIBI - CARDIOLITE
10.0000 | Freq: Once | INTRAVENOUS | Status: AC | PRN
  Administered 2015-03-18: 13.54 via INTRAVENOUS

## 2015-03-18 MED ORDER — REGADENOSON 0.4 MG/5ML IV SOLN
0.4000 mg | Freq: Once | INTRAVENOUS | Status: AC
  Administered 2015-03-18: 0.4 mg via INTRAVENOUS

## 2015-03-18 MED ORDER — TECHNETIUM TC 99M SESTAMIBI - CARDIOLITE
29.2700 | Freq: Once | INTRAVENOUS | Status: AC | PRN
  Administered 2015-03-18: 29.27 via INTRAVENOUS

## 2015-03-18 NOTE — Progress Notes (Signed)
Patient alert and oriented x4, no complaints at this time. vss at this time. Patient SB to SR on telemetry. Will continue to assess. Currently waiting for stress test results. Trudee KusterBrandi R Mansfield

## 2015-03-18 NOTE — Care Management (Signed)
CM made 2 more attempts to speak with patient and she declines because " I am in a bad mood and irritated."  Unable to give specifics.  Attending documents that patient is from assisted living facility.  patient tells CM that she lives with her daughter.  Anticipate discharge if stress test is negative

## 2015-03-18 NOTE — Progress Notes (Signed)
PT Cancellation Note  Patient Details Name: Sally Curry MRN: [id number] DOB: [birth year]   Cancelled Treatment:    Reason Eval/Treat Not Completed: Patient declined, no reason specified. Chart reviewed and RN consulted. Attempted to work with patient but she refuses therapy. Pt is sitting at EOB and is agitated stating that she called for RN 15 minutes ago and no one has assisted her to the bathroom. [facility] but she refuses stating she would prefer to wait for nursing. CNA contacted and notified. Spoke with RN who reports that pt has been forgetful today. She was with patient 15 minutes ago and pt did not report any needs. Will attempt PT treatment on later date/time as patient is available/willing.  Sharalyn InkJason D Numair Masden PT, DPT   Kamran Coker 03/18/2015, 2:44 PM

## 2015-03-18 NOTE — Progress Notes (Signed)
Subjective: No complaint of chest pain or shortness of breath at this time.  Patient is sitting up in a chair.  Objective: Vital signs in last 24 hours: Temp:  [98.7 F (37.1 C)-98.8 F (37.1 C)] 98.7 F (37.1 C) (10/20 1057) Pulse Rate:  [63-69] 65 (10/20 1057) Resp:  [18-22] 18 (10/20 1057) BP: (137-155)/(41-63) 149/57 mmHg (10/20 1057) SpO2:  [93 %-94 %] 93 % (10/20 0408) Weight change:  Last BM Date: 03/16/15  Intake/Output from previous day: 10/19 0701 - 10/20 0700 In: 240 [P.O.:240] Out: 1200 [Urine:1200] Intake/Output this shift: Total I/O In: 0  Out: 900 [Urine:900] GENERAL: Alert, oriented, not in acute distress. HEAD: No pallor, no icterus, no nasal congestion. NECK: supple, trachea is central, no thyromegaly, no cervical lymphadenopathy, scar on right neck, s/p right carotid endarterectomy, ++ left carotid bruit. CARDIOVASCULAR: regular rate and rhythm, S1S2, + systolic murmur, radiating to the axilla, audible all over the precordium, no tachycardia or gallop. CHEST: clear to auscultation, no rhonchi, no crackles. Good air entry bilaterally. ABDOMEN: obese, pendulous abdomen, soft, non-tender, no hepatosplenomegaly. EXTREMITIES: chronic bilateral lower leg edema. SKIN: No rash. NEURO: Power 3-4/5 in right upper arm, 4-5/5 in right forearm and hand, Power in RLE is 4/5. Power in LUE and LLE is 5/5. PSYCH: good eye contact, appropriate mood and affect, normal speech  Lab Results:  Recent Labs  03/16/15 1006 03/17/15 0200  WBC 8.5 7.4  HGB 13.4 11.6*  HCT 40.8 34.6*  PLT 254 198   BMET  Recent Labs  03/16/15 1006 03/17/15 0200 03/17/15 0558  NA 144  --  143  K 3.7  --  4.0  CL 110  --  113*  CO2 24  --  21*  GLUCOSE 138*  --  128*  BUN 47*  --  55*  CREATININE 1.37* 1.68* 1.66*  CALCIUM 10.5*  --  9.2    Studies/Results: Ct Abdomen Pelvis Wo Contrast  03/16/2015  CLINICAL DATA:  Abdominal pain EXAM: CT ABDOMEN AND PELVIS WITHOUT CONTRAST  TECHNIQUE: Multidetector CT imaging of the abdomen and pelvis was performed following the standard protocol without IV contrast. COMPARISON:  CT abdomen pelvis 09/26/2012 FINDINGS: Lower chest: Lung bases clear. Cardiac enlargement. Pacemaker leads noted. Hepatobiliary: Normal liver. Distended gallbladder without gallbladder wall thickening or stone. No biliary dilatation Pancreas: Negative Spleen: Negative Adrenals/Urinary Tract: Negative for renal mass or obstruction. Left lateral renal cyst measures 3.8 cm and is larger compared with the prior study. No renal calculi. Urinary bladder decompressed with a Foley catheter. Stomach/Bowel: Negative for bowel obstruction or bowel edema. Sigmoid mild diverticulosis without diverticulitis. Moderate stool in the colon. Appendix not visualized. Vascular/Lymphatic: Atherosclerotic disease in the aorta and iliacs without aneurysm. Negative for adenopathy. Reproductive: Hysterectomy.  No pelvic mass. Other: Negative for ascites. Ventral hernia in the lower anterior abdominal wall. No surrounding edema or fluid identified as seen previously. Musculoskeletal: Disc degeneration and spurring. No acute skeletal abnormality. IMPRESSION: Constipation without bowel obstruction. Atherosclerotic aorta Ventral hernia lower anterior abdominal wall Electronically Signed   By: Marlan Palauharles  Clark M.D.   On: 03/16/2015 14:38   Ct Head Wo Contrast  03/16/2015  CLINICAL DATA:  LEFT side chest pain radiating down LEFT arm intermittently since last night, weakness, decreased energy, with off media, headaches, coronary artery disease, hypertension, prior stroke, CHF EXAM: CT HEAD WITHOUT CONTRAST TECHNIQUE: Contiguous axial images were obtained from the base of the skull through the vertex without intravenous contrast. COMPARISON:  01/15/2014 FINDINGS: Generalized atrophy. Normal ventricular  morphology. No midline shift or mass effect. Small vessel chronic ischemic changes of deep cerebral white  matter. Old LEFT frontal lobe infarct. No intracranial hemorrhage, mass lesion, or evidence acute infarction. No extra-axial fluid collections. Visualized paranasal sinuses and mastoid air cells clear. Bones unremarkable. IMPRESSION: Atrophy with minimal small vessel chronic ischemic changes of deep cerebral white matter. Old LEFT frontal lobe infarct. No acute intracranial abnormalities. Electronically Signed   By: Ulyses Southward M.D.   On: 03/16/2015 14:33    Medications:  Scheduled Meds: . allopurinol  100 mg Oral Daily  . clopidogrel  75 mg Oral Daily  . heparin  5,000 Units Subcutaneous 3 times per day  . isosorbide mononitrate  60 mg Oral Daily  . metoprolol succinate  50 mg Oral Daily  . pantoprazole  40 mg Oral BID  . PARoxetine  30 mg Oral Daily  . potassium chloride SA  20 mEq Oral Daily  . pravastatin  40 mg Oral q1800   Continuous Infusions:  PRN Meds:.acetaminophen, ondansetron (ZOFRAN) IV   Assessment/Plan: 74 year old lady who is a resident of [address] (assisted living facility).  Patient presented with left-sided chest pain.  Initial set of cardiac enzymes was negative. CT head was negative for acute abnormality. History of Coronary artery disease s/p PCI in 2012, Sick sinus syndrome, s/p Pacemaker placement in 2012, closely followed by Dr. Lady Gary. 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 She has some residual weakness of right upper arm. She ambulates with a walker at home. She uses a motorized cart to get around at the assisted living facility.   Chest pain: Serial cardiac enzymes are negative. MI ruled out. Shall continue Plavix, Toprol, pravastatin and Imdur. Cardiology evaluation by Dr. Lady Gary appreciated. Repeat echo revealed preserved LV function, no significant change compared to prior echocardiogram.  Myoview report is awaited.  Old CVA with residual weakness of right upper  extremity: continue Plavix and Pravastatin. Carotid stenosis, status post right carotid endarterectomy in 2006, schedule repeat carotid Doppler. Vascular consultation per family's request.  Stage 3 chronic kidney disease:  Monitor electrolytes and renal function.  DVT prophylaxis: with subcutaneous heparin.  GI prophylaxis: with Pantoprazole.    Sally Curry 03/18/2015, 12:46 PM

## 2015-03-19 LAB — CBC WITH DIFFERENTIAL/PLATELET
BASOS PCT: 1 %
Basophils Absolute: 0.1 10*3/uL (ref 0–0.1)
EOS ABS: 0.3 10*3/uL (ref 0–0.7)
EOS PCT: 4 %
HCT: 32.9 % — ABNORMAL LOW (ref 35.0–47.0)
HEMOGLOBIN: 11 g/dL — AB (ref 12.0–16.0)
Lymphocytes Relative: 19 %
Lymphs Abs: 1.6 10*3/uL (ref 1.0–3.6)
MCH: 32.2 pg (ref 26.0–34.0)
MCHC: 33.5 g/dL (ref 32.0–36.0)
MCV: 96.1 fL (ref 80.0–100.0)
MONO ABS: 0.7 10*3/uL (ref 0.2–0.9)
MONOS PCT: 8 %
NEUTROS PCT: 68 %
Neutro Abs: 6.1 10*3/uL (ref 1.4–6.5)
PLATELETS: 183 10*3/uL (ref 150–440)
RBC: 3.42 MIL/uL — ABNORMAL LOW (ref 3.80–5.20)
RDW: 13.8 % (ref 11.5–14.5)
WBC: 8.9 10*3/uL (ref 3.6–11.0)

## 2015-03-19 LAB — BASIC METABOLIC PANEL
Anion gap: 5 (ref 5–15)
BUN: 49 mg/dL — AB (ref 6–20)
CALCIUM: 9.1 mg/dL (ref 8.9–10.3)
CO2: 23 mmol/L (ref 22–32)
CREATININE: 1.4 mg/dL — AB (ref 0.44–1.00)
Chloride: 114 mmol/L — ABNORMAL HIGH (ref 101–111)
GFR calc non Af Amer: 37 mL/min — ABNORMAL LOW (ref >60)
GFR, EST AFRICAN AMERICAN: 42 mL/min — AB (ref >60)
Glucose, Bld: 116 mg/dL — ABNORMAL HIGH (ref 65–99)
Potassium: 4.1 mmol/L (ref 3.5–5.1)
Sodium: 142 mmol/L (ref 135–145)

## 2015-03-19 MED ORDER — AMLODIPINE BESYLATE 2.5 MG PO TABS: 2.5000 mg | ORAL_TABLET | Freq: Every day | ORAL | Status: AC

## 2015-03-19 NOTE — Discharge Summary (Signed)
Physician Discharge Summary  Patient ID: Sally Curry MRN: [id number] DOB/AGE: 73/20/1942 73 y.o.  Admit date: 03/16/2015 Discharge date: 03/19/2015  Admission Diagnoses: Chest pain  Discharge Diagnoses:  Active Problems:   Chest pain, MI ruled out.  Chronic medical problems:  Coronary artery disease status post PCI in 2012, sick sinus syndrome, status post pacemaker placement in 2012. 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  Discharged Condition: stable  Hospital Course: 74 year old lady who is a resident of [address] (assisted living facility). Patient presented with left-sided chest pain. Initial set of cardiac enzymes was negative. CT head was negative for acute abnormality. History of Coronary artery disease s/p PCI in 2012, Sick sinus syndrome, s/p Pacemaker placement in 2012, closely followed by Dr. Lady Gary. 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 She has some residual weakness of right upper arm. She ambulates with a walker at home. She uses a motorized cart to get around at the assisted living facility.   Chest pain: Serial cardiac enzymes are negative. MI was ruled out. Plavix, Toprol, pravastatin and Imdur were continued.   Patient was evaluated by her cardiologist, Dr. Lady Gary. Repeat echo revealed preserved LV function, no significant change compared to prior echocardiogram. Myoview was negative with preserved LV function.  Old CVA with residual weakness of right upper extremity. Carotid stenosis, status post right carotid endarterectomy in 2006. Plavix and pravastatin were continued. Carotid Doppler revealed chronically occluded left internal carotid artery.  Moderate right internal carotid stenosis estimated at 50-69% by ultrasound criteria.   Patient follow-up with vascular surgery as  outpatient.  Stage 3 chronic kidney disease:renal function remained stable.  Patient received DVT prophylaxis with subcutaneous heparin and GI prophylaxis with pantoprazole.  Shall discharge to assisted living facility today.  Consults: cardiology  Discharge Exam: Blood pressure 165/57, pulse 57, temperature 97.6 F (36.4 C), temperature source Oral, resp. rate 16, height  (1.549 m), weight 88.451 kg (195 lb), SpO2 93 %. GENERAL: Alert, oriented, not in acute distress. HEAD: No pallor, no icterus, no nasal congestion. NECK: supple, trachea is central, no thyromegaly, no cervical lymphadenopathy, scar on right neck, s/p right carotid endarterectomy, ++ left carotid bruit. CARDIOVASCULAR: regular rate and rhythm, S1S2, + systolic murmur, radiating to the axilla, audible all over the precordium, no tachycardia or gallop. CHEST: clear to auscultation, no rhonchi, no crackles. Good air entry bilaterally. ABDOMEN: obese, pendulous abdomen, soft, non-tender, no hepatosplenomegaly. EXTREMITIES: chronic bilateral lower leg edema. SKIN: No rash. NEURO: Power 3-4/5 in right upper arm, 4-5/5 in right forearm and hand, Power in RLE is 4/5. Power in LUE and LLE is 5/5. PSYCH: good eye contact, appropriate mood and affect, normal speech  Disposition:      Medication List    STOP taking these medications        hydrALAZINE 100 MG tablet  Commonly known as:  APRESOLINE     omeprazole 40 MG capsule  Commonly known as:  PRILOSEC      TAKE these medications        allopurinol 100 MG tablet  Commonly known as:  ZYLOPRIM  Take 100 mg by mouth daily.     amLODipine 2.5 MG tablet  Commonly known as:  NORVASC  Take 1 tablet (2.5 mg total) by mouth daily.     clopidogrel 75 MG tablet  Commonly known as:  PLAVIX  Take 75 mg by mouth daily.     gemfibrozil 600 MG tablet  Commonly known as:  LOPID  Take 600 mg by mouth 2 (two) times daily before a meal.     isosorbide mononitrate 60 MG  24 hr tablet  Commonly known as:  IMDUR  Take 60 mg by mouth daily.     lovastatin 40 MG tablet  Commonly known as:  MEVACOR  Take 40 mg by mouth at bedtime.     metoprolol succinate 50 MG 24 hr tablet  Commonly known as:  TOPROL-XL  Take 50 mg by mouth daily. Take with or immediately following a meal.     Multiple Vitamin tablet  Take 1 tablet by mouth daily.     pantoprazole 40 MG tablet  Commonly known as:  PROTONIX  Take 40 mg by mouth 2 (two) times daily.     PARoxetine 30 MG tablet  Commonly known as:  PAXIL  Take 40 mg by mouth at bedtime.     potassium chloride SA 20 MEQ tablet  Commonly known as:  K-DUR,KLOR-CON  Take 20 mEq by mouth 2 (two) times daily.         Signed: Juwan Vences 03/19/2015, 12:47 PM

## 2015-03-19 NOTE — Care Management (Signed)
Patient smiling today and pleasant.  Says that she is feeling much better.  States that she is independent at home but daughter helps her with her bath.  Attending has entered order for discharge.  No home health needs identified.

## 2015-03-19 NOTE — H&P (Signed)
[facility] Admission History & Physical  MRN : [id number]  Sally Curry is a 74 y.o. (1940/06/28) female who presents with chief complaint of  Chief Complaint  Patient presents with  . Chest Pain  .  History of Present Illness: Patient presents sent by the dialysis center because she is clotted her access again. Over the last 4 weeks she is thrombosed more than 3 times. She currently follows with [facility] however they have said that they will no longer try to declot her because of her frequent thrombosis and therefore she was sent to [facility]. She does tell me that she is scheduled to follow-up with the surgeon at [facility] who is talking to her about creating a arteriovenous necklace. That appointment is in 2 weeks. At the present time she denies shortness of breath or changes in her usual state of health. No fever chills  Current Facility-Administered Medications  Medication Dose Route Frequency Provider Last Rate Last Dose  . acetaminophen (TYLENOL) tablet 650 mg  650 mg Oral Q4H PRN Gale Journey, MD   650 mg at 03/19/15 1025  . allopurinol (ZYLOPRIM) tablet 100 mg  100 mg Oral Daily Gale Journey, MD   100 mg at 03/19/15 0911  . clopidogrel (PLAVIX) tablet 75 mg  75 mg Oral Daily Gale Journey, MD   75 mg at 03/19/15 0911  . heparin injection 5,000 Units  5,000 Units Subcutaneous 3 times per day Gale Journey, MD   5,000 Units at 03/19/15 0603  . isosorbide mononitrate (IMDUR) 24 hr tablet 60 mg  60 mg Oral Daily Gale Journey, MD   60 mg at 03/19/15 0911  . metoprolol succinate (TOPROL-XL) 24 hr tablet 50 mg  50 mg Oral Daily Gale Journey, MD   50 mg at 03/19/15 0911  . ondansetron (ZOFRAN) injection 4 mg  4 mg Intravenous Q6H PRN Gale Journey, MD      . pantoprazole (PROTONIX) EC tablet 40 mg  40 mg Oral BID Gale Journey, MD   40 mg at 03/19/15 1020  . PARoxetine (PAXIL) tablet 30 mg  30 mg Oral Daily Gale Journey, MD   30 mg at 03/19/15 0911  . potassium chloride SA (K-DUR,KLOR-CON) CR tablet 20 mEq  20 mEq Oral Daily Gale Journey, MD   20 mEq at 03/19/15 0911  . pravastatin (PRAVACHOL) tablet 40 mg  40 mg Oral q1800 Gale Journey, MD   40 mg at 03/18/15 1726    Past Medical History  Diagnosis Date  . CAD (coronary artery disease)   . Hypertension   . Stroke (HCC)   . Gout   . PVD (peripheral vascular disease) (HCC)   . Anemia   . Chronic kidney disease   . Anxiety   . CHF (congestive heart failure) (HCC)   . Depression   . Glaucoma     Past Surgical History  Procedure Laterality Date  . Coronary angioplasty with stent placement    . Pacemaker insertion    . Cholecystectomy    . Carotid endarterectomy    . Percutaneous placement intravascular stent cervical carotid artery    . Appendectomy      Social History Social History  Substance Use Topics  . Smoking status: Never Smoker   . Smokeless tobacco: None  . Alcohol Use: No    Family History Family History  Problem Relation Age of Onset  . Hypertension Mother   .  Parkinson's disease Mother   . Emphysema Father   . Diabetes Sister    no family history of porphyria or autoimmune disease.  No Known Allergies   REVIEW OF SYSTEMS (Negative unless checked)  Constitutional: [] Weight loss  [] Fever  [] Chills Cardiac: [] Chest pain   [] Chest pressure   [] Palpitations   [] Shortness of breath when laying flat   [] Shortness of breath at rest   [] Shortness of breath with exertion. Vascular:  [] Pain in legs with walking   [] Pain in legs at rest   [] Pain in legs when laying flat   [] Claudication   [] Pain in feet when walking  [] Pain in feet at rest  [] Pain in feet when laying flat   [] History of DVT   [] Phlebitis   [] Swelling in legs   [] Varicose veins   [] Non-healing ulcers Pulmonary:   [] Uses home oxygen   [] Productive cough   [] Hemoptysis   [] Wheeze  [] COPD   [] Asthma Neurologic:  [] Dizziness  [] Blackouts   [] Seizures    [] History of stroke   [] History of TIA  [] Aphasia   [] Temporary blindness   [] Dysphagia   [] Weakness or numbness in arms   [] Weakness or numbness in legs Musculoskeletal:  [] Arthritis   [] Joint swelling   [] Joint pain   [] Low back pain Hematologic:  [] Easy bruising  [] Easy bleeding   [] Hypercoagulable state   [] Anemic  [] Hepatitis Gastrointestinal:  [] Blood in stool   [] Vomiting blood  [] Gastroesophageal reflux/heartburn   [] Difficulty swallowing. Genitourinary:  [] Chronic kidney disease   [] Difficult urination  [] Frequent urination  [] Burning with urination   [] Blood in urine Skin:  [] Rashes   [] Ulcers   [] Wounds Psychological:  [] History of anxiety   []  History of major depression.  Physical Examination  Filed Vitals:   03/18/15 2030 03/19/15 0446 03/19/15 1322 03/19/15 1333  BP: 137/47 165/57 173/47 146/47  Pulse: 62 57 56   Temp: 98.6 F (37 C) 97.6 F (36.4 C) 98.7 F (37.1 C)   TempSrc: Oral Oral    Resp: 18 16 18    Height:      Weight:      SpO2: 90% 93% 95%    Body mass index is 36.86 kg/(m^2). Gen: WD/WN, NAD Head: [place]/AT, No temporalis wasting.  Ear/Nose/Throat: Hearing grossly intact, nares w/o erythema or drainage, oropharynx w/o Erythema/Exudate, Eyes: PERRLA, EOMI.  Neck: Supple, no nuchal rigidity.  No bruit or JVD.  Pulmonary:  Good air movement, clear to auscultation bilaterally, no increased work of respiration or use of accessory muscles  Cardiac: RRR, normal S1, S2, no Murmurs, rubs or gallops. Vascular: Patient currently has multiple thrombosed accesses in both arms as well as both legs. Gastrointestinal: soft, non-tender/non-distended. No guarding/reflex. No masses, surgical incisions, or scars. Musculoskeletal: M/S 5/5 throughout.  No deformity or atrophy. Neurologic: CN 2-12 intact. Pain and light touch intact in extremities.  Symmetrical.  Speech is fluent. Motor exam as listed above. Psychiatric: Judgment intact, Mood & affect appropriate for pt's  clinical situation. Dermatologic: No rashes or ulcers noted.  No cellulitis or open wounds. Lymph : No Cervical, Axillary, or Inguinal lymphadenopathy.   CBC Lab Results  Component Value Date   WBC 8.9 03/19/2015   HGB 11.0* 03/19/2015   HCT 32.9* 03/19/2015   MCV 96.1 03/19/2015   PLT 183 03/19/2015    BMET    Component Value Date/Time   NA 142 03/19/2015 0429   NA 142 01/19/2014 0605   K 4.1 03/19/2015 0429   K 4.0 01/19/2014 [id number]  CL 114* 03/19/2015 0429   CL 114* 01/19/2014 0605   CO2 23 03/19/2015 0429   CO2 18* 01/19/2014 0605   GLUCOSE 116* 03/19/2015 0429   GLUCOSE 215* 01/19/2014 0605   BUN 49* 03/19/2015 0429   BUN 39* 01/19/2014 0605   CREATININE 1.40* 03/19/2015 0429   CREATININE 1.37* 01/19/2014 0605   CALCIUM 9.1 03/19/2015 0429   CALCIUM 8.8 01/19/2014 0605   GFRNONAA 37* 03/19/2015 0429   GFRNONAA 39* 01/19/2014 0605   GFRAA 42* 03/19/2015 0429   GFRAA 45* 01/19/2014 0605   Estimated Creatinine Clearance: 36.8 mL/min (by C-G formula based on Cr of 1.4).  COAG Lab Results  Component Value Date   INR 1.2 01/15/2014    Radiology Ct Abdomen Pelvis Wo Contrast  03/16/2015  CLINICAL DATA:  Abdominal pain EXAM: CT ABDOMEN AND PELVIS WITHOUT CONTRAST TECHNIQUE: Multidetector CT imaging of the abdomen and pelvis was performed following the standard protocol without IV contrast. COMPARISON:  CT abdomen pelvis 09/26/2012 FINDINGS: Lower chest: Lung bases clear. Cardiac enlargement. Pacemaker leads noted. Hepatobiliary: Normal liver. Distended gallbladder without gallbladder wall thickening or stone. No biliary dilatation Pancreas: Negative Spleen: Negative Adrenals/Urinary Tract: Negative for renal mass or obstruction. Left lateral renal cyst measures 3.8 cm and is larger compared with the prior study. No renal calculi. Urinary bladder decompressed with a Foley catheter. Stomach/Bowel: Negative for bowel obstruction or bowel edema. Sigmoid mild diverticulosis  without diverticulitis. Moderate stool in the colon. Appendix not visualized. Vascular/Lymphatic: Atherosclerotic disease in the aorta and iliacs without aneurysm. Negative for adenopathy. Reproductive: Hysterectomy.  No pelvic mass. Other: Negative for ascites. Ventral hernia in the lower anterior abdominal wall. No surrounding edema or fluid identified as seen previously. Musculoskeletal: Disc degeneration and spurring. No acute skeletal abnormality. IMPRESSION: Constipation without bowel obstruction. Atherosclerotic aorta Ventral hernia lower anterior abdominal wall Electronically Signed   By: Marlan Palau M.D.   On: 03/16/2015 14:38   Dg Chest 2 View  03/16/2015  CLINICAL DATA:  Short of breath.  Left chest pain EXAM: CHEST  2 VIEW COMPARISON:  01/15/2014 FINDINGS: Cardiac enlargement. Mild vascular congestion without edema or effusion. Dual lead pacemaker unchanged. Negative for pneumonia. IMPRESSION: Cardiac enlargement with mild vascular congestion. Negative for edema. Electronically Signed   By: Marlan Palau M.D.   On: 03/16/2015 11:23   Ct Head Wo Contrast  03/16/2015  CLINICAL DATA:  LEFT side chest pain radiating down LEFT arm intermittently since last night, weakness, decreased energy, with off media, headaches, coronary artery disease, hypertension, prior stroke, CHF EXAM: CT HEAD WITHOUT CONTRAST TECHNIQUE: Contiguous axial images were obtained from the base of the skull through the vertex without intravenous contrast. COMPARISON:  01/15/2014 FINDINGS: Generalized atrophy. Normal ventricular morphology. No midline shift or mass effect. Small vessel chronic ischemic changes of deep cerebral white matter. Old LEFT frontal lobe infarct. No intracranial hemorrhage, mass lesion, or evidence acute infarction. No extra-axial fluid collections. Visualized paranasal sinuses and mastoid air cells clear. Bones unremarkable. IMPRESSION: Atrophy with minimal small vessel chronic ischemic changes of deep  cerebral white matter. Old LEFT frontal lobe infarct. No acute intracranial abnormalities. Electronically Signed   By: Ulyses Southward M.D.   On: 03/16/2015 14:33   US Carotid Bilateral  03/18/2015  CLINICAL DATA:  Carotid atherosclerosis, prior right carotid endarterectomy. EXAM: BILATERAL CAROTID DUPLEX ULTRASOUND TECHNIQUE: Wallace Cullens scale imaging, color Doppler and duplex ultrasound were performed of bilateral carotid and vertebral arteries in the neck. COMPARISON:  None available FINDINGS: Criteria: Quantification of  carotid stenosis is based on velocity parameters that correlate the residual internal carotid diameter with NASCET-based stenosis levels, using the diameter of the distal internal carotid lumen as the denominator for stenosis measurement. The following velocity measurements were obtained: RIGHT ICA:  171/31 cm/sec CCA:  69/ 17 cm/sec SYSTOLIC ICA/CCA RATIO:  2.5 DIASTOLIC ICA/CCA RATIO:  1.9 ECA:  46 cm/sec LEFT ICA:  Chronically occluded CCA:  76/6 cm/sec ECA:  148 cm/sec RIGHT CAROTID ARTERY: Moderate heterogeneous plaque formation with echogenic calcification. Right distal ICA is tortuous but has a moderately elevated velocity measured 171/31. No significant turbulent flow. Degree of stenosis is estimated at 50- 69% by ultrasound criteria. RIGHT VERTEBRAL ARTERY:  Antegrade LEFT CAROTID ARTERY: Extensive atherosclerotic plaque formation. No detectable flow in the ICA compatible with chronic occlusion. Left ECA remains patent. LEFT VERTEBRAL ARTERY:  Antegrade IMPRESSION: Chronically occluded left ICA. Moderate right ICA stenosis estimated at 50- 69% by ultrasound criteria. Preserved patent antegrade vertebral flow bilaterally. Electronically Signed   By: Judie Petit.  Shick M.D.   On: 03/18/2015 17:18   Nm Myocar Multi W/spect W/wall Motion / Ef  03/18/2015   There was no ST segment deviation noted during stress.  The study is normal.  This is a low risk study.  The left ventricular ejection fraction  is normal (55-65%).     Assessment/Plan 1.  Complication dialysis device with thrombosis AV access:  Patient's dialysis access is thrombosed. This is occurred multiple times this month and preparations are all running underway to create a new access. Given that she has missed her dialysis several times this week I will plan for placement of a tunneled catheter. This will bridge her to the creation of her new access. Potassium will be drawn to ensure that it is an appropriate level prior to performing thrombectomy. 2.  End-stage renal disease requiring hemodialysis:  Patient will continue dialysis therapy without further interruption if a successful thrombectomy is not achieved then catheter will be placed. Dialysis has already been arranged since the patient missed their previous session 3.  Hypertension:  Patient will continue medical management; nephrology is following no changes in oral medications. 4.  Coronary artery disease:  EKG will be monitored. Nitrates will be used if needed. The patient's oral cardiac medications will be continued.     Velia Pamer, Latina Craver, MD  03/19/2015 3:11 PM

## 2015-03-19 NOTE — Progress Notes (Signed)
Pt VSS; Pt tolerating diet; Pt discharge orders. Instructions were reviewed with pt with all questions answered. IV removed with dressing dry and intact. Pt daughter came to take pt home. Pt daughter refused wheelchair and pushed pt out on walker.

## 2015-03-19 NOTE — Discharge Instructions (Signed)
Follow up with Dr. Thedore Mins, Dr. Lady Gary  and [facility].  Carotid Angioplasty With Stent Carotid angioplasty is a procedure to widen or open a narrowed artery in the neck (carotid artery) using a small, metal, mesh tube (stent). The stent acts as a splint inside your artery to provide support. The carotid arteries supply blood to the brain and can become blocked by cholesterol buildup (plaque). This buildup decreases blood flow to the brain. The stent remains in place to keep the carotid artery open.  LET [facility] CARE PROVIDER KNOW ABOUT:  Any allergies you have.  All medicines you are taking, including vitamins, herbs, eye drops, creams, and over-the-counter medicines.  Previous problems you or members of your family have had with the use of anesthetics.  Any blood disorders you have.  Previous surgeries you have had.  Medical conditions you have had, such as heart, kidney, or respiratory conditions, and diabetes.  Possibility of pregnancy, if this applies. RISKS AND COMPLICATIONS Generally, this is a safe procedure. However, as with any procedure, problems can occur. Possible problems include:  Stroke.  Infection.  Bleeding at the incision site.  A reaction to the contrast dye, such as rash, hives, or difficulty breathing.  The stented carotid artery can become blocked again.  If you take metformin, you may have to temporarily stop this medicine if you are undergoing a procedure that uses an iodine-based contrast dye. Metformin is cleared from the body by the kidneys. Contrast dye can temporarily affect kidney function. In rare cases, it can permanently affect kidney function. Be sure to talk to your health care provider before this procedure if you take metformin. BEFORE THE PROCEDURE  Ask your health care provider about changing or stopping your regular medicines. This is especially important if you are taking diabetes medicines or blood thinners.  Blood work  will be done before your procedure.  Do not eat or drink anything after midnight on the night before the surgery or as told by your health care provider. Ask your health care provider if it is okay to take any needed medicines with a sip of water.  Do not smoke if you smoke. Smoking will increase the chance of a healing problem after surgery.  If you are allowed to go home after the procedure, plan to have someone take you home and stay with you for 24 hours. Do not  drive yourself home. PROCEDURE  Carotid angiography with stent placement is an X-ray imaging procedure done in a catheterization laboratory.   A numbing medicine (local anesthetic) will be used to numb the groin or arm area where a thin, flexible hollow tube (catheter) will be inserted.  A small incision will be made in the groin or arm where the numbing medicine is given. The catheter will be put into an artery where the incision is made. The catheter will then be guided to the carotid arteries with the help of an X-ray machine (fluoroscope).  When the catheter is in the carotid artery, contrast dye will be injected through the catheter. The dye allows the inside of the carotid artery to show up on X-ray.  Once the narrowed portion of the artery is located, the stent can be placed. The stent will be placed beyond the area of narrowing in the carotid artery using a guidewire with a filter. The filter, also known as a distal protection device, is used to capture plaque that is released during the procedure. It helps to prevent plaque  from going to the brain and causing a stroke. A small balloon will be inflated for a few seconds to dilate the artery. The stent will then be placed in the artery. A second balloon inflation will be done to make sure the stent has completely expanded in the carotid artery. The stent will stay in place permanently. After several weeks, the tissue within the artery will heal and grow around the stent.  After  the stent is in place, the catheter will be removed. Bleeding from the incision site will be controlled by direct pressure. This can be either by a technician holding manual pressure to the area with his or her hand or by a closure device tool. AFTER THE PROCEDURE  You will be on bed rest for a few hours after surgery. You will need to remain flat during this time. The arm or leg that was used for the incision site will need to remain still. Do not bend, flex, or move the arm or leg because this can cause bleeding at the incision site.  The incision site will be watched and checked frequently for bleeding or swelling.  You may need stay in the hospital overnight for observation.  If there are no problems after the procedure, you may be allowed to go home the same day. Someone needs to take you home and stay with you for 24 hours after the procedure.  You will be placed on antiplatelet medicine after this procedure. Be sure you understand the antiplatelet dose and how long you will need to take this medicine.  Do not stop taking this medicine without talking to your health care provider. Suddenly stopping antiplatelet medicine puts you at risk for developing a clot.   This information is not intended to replace advice given to you by your health care provider. Make sure you discuss any questions you have with your health care provider.   Document Released: 09/26/2004 Document Revised: 06/05/2014 Document Reviewed: 10/06/2011 Elsevier Interactive Patient Education [employer]2016 [employer]

## 2015-03-19 NOTE — Progress Notes (Addendum)
Pt received discharge orders to assisted living. Pt is from home. Dr. Thedore MinsSingh notified and orders were received for Primary RN to change orders to Discharge Home.

## 2015-03-19 NOTE — Consult Note (Signed)
[facility] Vascular Consult Note  MRN : [id number]  Sally Curry is a 74 y.o. (11-19-1940) female who presents with chief complaint of  Chief Complaint  Patient presents with  . Chest Pain  .  History of Present Illness: Patient is a 74 year old female who was admitted with atypical chest pain. She has a history of coronary disease and reports she has had stents placed in the past. She has a history of carotid artery disease and is status post a right carotid endarterectomy in 2006 she has a known left carotid occlusion. She ruled out for myocardial infarction. She had abdominal pain which was evaluated. In speaking with her she denies recent episodes of amaurosis fugax, focal motor deficits or TIA like symptoms or new documented CVA. There is a past history of multiple strokes from 2002- 2006 per discussion with the daughter  Current Facility-Administered Medications  Medication Dose Route Frequency Provider Last Rate Last Dose  . acetaminophen (TYLENOL) tablet 650 mg  650 mg Oral Q4H PRN Gale Journey, MD   650 mg at 03/19/15 1025  . allopurinol (ZYLOPRIM) tablet 100 mg  100 mg Oral Daily Gale Journey, MD   100 mg at 03/19/15 0911  . clopidogrel (PLAVIX) tablet 75 mg  75 mg Oral Daily Gale Journey, MD   75 mg at 03/19/15 0911  . heparin injection 5,000 Units  5,000 Units Subcutaneous 3 times per day Gale Journey, MD   5,000 Units at 03/19/15 1553  . isosorbide mononitrate (IMDUR) 24 hr tablet 60 mg  60 mg Oral Daily Gale Journey, MD   60 mg at 03/19/15 0911  . metoprolol succinate (TOPROL-XL) 24 hr tablet 50 mg  50 mg Oral Daily Gale Journey, MD   50 mg at 03/19/15 0911  . ondansetron (ZOFRAN) injection 4 mg  4 mg Intravenous Q6H PRN Gale Journey, MD      . pantoprazole (PROTONIX) EC tablet 40 mg  40 mg Oral BID Gale Journey, MD   40 mg at 03/19/15 1020  . PARoxetine (PAXIL) tablet 30 mg  30 mg Oral Daily Gale Journey, MD    30 mg at 03/19/15 0911  . potassium chloride SA (K-DUR,KLOR-CON) CR tablet 20 mEq  20 mEq Oral Daily Gale Journey, MD   20 mEq at 03/19/15 0911  . pravastatin (PRAVACHOL) tablet 40 mg  40 mg Oral q1800 Gale Journey, MD   40 mg at 03/18/15 1726   Current Outpatient Prescriptions  Medication Sig Dispense Refill  . allopurinol (ZYLOPRIM) 100 MG tablet Take 100 mg by mouth daily.    . clopidogrel (PLAVIX) 75 MG tablet Take 75 mg by mouth daily.    Marland Kitchen gemfibrozil (LOPID) 600 MG tablet Take 600 mg by mouth 2 (two) times daily before a meal.    . isosorbide mononitrate (IMDUR) 60 MG 24 hr tablet Take 60 mg by mouth daily.    Marland Kitchen lovastatin (MEVACOR) 40 MG tablet Take 40 mg by mouth at bedtime.    . metoprolol succinate (TOPROL-XL) 50 MG 24 hr tablet Take 50 mg by mouth daily. Take with or immediately following a meal.    . Multiple Vitamin tablet Take 1 tablet by mouth daily.    . pantoprazole (PROTONIX) 40 MG tablet Take 40 mg by mouth 2 (two) times daily.     Marland Kitchen PARoxetine (PAXIL) 30 MG tablet Take 40 mg by mouth at bedtime.     Marland Kitchen  potassium chloride SA (K-DUR,KLOR-CON) 20 MEQ tablet Take 20 mEq by mouth 2 (two) times daily.     Marland Kitchen. amLODipine (NORVASC) 2.5 MG tablet Take 1 tablet (2.5 mg total) by mouth daily. 30 tablet 3    Past Medical History  Diagnosis Date  . CAD (coronary artery disease)   . Hypertension   . Stroke (HCC)   . Gout   . PVD (peripheral vascular disease) (HCC)   . Anemia   . Chronic kidney disease   . Anxiety   . CHF (congestive heart failure) (HCC)   . Depression   . Glaucoma     Past Surgical History  Procedure Laterality Date  . Coronary angioplasty with stent placement    . Pacemaker insertion    . Cholecystectomy    . Carotid endarterectomy    . Percutaneous placement intravascular stent cervical carotid artery    . Appendectomy      Social History Social History  Substance Use Topics  . Smoking status: Never Smoker   . Smokeless tobacco: None   . Alcohol Use: No    Family History Family History  Problem Relation Age of Onset  . Hypertension Mother   . Parkinson's disease Mother   . Emphysema Father   . Diabetes Sister    no family history of porphyria or autoimmune disease or bleeding clotting disorders  No Known Allergies   REVIEW OF SYSTEMS (Negative unless checked)  Constitutional: [] Weight loss  [] Fever  [] Chills Cardiac: [x] Chest pain   [] Chest pressure   [] Palpitations   [] Shortness of breath when laying flat   [] Shortness of breath at rest   [x] Shortness of breath with exertion. Vascular:  [x] Pain in legs with walking   [] Pain in legs at rest   [] Pain in legs when laying flat   [] Claudication   [] Pain in feet when walking  [] Pain in feet at rest  [] Pain in feet when laying flat   [] History of DVT   [] Phlebitis   [x] Swelling in legs   [] Varicose veins   [] Non-healing ulcers Pulmonary:   [] Uses home oxygen   [] Productive cough   [] Hemoptysis   [] Wheeze  [] COPD   [] Asthma Neurologic:  [] Dizziness  [] Blackouts   [] Seizures   [] History of stroke   [] History of TIA  [] Aphasia   [] Temporary blindness   [] Dysphagia   [x] Weakness or numbness in arms   [x] Weakness or numbness in legs Musculoskeletal:  [] Arthritis   [] Joint swelling   [] Joint pain   [] Low back pain Hematologic:  [] Easy bruising  [] Easy bleeding   [] Hypercoagulable state   [] Anemic  [] Hepatitis Gastrointestinal:  [] Blood in stool   [] Vomiting blood  [] Gastroesophageal reflux/heartburn   [] Difficulty swallowing. Genitourinary:  [] Chronic kidney disease   [] Difficult urination  [] Frequent urination  [] Burning with urination   [] Blood in urine Skin:  [] Rashes   [] Ulcers   [] Wounds Psychological:  [] History of anxiety   []  History of major depression.  Physical Examination  Filed Vitals:   03/18/15 2030 03/19/15 0446 03/19/15 1322 03/19/15 1333  BP: 137/47 165/57 173/47 146/47  Pulse: 62 57 56   Temp: 98.6 F (37 C) 97.6 F (36.4 C) 98.7 F (37.1 C)    TempSrc: Oral Oral    Resp: 18 16 18    Height:      Weight:      SpO2: 90% 93% 95%    Body mass index is 36.86 kg/(m^2).  Head: [place]/AT, No temporalis wasting. Prominent temp pulse not noted. Ear/Nose/Throat: Nares w/o  erythema or drainage, oropharynx w/o obsrtuction,  Dentition poor.  Eyes: PERRLA, Sclera nonicteric.  Neck: Supple, no nuchal rigidity.  No bruit or JVD.  Pulmonary:  Breath sounds equal bilaterally, no use of accessory muscles.  Cardiac: RRR, normal S1, S2, no Murmurs, rubs or gallops. Vascular: Right CEA scar well-healed bilateral carotid bruits noted pedal pulses not palpable 2+ radial pulses bilaterally Gastrointestinal: soft, non-tender, non-distended.  Musculoskeletal: Moves all extremities.  No deformity or atrophy. No edema. Neurologic: CN 2-12 intact. Symmetrical.  Speech is fluent.  Psychiatric: Judgment intact, Mood & affect appropriate for pt's clinical situation. Dermatologic: No rashes or ulcers noted.  No cellulitis or open wounds. Lymph : No Cervical,  or Inguinal lymphadenopathy.      CBC Lab Results  Component Value Date   WBC 8.9 03/19/2015   HGB 11.0* 03/19/2015   HCT 32.9* 03/19/2015   MCV 96.1 03/19/2015   PLT 183 03/19/2015    BMET    Component Value Date/Time   NA 142 03/19/2015 0429   NA 142 01/19/2014 0605   K 4.1 03/19/2015 0429   K 4.0 01/19/2014 0605   CL 114* 03/19/2015 0429   CL 114* 01/19/2014 0605   CO2 23 03/19/2015 0429   CO2 18* 01/19/2014 0605   GLUCOSE 116* 03/19/2015 0429   GLUCOSE 215* 01/19/2014 0605   BUN 49* 03/19/2015 0429   BUN 39* 01/19/2014 0605   CREATININE 1.40* 03/19/2015 0429   CREATININE 1.37* 01/19/2014 0605   CALCIUM 9.1 03/19/2015 0429   CALCIUM 8.8 01/19/2014 0605   GFRNONAA 37* 03/19/2015 0429   GFRNONAA 39* 01/19/2014 0605   GFRAA 42* 03/19/2015 0429   GFRAA 45* 01/19/2014 0605   Estimated Creatinine Clearance: 36.8 mL/min (by C-G formula based on Cr of 1.4).  COAG Lab Results   Component Value Date   INR 1.2 01/15/2014    Radiology Duplex ultrasound of the carotid arteries obtained this admission demonstrates a 50-69% right ICA stenosis with peak systolic velocities of 171. Left side is occluded    Assessment/Plan #1 asymptomatic atherosclerotic occlusive disease of the carotid arteries. Patient has known occlusion of the left and is status post endarterectomy on the right. By duplex ultrasound she has a moderate stenosis of the right. There are no recent symptoms and discussion with both the patient and the daughter no evidence to support any TIA or neurologic changes. No further surgical intervention at this time. I strongly recommend that the patient begin biannual follow-up and this was stressed. I also discussed medical management noting that whatever medical therapy is appropriate for her cardiac status is also exactly what she needs for her carotids. She will follow with me or Dr. Wyn Quaker in the office for routine surveillance  #2 chest pain with history of coronary artery disease status post MI: She has ruled out for acute MI at this point. Dr. Lady Gary is now following.  Plan for further cardiac workup and treatment per Dr. Colette Ribas Plavix and statin.  #3 depression: The daughter states that the patient is chronically depressed but this has not changed recently. Continue Paxil. If no improvement and other medical conditions ruled out would consider psychiatric consultation.  #4 hypertension: Changes in her antihypertensives per medical team.  #5 CK D3: Stable. No changes  #6 peptic ulcer disease: Continue Protonix   Adda Stokes, Latina Craver, MD  03/19/2015 5:17 PM

## 2015-03-19 NOTE — Progress Notes (Signed)
PT Cancellation Note  Patient Details Name: Sally Curry MRN: [id number] DOB: 06-20-1940   Cancelled Treatment:    Reason Eval/Treat Not Completed: Other (comment) (Treatment session attempted x2 this date.  Initial attempt, patient eating breakfast; second attempt, patient declined stating she needed to have bath with CNA first.  CNA informed/aware.  Will re-attempt as able.)   Rhoda Waldvogel H. Manson PasseyBrown, PT, DPT, NCS 03/19/2015, 9:47 AM [phone]

## 2015-06-03 ENCOUNTER — Emergency Department (HOSPITAL_COMMUNITY): Payer: Medicare Other

## 2015-06-03 ENCOUNTER — Encounter (HOSPITAL_COMMUNITY): Payer: Self-pay | Admitting: Emergency Medicine

## 2015-06-03 ENCOUNTER — Inpatient Hospital Stay (HOSPITAL_COMMUNITY)
Admission: EM | Admit: 2015-06-03 | Discharge: 2015-06-07 | DRG: 304 | Disposition: A | Discharge status: Home Health | Payer: Medicare Other | Attending: Internal Medicine | Admitting: Internal Medicine

## 2015-06-03 DIAGNOSIS — Z7902 Long term (current) use of antithrombotics/antiplatelets: Secondary | ICD-10-CM

## 2015-06-03 DIAGNOSIS — Z95820 Peripheral vascular angioplasty status with implants and grafts: Secondary | ICD-10-CM

## 2015-06-03 DIAGNOSIS — Z950 Presence of cardiac pacemaker: Secondary | ICD-10-CM

## 2015-06-03 DIAGNOSIS — R109 Unspecified abdominal pain: Secondary | ICD-10-CM | POA: Diagnosis present

## 2015-06-03 DIAGNOSIS — D631 Anemia in chronic kidney disease: Secondary | ICD-10-CM | POA: Diagnosis present

## 2015-06-03 DIAGNOSIS — M109 Gout, unspecified: Secondary | ICD-10-CM | POA: Diagnosis present

## 2015-06-03 DIAGNOSIS — R0600 Dyspnea, unspecified: Secondary | ICD-10-CM | POA: Diagnosis not present

## 2015-06-03 DIAGNOSIS — Z8673 Personal history of transient ischemic attack (TIA), and cerebral infarction without residual deficits: Secondary | ICD-10-CM

## 2015-06-03 DIAGNOSIS — F418 Other specified anxiety disorders: Secondary | ICD-10-CM | POA: Diagnosis present

## 2015-06-03 DIAGNOSIS — F329 Major depressive disorder, single episode, unspecified: Secondary | ICD-10-CM | POA: Diagnosis present

## 2015-06-03 DIAGNOSIS — I509 Heart failure, unspecified: Secondary | ICD-10-CM

## 2015-06-03 DIAGNOSIS — I5033 Acute on chronic diastolic (congestive) heart failure: Secondary | ICD-10-CM | POA: Diagnosis present

## 2015-06-03 DIAGNOSIS — J9601 Acute respiratory failure with hypoxia: Secondary | ICD-10-CM | POA: Diagnosis present

## 2015-06-03 DIAGNOSIS — I739 Peripheral vascular disease, unspecified: Secondary | ICD-10-CM | POA: Diagnosis present

## 2015-06-03 DIAGNOSIS — D649 Anemia, unspecified: Secondary | ICD-10-CM | POA: Diagnosis present

## 2015-06-03 DIAGNOSIS — I10 Essential (primary) hypertension: Secondary | ICD-10-CM | POA: Diagnosis present

## 2015-06-03 DIAGNOSIS — N10 Acute tubulo-interstitial nephritis: Secondary | ICD-10-CM | POA: Diagnosis present

## 2015-06-03 DIAGNOSIS — I1310 Hypertensive heart and chronic kidney disease without heart failure, with stage 1 through stage 4 chronic kidney disease, or unspecified chronic kidney disease: Secondary | ICD-10-CM | POA: Diagnosis not present

## 2015-06-03 DIAGNOSIS — I2510 Atherosclerotic heart disease of native coronary artery without angina pectoris: Secondary | ICD-10-CM | POA: Diagnosis present

## 2015-06-03 DIAGNOSIS — Z955 Presence of coronary angioplasty implant and graft: Secondary | ICD-10-CM

## 2015-06-03 DIAGNOSIS — N179 Acute kidney failure, unspecified: Secondary | ICD-10-CM | POA: Diagnosis not present

## 2015-06-03 DIAGNOSIS — T502X5A Adverse effect of carbonic-anhydrase inhibitors, benzothiadiazides and other diuretics, initial encounter: Secondary | ICD-10-CM | POA: Diagnosis not present

## 2015-06-03 DIAGNOSIS — N390 Urinary tract infection, site not specified: Secondary | ICD-10-CM | POA: Diagnosis present

## 2015-06-03 DIAGNOSIS — Z79899 Other long term (current) drug therapy: Secondary | ICD-10-CM

## 2015-06-03 DIAGNOSIS — N183 Chronic kidney disease, stage 3 (moderate): Secondary | ICD-10-CM | POA: Diagnosis present

## 2015-06-03 HISTORY — DX: Presence of cardiac pacemaker: Z95.0

## 2015-06-03 LAB — CBC WITH DIFFERENTIAL/PLATELET
BASOS ABS: 0 10*3/uL (ref 0.0–0.1)
Basophils Relative: 0 %
EOS PCT: 4 %
Eosinophils Absolute: 0.3 10*3/uL (ref 0.0–0.7)
HCT: 33.7 % — ABNORMAL LOW (ref 36.0–46.0)
HEMOGLOBIN: 10.5 g/dL — AB (ref 12.0–15.0)
LYMPHS PCT: 13 %
Lymphs Abs: 1.1 10*3/uL (ref 0.7–4.0)
MCH: 30.4 pg (ref 26.0–34.0)
MCHC: 31.2 g/dL (ref 30.0–36.0)
MCV: 97.7 fL (ref 78.0–100.0)
Monocytes Absolute: 0.9 10*3/uL (ref 0.1–1.0)
Monocytes Relative: 11 %
NEUTROS PCT: 72 %
Neutro Abs: 5.8 10*3/uL (ref 1.7–7.7)
PLATELETS: 200 10*3/uL (ref 150–400)
RBC: 3.45 MIL/uL — AB (ref 3.87–5.11)
RDW: 14.4 % (ref 11.5–15.5)
WBC: 8.1 10*3/uL (ref 4.0–10.5)

## 2015-06-03 LAB — I-STAT CHEM 8, ED
BUN: 32 mg/dL — AB (ref 6–20)
CALCIUM ION: 1.26 mmol/L (ref 1.13–1.30)
CHLORIDE: 114 mmol/L — AB (ref 101–111)
CREATININE: 1.3 mg/dL — AB (ref 0.44–1.00)
GLUCOSE: 122 mg/dL — AB (ref 65–99)
HCT: 34 % — ABNORMAL LOW (ref 36.0–46.0)
Hemoglobin: 11.6 g/dL — ABNORMAL LOW (ref 12.0–15.0)
POTASSIUM: 4.4 mmol/L (ref 3.5–5.1)
Sodium: 143 mmol/L (ref 135–145)
TCO2: 19 mmol/L (ref 0–100)

## 2015-06-03 LAB — URINE MICROSCOPIC-ADD ON: RBC / HPF: NONE SEEN RBC/hpf (ref 0–5)

## 2015-06-03 LAB — URINALYSIS, ROUTINE W REFLEX MICROSCOPIC
Bilirubin Urine: NEGATIVE
Glucose, UA: NEGATIVE mg/dL
HGB URINE DIPSTICK: NEGATIVE
Ketones, ur: NEGATIVE mg/dL
NITRITE: NEGATIVE
PROTEIN: 100 mg/dL — AB
Specific Gravity, Urine: 1.017 (ref 1.005–1.030)
pH: 5.5 (ref 5.0–8.0)

## 2015-06-03 LAB — D-DIMER, QUANTITATIVE: D-Dimer, Quant: 1.39 ug/mL-FEU — ABNORMAL HIGH (ref 0.00–0.50)

## 2015-06-03 LAB — I-STAT TROPONIN, ED: TROPONIN I, POC: 0.03 ng/mL (ref 0.00–0.08)

## 2015-06-03 LAB — I-STAT CG4 LACTIC ACID, ED: LACTIC ACID, VENOUS: 1.01 mmol/L (ref 0.5–2.0)

## 2015-06-03 NOTE — ED Provider Notes (Signed)
Patient seen/examined in the Emergency Department in conjunction with Midlevel Provider  Patient reports upper back pain with deep breathing Exam : awake/alert, but she appears uncomfortable, she is tachypneic and ill appearing.  She is hypoxic Plan: workup initiated. She reports h/o PE, will need extensive evaluation  ED ECG REPORT   Date: 06/03/2015 2227  Rate: 83  Rhythm: normal sinus rhythm  QRS Axis: normal  Intervals: normal  ST/T Wave abnormalities: t wave inversion  Conduction Disutrbances:none   I have personally reviewed the EKG tracing and agree with the computerized printout as noted.    Zadie Rhineonald Hayla Hinger, MD 06/03/15 [phone]

## 2015-06-03 NOTE — ED Notes (Signed)
Placed on  bedpan

## 2015-06-03 NOTE — ED Notes (Signed)
Pt appears short of breath. PT does not normally wear O2, Pt O2 89% on room air. Placed on nasal cannula.

## 2015-06-03 NOTE — ED Provider Notes (Signed)
CSN: [id number]     Arrival date & time 06/03/15  2200 History   First MD Initiated Contact with Patient 06/03/15 2213     Chief Complaint  Patient presents with  . Flank Pain     (Consider location/radiation/quality/duration/timing/severity/associated sxs/prior Treatment) HPI Comments: This is 75 year old female with a history of hypertension, hyperlipidemia , CHF , chronic kidney disease , PVD , CAD, carotid endarterectomy, cholecystectomy, angioplasty with stents stenting of the cervical carotids an appendectomy who presents with 2 days of constant back pain.  She is unable to explain exactly where the pain originates.  She also states that she was recently treated for a UTI with antibiotics  Patient is a 75 y.o. female presenting with flank pain. The history is provided by the patient.  Flank Pain This is a new problem. The current episode started yesterday. The problem occurs constantly. Pertinent negatives include no chest pain, chills, fever or vomiting. Nothing aggravates the symptoms. She has tried nothing for the symptoms. The treatment provided no relief.    Past Medical History  Diagnosis Date  . CAD (coronary artery disease)   . Hypertension   . Stroke (HCC)   . Gout   . PVD (peripheral vascular disease) (HCC)   . Anemia   . Chronic kidney disease   . Anxiety   . CHF (congestive heart failure) (HCC)   . Depression   . Glaucoma    Past Surgical History  Procedure Laterality Date  . Coronary angioplasty with stent placement    . Pacemaker insertion    . Cholecystectomy    . Carotid endarterectomy    . Percutaneous placement intravascular stent cervical carotid artery    . Appendectomy     Family History  Problem Relation Age of Onset  . Hypertension Mother   . Parkinson's disease Mother   . Emphysema Father   . Diabetes Sister    Social History  Substance Use Topics  . Smoking status: Never Smoker   . Smokeless tobacco: None  . Alcohol Use: No   OB  History    No data available     Review of Systems  Constitutional: Negative for fever and chills.  Respiratory: Positive for shortness of breath.   Cardiovascular: Negative for chest pain and leg swelling.  Gastrointestinal: Negative for vomiting.  Genitourinary: Positive for flank pain. Negative for dysuria.  Musculoskeletal: Positive for back pain.  All other systems reviewed and are negative.     Allergies  Review of patient's allergies indicates no known allergies.  Home Medications   Prior to Admission medications   Medication Sig Start Date End Date Taking? Authorizing Provider  allopurinol (ZYLOPRIM) 100 MG tablet Take 100 mg by mouth daily.    Historical Provider, MD  amLODipine (NORVASC) 2.5 MG tablet Take 1 tablet (2.5 mg total) by mouth daily. 03/19/15   Leotis Shames, MD  clopidogrel (PLAVIX) 75 MG tablet Take 75 mg by mouth daily.    Historical Provider, MD  gemfibrozil (LOPID) 600 MG tablet Take 600 mg by mouth 2 (two) times daily before a meal.    Historical Provider, MD  isosorbide mononitrate (IMDUR) 60 MG 24 hr tablet Take 60 mg by mouth daily.    Historical Provider, MD  lovastatin (MEVACOR) 40 MG tablet Take 40 mg by mouth at bedtime.    Historical Provider, MD  metoprolol succinate (TOPROL-XL) 50 MG 24 hr tablet Take 50 mg by mouth daily. Take with or immediately following a meal.  Historical Provider, MD  Multiple Vitamin tablet Take 1 tablet by mouth daily.    Historical Provider, MD  pantoprazole (PROTONIX) 40 MG tablet Take 40 mg by mouth 2 (two) times daily.     Historical Provider, MD  PARoxetine (PAXIL) 30 MG tablet Take 40 mg by mouth at bedtime.     Historical Provider, MD  potassium chloride SA (K-DUR,KLOR-CON) 20 MEQ tablet Take 20 mEq by mouth 2 (two) times daily.     Historical Provider, MD   BP 166/52 mmHg  Pulse 74  Temp(Src) 98.3 F (36.8 C) (Oral)  Resp 23  SpO2 92% Physical Exam  Constitutional: She appears well-developed and  well-nourished. She appears distressed.  HENT:  Head: Normocephalic.  Eyes: Pupils are equal, round, and reactive to light.  Neck: Normal range of motion.  Cardiovascular: Normal rate and regular rhythm.   Pulmonary/Chest: Effort normal.  Abdominal: Soft.  Musculoskeletal: Normal range of motion.  Neurological: She is alert.  Skin: Skin is warm. She is not diaphoretic.  Nursing note and vitals reviewed.   ED Course  Procedures (including critical care time) Labs Review Labs Reviewed  CBC WITH DIFFERENTIAL/PLATELET - Abnormal; Notable for the following:    RBC 3.45 (*)    Hemoglobin 10.5 (*)    HCT 33.7 (*)    All other components within normal limits  D-DIMER, QUANTITATIVE (NOT AT [facility]) - Abnormal; Notable for the following:    D-Dimer, Quant 1.39 (*)    All other components within normal limits  URINALYSIS, ROUTINE W REFLEX MICROSCOPIC (NOT AT [facility]) - Abnormal; Notable for the following:    Protein, ur 100 (*)    Leukocytes, UA MODERATE (*)    All other components within normal limits  URINE MICROSCOPIC-ADD ON - Abnormal; Notable for the following:    Squamous Epithelial / LPF 0-5 (*)    Bacteria, UA RARE (*)    Casts HYALINE CASTS (*)    All other components within normal limits  BRAIN NATRIURETIC PEPTIDE - Abnormal; Notable for the following:    B Natriuretic Peptide 315.0 (*)    All other components within normal limits  I-STAT CHEM 8, ED - Abnormal; Notable for the following:    Chloride 114 (*)    BUN 32 (*)    Creatinine, Ser 1.30 (*)    Glucose, Bld 122 (*)    Hemoglobin 11.6 (*)    HCT 34.0 (*)    All other components within normal limits  I-STAT TROPOININ, ED  I-STAT TROPOININ, ED  I-STAT CG4 LACTIC ACID, ED  I-STAT CG4 LACTIC ACID, ED    Imaging Review Ct Angio Chest Pe W/cm &/or Wo Cm  06/04/2015  CLINICAL DATA:  Acute onset of generalized chest pain and shortness of breath. Elevated D-dimer. Initial encounter. EXAM: CT ANGIOGRAPHY CHEST WITH CONTRAST  TECHNIQUE: Multidetector CT imaging of the chest was performed using the standard protocol during bolus administration of intravenous contrast. Multiplanar CT image reconstructions and MIPs were obtained to evaluate the vascular anatomy. CONTRAST:  55mL OMNIPAQUE IOHEXOL 350 MG/ML SOLN COMPARISON:  Chest radiograph performed 06/03/2015 FINDINGS: There is no evidence of pulmonary embolus. Mild interstitial prominence is noted, with hazy bibasilar opacities, raising concern for mild interstitial edema. Trace bilateral pleural fluid is noted. There is no evidence of pneumothorax. No masses are identified; no abnormal focal contrast enhancement is seen. Diffuse coronary artery calcifications are seen. Pacemaker leads are noted ending at the right atrium and right ventricle. A left-sided pacemaker is noted.  The thoracic aorta is borderline normal in caliber. No mediastinal lymphadenopathy is seen. No pericardial effusion is identified. No axillary lymphadenopathy is seen. The visualized portions of the thyroid gland are unremarkable in appearance. The visualized portions of the liver and spleen are unremarkable. There is reflux of contrast into the hepatic veins and IVC. No acute osseous abnormalities are seen. Review of the MIP images confirms the above findings. IMPRESSION: 1. No evidence of pulmonary embolus. 2. Mild interstitial prominence, with hazy bibasilar opacities, concerning for mild interstitial edema. Trace bilateral pleural fluid noted. 3. Diffuse coronary artery calcifications seen. 4. Reflux of contrast into the hepatic veins and IVC. Electronically Signed   By: Roanna Raider M.D.   On: 06/04/2015 01:47   Dg Chest Portable 1 View  06/03/2015  CLINICAL DATA:  Low back pain since yesterday, questionable fever, cough for 2 weeks, hypertension, diabetes mellitus, history MI and stroke EXAM: PORTABLE CHEST 1 VIEW COMPARISON:  Portable exam 2247 hours compared to 03/16/2015 FINDINGS: LEFT subclavian  transvenous pacemaker leads project over RIGHT atrium and RIGHT ventricle, unchanged. Enlargement of cardiac silhouette with pulmonary vascular congestion. Calcified tortuous aorta. Minimal linear atelectasis versus scarring at lingula, stable. No definite acute failure or consolidation. No pleural effusion or pneumothorax. Bones demineralized. IMPRESSION: Enlargement of cardiac silhouette with pulmonary vascular congestion. Minimal chronic atelectasis versus scarring at lingula without definite infiltrate. Electronically Signed   By: Ulyses Southward M.D.   On: 06/03/2015 23:06   I have personally reviewed and evaluated these images and lab results as part of my medical decision-making.   EKG Interpretation None     chest x-ray showed that she had mild congestion.  CT scan was performed due to a positive d-dimer.  The patient's cardiac history as well as pain is reveals that she indeed does have slight congestion, no PE visible.  Patient has been given 20 mg of Lasix for supportive measures.  She will be admitted to the hospital for continued evaluation and monitoring  MDM   Final diagnoses:  Acute congestive heart failure, unspecified congestive heart failure type (HCC)         Earley Favor, NP 06/04/15 1610  Zadie Rhine, MD 06/04/15 1258

## 2015-06-03 NOTE — ED Notes (Signed)
Taken off bedpan and collected BNP

## 2015-06-03 NOTE — ED Notes (Addendum)
Per EMS- pt here from home c.o. Left flank pain for two days. Pt has hx of kidney stones and states the pain feels similar. Pt also states her urine is discolored. Denies nausea vomiting or diarrhea. Recently treated with antibiotics for UTI. No IV access. Pt CBG 300s. Pt BP hypertensive, has not taken her night time medications.  Pt has also had a dry non productive cough.

## 2015-06-04 ENCOUNTER — Emergency Department (HOSPITAL_COMMUNITY): Payer: Medicare Other

## 2015-06-04 ENCOUNTER — Inpatient Hospital Stay (HOSPITAL_COMMUNITY): Payer: Medicare Other

## 2015-06-04 ENCOUNTER — Other Ambulatory Visit: Payer: Self-pay

## 2015-06-04 ENCOUNTER — Encounter (HOSPITAL_COMMUNITY): Payer: Self-pay | Admitting: Family Medicine

## 2015-06-04 DIAGNOSIS — Z955 Presence of coronary angioplasty implant and graft: Secondary | ICD-10-CM | POA: Diagnosis not present

## 2015-06-04 DIAGNOSIS — I5031 Acute diastolic (congestive) heart failure: Secondary | ICD-10-CM

## 2015-06-04 DIAGNOSIS — R109 Unspecified abdominal pain: Secondary | ICD-10-CM | POA: Diagnosis present

## 2015-06-04 DIAGNOSIS — D631 Anemia in chronic kidney disease: Secondary | ICD-10-CM | POA: Diagnosis present

## 2015-06-04 DIAGNOSIS — Z7902 Long term (current) use of antithrombotics/antiplatelets: Secondary | ICD-10-CM | POA: Diagnosis not present

## 2015-06-04 DIAGNOSIS — N183 Chronic kidney disease, stage 3 (moderate): Secondary | ICD-10-CM | POA: Diagnosis present

## 2015-06-04 DIAGNOSIS — I1310 Hypertensive heart and chronic kidney disease without heart failure, with stage 1 through stage 4 chronic kidney disease, or unspecified chronic kidney disease: Secondary | ICD-10-CM | POA: Diagnosis present

## 2015-06-04 DIAGNOSIS — I509 Heart failure, unspecified: Secondary | ICD-10-CM | POA: Diagnosis not present

## 2015-06-04 DIAGNOSIS — I739 Peripheral vascular disease, unspecified: Secondary | ICD-10-CM | POA: Diagnosis present

## 2015-06-04 DIAGNOSIS — F329 Major depressive disorder, single episode, unspecified: Secondary | ICD-10-CM | POA: Diagnosis present

## 2015-06-04 DIAGNOSIS — N10 Acute tubulo-interstitial nephritis: Secondary | ICD-10-CM | POA: Diagnosis present

## 2015-06-04 DIAGNOSIS — T502X5A Adverse effect of carbonic-anhydrase inhibitors, benzothiadiazides and other diuretics, initial encounter: Secondary | ICD-10-CM | POA: Diagnosis not present

## 2015-06-04 DIAGNOSIS — F418 Other specified anxiety disorders: Secondary | ICD-10-CM

## 2015-06-04 DIAGNOSIS — Z950 Presence of cardiac pacemaker: Secondary | ICD-10-CM | POA: Diagnosis not present

## 2015-06-04 DIAGNOSIS — I5033 Acute on chronic diastolic (congestive) heart failure: Secondary | ICD-10-CM | POA: Diagnosis present

## 2015-06-04 DIAGNOSIS — D649 Anemia, unspecified: Secondary | ICD-10-CM | POA: Diagnosis present

## 2015-06-04 DIAGNOSIS — J9601 Acute respiratory failure with hypoxia: Secondary | ICD-10-CM | POA: Diagnosis present

## 2015-06-04 DIAGNOSIS — M109 Gout, unspecified: Secondary | ICD-10-CM | POA: Diagnosis present

## 2015-06-04 DIAGNOSIS — I2510 Atherosclerotic heart disease of native coronary artery without angina pectoris: Secondary | ICD-10-CM | POA: Diagnosis present

## 2015-06-04 DIAGNOSIS — R0600 Dyspnea, unspecified: Secondary | ICD-10-CM | POA: Diagnosis present

## 2015-06-04 DIAGNOSIS — Z79899 Other long term (current) drug therapy: Secondary | ICD-10-CM | POA: Diagnosis not present

## 2015-06-04 DIAGNOSIS — I10 Essential (primary) hypertension: Secondary | ICD-10-CM | POA: Diagnosis not present

## 2015-06-04 DIAGNOSIS — Z8673 Personal history of transient ischemic attack (TIA), and cerebral infarction without residual deficits: Secondary | ICD-10-CM | POA: Diagnosis not present

## 2015-06-04 DIAGNOSIS — Z95820 Peripheral vascular angioplasty status with implants and grafts: Secondary | ICD-10-CM | POA: Diagnosis not present

## 2015-06-04 DIAGNOSIS — N390 Urinary tract infection, site not specified: Secondary | ICD-10-CM | POA: Diagnosis present

## 2015-06-04 DIAGNOSIS — N179 Acute kidney failure, unspecified: Secondary | ICD-10-CM | POA: Diagnosis not present

## 2015-06-04 LAB — COMPREHENSIVE METABOLIC PANEL
ALBUMIN: 3.2 g/dL — AB (ref 3.5–5.0)
ALT: 11 U/L — ABNORMAL LOW (ref 14–54)
ANION GAP: 10 (ref 5–15)
AST: 17 U/L (ref 15–41)
Alkaline Phosphatase: 91 U/L (ref 38–126)
BILIRUBIN TOTAL: 0.7 mg/dL (ref 0.3–1.2)
BUN: 29 mg/dL — ABNORMAL HIGH (ref 6–20)
CO2: 19 mmol/L — ABNORMAL LOW (ref 22–32)
Calcium: 9.6 mg/dL (ref 8.9–10.3)
Chloride: 115 mmol/L — ABNORMAL HIGH (ref 101–111)
Creatinine, Ser: 1.31 mg/dL — ABNORMAL HIGH (ref 0.44–1.00)
GFR calc Af Amer: 46 mL/min — ABNORMAL LOW (ref >60)
GFR, EST NON AFRICAN AMERICAN: 39 mL/min — AB (ref >60)
Glucose, Bld: 129 mg/dL — ABNORMAL HIGH (ref 65–99)
POTASSIUM: 3.9 mmol/L (ref 3.5–5.1)
Sodium: 144 mmol/L (ref 135–145)
TOTAL PROTEIN: 5.9 g/dL — AB (ref 6.5–8.1)

## 2015-06-04 LAB — CBC
HEMATOCRIT: 31.3 % — AB (ref 36.0–46.0)
Hemoglobin: 9.9 g/dL — ABNORMAL LOW (ref 12.0–15.0)
MCH: 31 pg (ref 26.0–34.0)
MCHC: 31.6 g/dL (ref 30.0–36.0)
MCV: 98.1 fL (ref 78.0–100.0)
Platelets: 193 10*3/uL (ref 150–400)
RBC: 3.19 MIL/uL — ABNORMAL LOW (ref 3.87–5.11)
RDW: 14.4 % (ref 11.5–15.5)
WBC: 6.6 10*3/uL (ref 4.0–10.5)

## 2015-06-04 LAB — GLUCOSE, CAPILLARY
GLUCOSE-CAPILLARY: 117 mg/dL — AB (ref 65–99)
Glucose-Capillary: 119 mg/dL — ABNORMAL HIGH (ref 65–99)
Glucose-Capillary: 133 mg/dL — ABNORMAL HIGH (ref 65–99)
Glucose-Capillary: 91 mg/dL (ref 65–99)

## 2015-06-04 LAB — I-STAT CG4 LACTIC ACID, ED: LACTIC ACID, VENOUS: 0.56 mmol/L (ref 0.5–2.0)

## 2015-06-04 LAB — BRAIN NATRIURETIC PEPTIDE: B Natriuretic Peptide: 315 pg/mL — ABNORMAL HIGH (ref 0.0–100.0)

## 2015-06-04 LAB — TROPONIN I
TROPONIN I: 0.03 ng/mL (ref <0.031)
TROPONIN I: 0.03 ng/mL (ref <0.031)
Troponin I: 0.03 ng/mL (ref <0.031)

## 2015-06-04 MED ORDER — ISOSORBIDE MONONITRATE ER 60 MG PO TB24
60.0000 mg | ORAL_TABLET | Freq: Every day | ORAL | Status: DC
  Administered 2015-06-04 – 2015-06-07 (×4): 60 mg via ORAL
  Filled 2015-06-04 (×4): qty 1

## 2015-06-04 MED ORDER — HYDROMORPHONE HCL 1 MG/ML IJ SOLN: 0.5000 mg | INTRAMUSCULAR | Status: DC | PRN

## 2015-06-04 MED ORDER — FUROSEMIDE 10 MG/ML IJ SOLN
20.0000 mg | Freq: Once | INTRAMUSCULAR | Status: AC
  Administered 2015-06-04: 20 mg via INTRAVENOUS
  Filled 2015-06-04: qty 2

## 2015-06-04 MED ORDER — ACETAMINOPHEN 325 MG PO TABS: 650.0000 mg | ORAL_TABLET | Freq: Four times a day (QID) | ORAL | Status: DC | PRN

## 2015-06-04 MED ORDER — PRAVASTATIN SODIUM 40 MG PO TABS
40.0000 mg | ORAL_TABLET | Freq: Every day | ORAL | Status: DC
  Administered 2015-06-04 – 2015-06-06 (×3): 40 mg via ORAL
  Filled 2015-06-04 (×3): qty 1

## 2015-06-04 MED ORDER — CLOPIDOGREL BISULFATE 75 MG PO TABS
75.0000 mg | ORAL_TABLET | Freq: Every day | ORAL | Status: DC
Start: 2015-06-04 — End: 2015-06-07
  Administered 2015-06-04 – 2015-06-07 (×4): 75 mg via ORAL
  Filled 2015-06-04 (×4): qty 1

## 2015-06-04 MED ORDER — PAROXETINE HCL 20 MG PO TABS
40.0000 mg | ORAL_TABLET | Freq: Every day | ORAL | Status: DC
  Administered 2015-06-05 – 2015-06-06 (×3): 40 mg via ORAL
  Filled 2015-06-04 (×3): qty 2

## 2015-06-04 MED ORDER — IOHEXOL 350 MG/ML SOLN
100.0000 mL | Freq: Once | INTRAVENOUS | Status: AC | PRN
  Administered 2015-06-04: 55 mL via INTRAVENOUS

## 2015-06-04 MED ORDER — AMLODIPINE BESYLATE 2.5 MG PO TABS
2.5000 mg | ORAL_TABLET | Freq: Every day | ORAL | Status: DC
  Administered 2015-06-04 – 2015-06-07 (×4): 2.5 mg via ORAL
  Filled 2015-06-04 (×4): qty 1

## 2015-06-04 MED ORDER — PANTOPRAZOLE SODIUM 40 MG PO TBEC
40.0000 mg | DELAYED_RELEASE_TABLET | Freq: Two times a day (BID) | ORAL | Status: DC
  Administered 2015-06-04 – 2015-06-07 (×7): 40 mg via ORAL
  Filled 2015-06-04 (×8): qty 1

## 2015-06-04 MED ORDER — FUROSEMIDE 10 MG/ML IJ SOLN
20.0000 mg | Freq: Every day | INTRAMUSCULAR | Status: DC
  Administered 2015-06-04 – 2015-06-06 (×3): 20 mg via INTRAVENOUS
  Filled 2015-06-04 (×3): qty 2

## 2015-06-04 MED ORDER — GEMFIBROZIL 600 MG PO TABS
600.0000 mg | ORAL_TABLET | Freq: Two times a day (BID) | ORAL | Status: DC
  Administered 2015-06-04 – 2015-06-07 (×7): 600 mg via ORAL
  Filled 2015-06-04 (×9): qty 1

## 2015-06-04 MED ORDER — DEXTROSE 5 % IV SOLN
1.0000 g | Freq: Every day | INTRAVENOUS | Status: DC
  Administered 2015-06-04: 1 g via INTRAVENOUS
  Filled 2015-06-04: qty 10

## 2015-06-04 MED ORDER — ONDANSETRON HCL 4 MG PO TABS: 4.0000 mg | ORAL_TABLET | Freq: Four times a day (QID) | ORAL | Status: DC | PRN

## 2015-06-04 MED ORDER — CEFAZOLIN SODIUM-DEXTROSE 2-3 GM-% IV SOLR
2.0000 g | Freq: Two times a day (BID) | INTRAVENOUS | Status: DC
  Filled 2015-06-04: qty 50

## 2015-06-04 MED ORDER — OXYCODONE HCL 5 MG PO TABS: 5.0000 mg | ORAL_TABLET | ORAL | Status: DC | PRN

## 2015-06-04 MED ORDER — POTASSIUM CHLORIDE CRYS ER 20 MEQ PO TBCR
20.0000 meq | EXTENDED_RELEASE_TABLET | Freq: Two times a day (BID) | ORAL | Status: DC
  Administered 2015-06-04 – 2015-06-06 (×5): 20 meq via ORAL
  Filled 2015-06-04 (×5): qty 1

## 2015-06-04 MED ORDER — HYDROMORPHONE HCL 1 MG/ML IJ SOLN
0.5000 mg | Freq: Once | INTRAMUSCULAR | Status: AC
  Administered 2015-06-04: 0.5 mg via INTRAVENOUS
  Filled 2015-06-04: qty 1

## 2015-06-04 MED ORDER — ACETAMINOPHEN 650 MG RE SUPP: 650.0000 mg | Freq: Four times a day (QID) | RECTAL | Status: DC | PRN

## 2015-06-04 MED ORDER — ONDANSETRON HCL 4 MG/2ML IJ SOLN
4.0000 mg | Freq: Four times a day (QID) | INTRAMUSCULAR | Status: DC | PRN
Start: 2015-06-04 — End: 2015-06-07

## 2015-06-04 MED ORDER — ALLOPURINOL 100 MG PO TABS
100.0000 mg | ORAL_TABLET | Freq: Every day | ORAL | Status: DC
  Administered 2015-06-04 – 2015-06-07 (×4): 100 mg via ORAL
  Filled 2015-06-04 (×4): qty 1

## 2015-06-04 MED ORDER — DEXTROSE 5 % IV SOLN
2.0000 g | Freq: Every day | INTRAVENOUS | Status: DC
  Administered 2015-06-05 – 2015-06-07 (×3): 2 g via INTRAVENOUS
  Filled 2015-06-04 (×3): qty 2

## 2015-06-04 MED ORDER — SODIUM CHLORIDE 0.9 % IJ SOLN
3.0000 mL | Freq: Two times a day (BID) | INTRAMUSCULAR | Status: DC
  Administered 2015-06-05 – 2015-06-07 (×5): 3 mL via INTRAVENOUS

## 2015-06-04 MED ORDER — METOPROLOL SUCCINATE ER 50 MG PO TB24
50.0000 mg | ORAL_TABLET | Freq: Every day | ORAL | Status: DC
  Administered 2015-06-04 – 2015-06-07 (×4): 50 mg via ORAL
  Filled 2015-06-04 (×4): qty 1

## 2015-06-04 MED ORDER — FLUCONAZOLE 150 MG PO TABS
150.0000 mg | ORAL_TABLET | Freq: Once | ORAL | Status: AC
  Administered 2015-06-04: 150 mg via ORAL
  Filled 2015-06-04: qty 1

## 2015-06-04 MED ORDER — ENOXAPARIN SODIUM 40 MG/0.4ML ~~LOC~~ SOLN
40.0000 mg | Freq: Every day | SUBCUTANEOUS | Status: DC
  Administered 2015-06-04 – 2015-06-06 (×3): 40 mg via SUBCUTANEOUS
  Filled 2015-06-04 (×3): qty 0.4

## 2015-06-04 NOTE — Progress Notes (Signed)
[facility]                                                                              Patient Demographics  Sally Curry Cassaday, is a 75 y.o. female, DOB - [birth year], ZOX:[id number]  Admit date - 06/03/2015   Admitting Physician Alberteen Samhristopher P Danford, MD  Outpatient Primary MD for the patient is Clide DalesLARK, SHERRY, FNP  LOS - 0   Chief Complaint  Patient presents with  . Flank Pain       Brief HPI   Sally Curry is a 75 y.o. female with a past medical history significant for CAD status post stent, peripheral vascular disease, advanced, sick sinus syndrome with pacer, chronic diastolic CHF, HTN, gout, CKD 3, anemia of renal disease who presents with dyspnea and flank pain.  The patient was in her usual state of health until about 2 weeks ago when she developed a dry cough. This progressively became more noticeable with chest heaviness, paroxysmal nocturnal dyspnea, and difficulty climbing stairs in her home. She lives with her daughter, and notes in the last few days she has had shortness of breath walking across the room. Superimposed upon this, in the last day she has noticed flank pain, similar to her previous kidney stones and increased urinary urgency, so she came to the ER.  In the ED, the patient was hypoxic but hemodynamically stable. Her renal function was normal, she had no leukocytosis, and her lactic acid level was normal. The urinalysis showed some WBCs and bacteria and hyaline casts. A CT angiogram suggested interstitial edema, and the BNP was elevated. Furosemide was administered and TRH was asked to admit for acute CHF.  To me she complained somwhat of dyspnea, but her primary complaint was flank pain, from which she was visibly uncomfortable and anxious.   Assessment & Plan    Principal Problem:  Acute diastolic CHF:  - Patient presented with subacute dyspnea, chest heaviness, exertional intolerance and PND. Elevated BNP and CT findnigs of edema.  Recent echo, EF 55-60% and mild-to-moderate aortic regurg. - Continue Lasix, strict I's and O's and daily weights - Follow troponin, if positive, will consult with cardiology - Recent 2-D echo. EF of 55-60%, recent stress test 10/16 was low risk study - CT chest showed no pulmonary embolism  Acute right-sided pyelonephritis, UTI - Follow urine culture and sensitivities, continue IV Rocephin - Follow blood cultures  Atypical chest pain. History of CAD and HTN:  -Continue Imdur, amlodipine, statin, metoprolol, gemfibrozil, and clopidogrel   Gout:  -Continue allopurinol   CKD stage III:  -Follow BMET closely   Anemia:  At baseline, follow CBC  Depression:  -Continue paroxetine   Code Status: Full CODE STATUS  Family Communication: Discussed in detail with the patient, all imaging results, lab results explained to the patient and daughter at the bedside   Disposition Plan:   Time Spent in minutes   25 minutes  Procedures  CTA chest  Consults   None   DVT Prophylaxis  Lovenox   Medications  Scheduled Meds: . allopurinol  100 mg Oral Daily  . amLODipine  2.5 mg Oral Daily  . [  START ON 06/05/2015] cefTRIAXone (ROCEPHIN)  IV  2 g Intravenous Q0600  . clopidogrel  75 mg Oral Daily  . enoxaparin (LOVENOX) injection  40 mg Subcutaneous Daily  . furosemide  20 mg Intravenous Daily  . gemfibrozil  600 mg Oral BID AC  . isosorbide mononitrate  60 mg Oral Daily  . metoprolol succinate  50 mg Oral Daily  . pantoprazole  40 mg Oral BID  . PARoxetine  40 mg Oral QHS  . potassium chloride SA  20 mEq Oral BID  . pravastatin  40 mg Oral q1800  . sodium chloride  3 mL Intravenous Q12H   Continuous Infusions:  PRN Meds:.acetaminophen **OR** acetaminophen, HYDROmorphone (DILAUDID) injection, ondansetron **OR** ondansetron (ZOFRAN) IV, oxyCODONE   Antibiotics   Anti-infectives    Start     Dose/Rate Route Frequency Ordered Stop   06/05/15 0600  cefTRIAXone (ROCEPHIN) 2 g  in dextrose 5 % 50 mL IVPB     2 g 100 mL/hr over 30 Minutes Intravenous Daily 06/04/15 0835     06/04/15 1000  fluconazole (DIFLUCAN) tablet 150 mg     150 mg Oral  Once 06/04/15 0837 06/04/15 1055   06/04/15 0600  cefTRIAXone (ROCEPHIN) 1 g in dextrose 5 % 50 mL IVPB  Status:  Discontinued     1 g 100 mL/hr over 30 Minutes Intravenous Daily 06/04/15 0539 06/04/15 0835   06/04/15 0445  ceFAZolin (ANCEF) IVPB 2 g/50 mL premix  Status:  Discontinued     2 g 100 mL/hr over 30 Minutes Intravenous Every 12 hours 06/04/15 0437 06/04/15 0538        Subjective:   Justice Aguirre was seen and examined today. C/o midsternal chest pain, back pain. Patient denies dizziness, shortness of breath, abdominal pain, N/V/D/C, new weakness, numbess, tingling. No acute events overnight.    Objective:   Blood pressure 179/54, pulse 93, temperature 98.9 F (37.2 C), temperature source Oral, resp. rate 21, height 5' (1.524 m), weight 87.499 kg (192 lb 14.4 oz), SpO2 95 %.  Wt Readings from Last 3 Encounters:  06/04/15 87.499 kg (192 lb 14.4 oz)  03/16/15 88.451 kg (195 lb)  01/29/14 91.173 kg (201 lb)     Intake/Output Summary (Last 24 hours) at 06/04/15 1153 Last data filed at 06/04/15 0857  Gross per 24 hour  Intake    120 ml  Output    300 ml  Net   -180 ml    Exam  General: Alert and oriented x 3, NAD  HEENT:  PERRLA, EOMI, Anicteric Sclera, mucous membranes moist.   Neck: Supple, no JVD, no masses  CVS: S1 S2 auscultated, no rubs, murmurs or gallops. Regular rate and rhythm.  Respiratory: CTAB, +chest wall tenderness  Abdomen: Soft, nontender, nondistended, + bowel sounds  Ext: no cyanosis clubbing or edema  Neuro: AAOx3, Cr N's II- XII. Strength 5/5 upper and lower extremities bilaterally  Skin: No rashes  Psych: Normal affect and demeanor, alert and oriented x3    Data Review   Micro Results No results found for this or any previous visit (from the past 240  hour(s)).  Radiology Reports Ct Angio Chest Pe W/cm &/or Wo Cm  06/04/2015  CLINICAL DATA:  Acute onset of generalized chest pain and shortness of breath. Elevated D-dimer. Initial encounter. EXAM: CT ANGIOGRAPHY CHEST WITH CONTRAST TECHNIQUE: Multidetector CT imaging of the chest was performed using the standard protocol during bolus administration of intravenous contrast. Multiplanar CT image reconstructions and MIPs were  obtained to evaluate the vascular anatomy. CONTRAST:  55mL OMNIPAQUE IOHEXOL 350 MG/ML SOLN COMPARISON:  Chest radiograph performed 06/03/2015 FINDINGS: There is no evidence of pulmonary embolus. Mild interstitial prominence is noted, with hazy bibasilar opacities, raising concern for mild interstitial edema. Trace bilateral pleural fluid is noted. There is no evidence of pneumothorax. No masses are identified; no abnormal focal contrast enhancement is seen. Diffuse coronary artery calcifications are seen. Pacemaker leads are noted ending at the right atrium and right ventricle. A left-sided pacemaker is noted. The thoracic aorta is borderline normal in caliber. No mediastinal lymphadenopathy is seen. No pericardial effusion is identified. No axillary lymphadenopathy is seen. The visualized portions of the thyroid gland are unremarkable in appearance. The visualized portions of the liver and spleen are unremarkable. There is reflux of contrast into the hepatic veins and IVC. No acute osseous abnormalities are seen. Review of the MIP images confirms the above findings. IMPRESSION: 1. No evidence of pulmonary embolus. 2. Mild interstitial prominence, with hazy bibasilar opacities, concerning for mild interstitial edema. Trace bilateral pleural fluid noted. 3. Diffuse coronary artery calcifications seen. 4. Reflux of contrast into the hepatic veins and IVC. Electronically Signed   By: Roanna Raider M.D.   On: 06/04/2015 01:47   Dg Chest Portable 1 View  06/03/2015  CLINICAL DATA:  Low back  pain since yesterday, questionable fever, cough for 2 weeks, hypertension, diabetes mellitus, history MI and stroke EXAM: PORTABLE CHEST 1 VIEW COMPARISON:  Portable exam 2247 hours compared to 03/16/2015 FINDINGS: LEFT subclavian transvenous pacemaker leads project over RIGHT atrium and RIGHT ventricle, unchanged. Enlargement of cardiac silhouette with pulmonary vascular congestion. Calcified tortuous aorta. Minimal linear atelectasis versus scarring at lingula, stable. No definite acute failure or consolidation. No pleural effusion or pneumothorax. Bones demineralized. IMPRESSION: Enlargement of cardiac silhouette with pulmonary vascular congestion. Minimal chronic atelectasis versus scarring at lingula without definite infiltrate. Electronically Signed   By: Ulyses Southward M.D.   On: 06/03/2015 23:06   Ct Renal Stone Study  06/04/2015  CLINICAL DATA:  Acute onset of upper flank pain for 2 days. Initial encounter. EXAM: CT ABDOMEN AND PELVIS WITHOUT CONTRAST TECHNIQUE: Multidetector CT imaging of the abdomen and pelvis was performed following the standard protocol without IV contrast. COMPARISON:  CT of the abdomen and pelvis from 03/16/2015 FINDINGS: Trace bilateral pleural fluid is noted. Minimal associated atelectasis is seen. Pacemaker leads are partially imaged. The liver and spleen are unremarkable in appearance. The gallbladder is within normal limits. The pancreas and adrenal glands are unremarkable. There is diffusely increased attenuation involving the right renal collecting system, and filling the bladder. On correlation with recent urinalysis results, no blood was seen within the urine. This apparently reflects the high concentration of protein within the urine. Mild nonspecific perinephric stranding is noted bilaterally. Given the patient's symptoms and urinalysis findings, findings are suspicious for right-sided pyelonephritis. No renal or ureteral stones are identified. There is no evidence of  hydronephrosis. No free fluid is identified. The small bowel is unremarkable in appearance. The stomach is within normal limits. No acute vascular abnormalities are seen. Diffuse calcification is noted along the abdominal aorta and its branches. The patient is status post appendectomy. Scattered diverticulosis is noted along the ascending, distal descending and proximal sigmoid colon, without evidence of diverticulitis. Scattered postoperative lobules of fat and surrounding scarring are noted along the anterior abdominal wall. The bladder is mildly distended and filled with high attenuation fluid, as described above. The patient is status post  hysterectomy. No suspicious adnexal masses are seen. No inguinal lymphadenopathy is seen. No acute osseous abnormalities are identified. Multilevel vacuum phenomenon is noted along the lumbar spine. IMPRESSION: 1. Diffusely increased attenuation involving the right renal collecting system, and filling the bladder. No blood was noted on recent urinalysis. This apparently reflects high concentration of protein within the urine. Given the patient's symptoms and urinalysis findings, findings are suspicious for right-sided pyelonephritis. 2. Diffuse calcification along the abdominal aorta and its branches. 3. Scattered diverticulosis along the ascending, distal descending and proximal sigmoid colon, without evidence of diverticulitis. 4. Trace bilateral pleural fluid, with minimal associated atelectasis. 5. Mild degenerative change noted along the lumbar spine. Electronically Signed   By: Roanna Raider M.D.   On: 06/04/2015 03:47    CBC  Recent Labs Lab 06/03/15 2229 06/03/15 2237 06/04/15 0524  WBC 8.1  --  6.6  HGB 10.5* 11.6* 9.9*  HCT 33.7* 34.0* 31.3*  PLT 200  --  193  MCV 97.7  --  98.1  MCH 30.4  --  31.0  MCHC 31.2  --  31.6  RDW 14.4  --  14.4  LYMPHSABS 1.1  --   --   MONOABS 0.9  --   --   EOSABS 0.3  --   --   BASOSABS 0.0  --   --      Chemistries   Recent Labs Lab 06/03/15 2237 06/04/15 0524  NA 143 144  K 4.4 3.9  CL 114* 115*  CO2  --  19*  GLUCOSE 122* 129*  BUN 32* 29*  CREATININE 1.30* 1.31*  CALCIUM  --  9.6  AST  --  17  ALT  --  11*  ALKPHOS  --  91  BILITOT  --  0.7   ------------------------------------------------------------------------------------------------------------------ estimated creatinine clearance is 37.6 mL/min (by C-G formula based on Cr of 1.31). ------------------------------------------------------------------------------------------------------------------ No results for input(s): HGBA1C in the last 72 hours. ------------------------------------------------------------------------------------------------------------------ No results for input(s): CHOL, HDL, LDLCALC, TRIG, CHOLHDL, LDLDIRECT in the last 72 hours. ------------------------------------------------------------------------------------------------------------------ No results for input(s): TSH, T4TOTAL, T3FREE, THYROIDAB in the last 72 hours.  Invalid input(s): FREET3 ------------------------------------------------------------------------------------------------------------------ No results for input(s): VITAMINB12, FOLATE, FERRITIN, TIBC, IRON, RETICCTPCT in the last 72 hours.  Coagulation profile No results for input(s): INR, PROTIME in the last 168 hours.   Recent Labs  06/03/15 2308  DDIMER 1.39*    Cardiac Enzymes No results for input(s): CKMB, TROPONINI, MYOGLOBIN in the last 168 hours.  Invalid input(s): CK ------------------------------------------------------------------------------------------------------------------ Invalid input(s): POCBNP   Recent Labs  06/04/15 0614  GLUCAP 117*     Dasean Brow M.D. [facility] 06/04/2015, 11:53 AM  Pager: [phone] Between 7am to 7pm - call Pager - [phone]  After 7pm go to [email or web address] - password [email or web address]  Call night coverage person  covering after 7pm

## 2015-06-04 NOTE — Progress Notes (Signed)
ANTIBIOTIC CONSULT NOTE - INITIAL  Pharmacy Consult for Cefazolin  Indication: UTI  No Known Allergies  Patient Measurements: 88kg  Vital Signs: Temp: 98.3 F (36.8 C) (01/05 2214) Temp Source: Oral (01/05 2214) BP: 135/94 mmHg (01/06 0400) Pulse Rate: 81 (01/06 0400)  Labs:  Recent Labs  06/03/15 2229 06/03/15 2237  WBC 8.1  --   HGB 10.5* 11.6*  PLT 200  --   CREATININE  --  1.30*    Medical History: Past Medical History  Diagnosis Date  . CAD (coronary artery disease)   . Hypertension   . Stroke (HCC)   . Gout   . PVD (peripheral vascular disease) (HCC)   . Anemia   . Chronic kidney disease   . Anxiety   . CHF (congestive heart failure) (HCC)   . Depression   . Glaucoma   . Presence of permanent cardiac pacemaker     Assessment: 75 y/o F with left flank pain x 2 days, CT with likely pyelonephritis, WBC WNL, mild bump in Scr, starting IV anti-biotics.   Plan:  -Ancef 2g IV q12h -F/U urine culture   Abran DukeLedford, Zyaire Dumas 06/04/2015,4:32 AM

## 2015-06-04 NOTE — ED Notes (Signed)
Requested CT to please transport pt d/t limited staff in pod

## 2015-06-04 NOTE — ED Notes (Signed)
Pt hooked back up to the monitor 

## 2015-06-04 NOTE — H&P (Signed)
History and Physical  Patient Name: Sally Curry     ZOX:[id number]    DOB: 02/11/1941    DOA: 06/03/2015 Referring physician: Earley Favor, PA-C PCP: Clide Dales, FNP      Chief Complaint: Dyspnea and flank pain  HPI: Sally Curry is a 75 y.o. female with a past medical history significant for CAD status post stent, peripheral vascular disease, advanced, sick sinus syndrome with pacer, chronic diastolic CHF, HTN, gout, CKD 3, anemia of renal disease who presents with dyspnea and flank pain.  The patient was in her usual state of health until about 2 weeks ago when she developed a dry cough. This progressively became more noticeable with chest heaviness, paroxysmal nocturnal dyspnea, and difficulty climbing stairs in her home.  She lives with her daughter, and notes in the last few days she has had shortness of breath walking across the room.  Superimposed upon this, in the last day she has noticed flank pain, similar to her previous kidney stones and increased urinary urgency, so she came to the ER.  In the ED, the patient was hypoxic but hemodynamically stable.  Her renal function was normal, she had no leukocytosis, and her lactic acid level was normal. The urinalysis showed some WBCs and bacteria and hyaline casts. A CT angiogram suggested interstitial edema, and the BNP was elevated. Furosemide was administered and TRH was asked to admit for acute CHF.  To me she complained somwhat of dyspnea, but her primary complaint was flank pain, from which she was visibly uncomfortable and anxious.    Review of Systems:  Pt complains of flank pain, lower urinary symptoms.  She also noted dyspnea, PND, chest heaviness, dry cough, SOB on exertion. Pt denies any leg swelling, fever, sputum.  All other systems negative except as just noted or noted in the history of present illness.  No Known Allergies  Prior to Admission medications   Medication Sig Start Date End Date Taking? Authorizing  Provider  allopurinol (ZYLOPRIM) 100 MG tablet Take 100 mg by mouth daily.    Historical Provider, MD  amLODipine (NORVASC) 2.5 MG tablet Take 1 tablet (2.5 mg total) by mouth daily. 03/19/15   Leotis Shames, MD  clopidogrel (PLAVIX) 75 MG tablet Take 75 mg by mouth daily.    Historical Provider, MD  gemfibrozil (LOPID) 600 MG tablet Take 600 mg by mouth 2 (two) times daily before a meal.    Historical Provider, MD  isosorbide mononitrate (IMDUR) 60 MG 24 hr tablet Take 60 mg by mouth daily.    Historical Provider, MD  lovastatin (MEVACOR) 40 MG tablet Take 40 mg by mouth at bedtime.    Historical Provider, MD  metoprolol succinate (TOPROL-XL) 50 MG 24 hr tablet Take 50 mg by mouth daily. Take with or immediately following a meal.    Historical Provider, MD  Multiple Vitamin tablet Take 1 tablet by mouth daily.    Historical Provider, MD  pantoprazole (PROTONIX) 40 MG tablet Take 40 mg by mouth 2 (two) times daily.     Historical Provider, MD  PARoxetine (PAXIL) 30 MG tablet Take 40 mg by mouth at bedtime.     Historical Provider, MD  potassium chloride SA (K-DUR,KLOR-CON) 20 MEQ tablet Take 20 mEq by mouth 2 (two) times daily.     Historical Provider, MD    Past Medical History  Diagnosis Date  . CAD (coronary artery disease)   . Hypertension   . Stroke (HCC)   . Gout   .  PVD (peripheral vascular disease) (HCC)   . Anemia   . Chronic kidney disease   . Anxiety   . CHF (congestive heart failure) (HCC)   . Depression   . Glaucoma   . Presence of permanent cardiac pacemaker     Past Surgical History  Procedure Laterality Date  . Coronary angioplasty with stent placement    . Pacemaker insertion    . Cholecystectomy    . Carotid endarterectomy    . Percutaneous placement intravascular stent cervical carotid artery    . Appendectomy      Family history: family history includes Diabetes in her sister; Emphysema in her father; Hypertension in her mother; Parkinson's disease in her  mother.  Social History: Patient lives with her daughter.  She is not a smoker.  She does use a walker at baseline.       Physical Exam: BP 135/94 mmHg  Pulse 81  Temp(Src) 98.3 F (36.8 C) (Oral)  Resp 17  SpO2 94% General appearance: Obese adult female, alert and in moderate distress from pain.   Eyes: Anicteric, conjunctiva pink, lids and lashes normal.     ENT: No nasal deformity, discharge, or epistaxis.  OP moist without lesions.   Skin: Sweaty and warm. Cardiac: Tachycardic, regular, nl S1-S2, 3/6 systolic murmur appreciated.  Capillary refill is brisk.  Trace LE edema.   Respiratory: Tachypneic.  CTAB without rales.  Expiratory wheezes. Abdomen: Abdomen soft without rigidity.  Moderate diffuse TTP without guarding, ascites, distension.   MSK: No deformities or effusions. Neuro: Sensorium intact and responding to questions, attention normal.  Speech is fluent.  Moves all extremities equally and with normal coordination.   Globally weak. Psych: Behavior appropriate.  Affect anxious.  No evidence of aural or visual hallucinations or delusions.       Labs on Admission:  The metabolic panel shows normal sodium, potassium, bicarbonate. The renal function is stable at her baseline 1.3 mg/dL. Lactic acid level is normal. The troponin is negative. The urinalysis is highly cast, skin PVCs, and few bacteria. The d-dimer is elevated. The BNP is elevated. The complete blood count shows chronic normocytic anemia stable at baseline, no leukocytosis.   Radiological Exams on Admission: Personally reviewed: Dg Chest Portable 1 View 06/03/2015  Pulmonary edema and cardiomegaly  Ct Angio Chest Pe W/cm &/or Wo Cm 06/04/2015 IMPRESSION: 1. No evidence of pulmonary embolus. 2. Mild interstitial prominence, with hazy bibasilar opacities, concerning for mild interstitial edema. Trace bilateral pleural fluid noted. 3. Diffuse coronary artery calcifications seen. 4. Reflux of contrast into the  hepatic veins and IVC. Electronically Signed   By: Roanna RaiderJeffery  Chang M.D.   On: 06/04/2015 01:47   Ct Renal Stone Study 06/04/2015  IMPRESSION: 1. Diffusely increased attenuation involving the right renal collecting system, and filling the bladder. No blood was noted on recent urinalysis. This apparently reflects high concentration of protein within the urine. Given the patient's symptoms and urinalysis findings, findings are suspicious for right-sided pyelonephritis. 2. Diffuse calcification along the abdominal aorta and its branches. 3. Scattered diverticulosis along the ascending, distal descending and proximal sigmoid colon, without evidence of diverticulitis. 4. Trace bilateral pleural fluid, with minimal associated atelectasis. 5. Mild degenerative change noted along the lumbar spine. Electronically Signed   By: Roanna RaiderJeffery  Chang M.D.   On: 06/04/2015 03:47    Echocardiogram 02/2015: EF 55-60%, no valuvar disease   Carotid US 2016: Occluded left ICA, chronic. Moderate right ICA stenosis.      Assessment/Plan 1.  Acute diastolic CHF:  This is new.  Patient presents with subacute dyspnea, chest heaviness, exertional intolerance and PND.  Elevated BNP and CT findnigs of edema.   Recent echo, EF 55-60% and mild-to-moderate aortic regurg. -Furosemide 20 mg IV daily -Strict I/Os, daily weights, goal net negative 1-1.5L daily -Trend BMP    2. Flank pain and UTI:  This is new.  Patient reports previous renal stones.  Urine shows bacteria and some WBC.  No hematuria, but would rule out stone -CT renal study is ordered -Ceftriaxone for UTI  3. CAD and HTN:  -Continue Imdur, amlodipine, statin, metoprolol, gemfibrozil, and clopidogrel  4. Gout:  -Continue allopurinol  5. CKD stage III:  -Trend BMP daily while diuresing  6. Anemia:  At baseline -Trend CBC  7. Depression:  -Continue paroxetine     DVT PPx: Lovenox Diet: Cardiac Consultants: None Code Status: Full Family  Communication: None  Medical decision making: What exists of the patient's previous chart was reviewed in depth and the case was discussed with Earley Favor, PA-C. Patient seen 3:00 AM on 06/04/2015.  Disposition Plan:  Admit for acute CHF and suspected pyelonephritis.  Anticipate 4-5 days hosiptalization for diuresis and treatment of infection.      Alberteen Sam [facility] Pager [phone]

## 2015-06-05 DIAGNOSIS — M10061 Idiopathic gout, right knee: Secondary | ICD-10-CM

## 2015-06-05 DIAGNOSIS — R109 Unspecified abdominal pain: Secondary | ICD-10-CM

## 2015-06-05 LAB — BASIC METABOLIC PANEL
Anion gap: 10 (ref 5–15)
BUN: 30 mg/dL — ABNORMAL HIGH (ref 6–20)
CHLORIDE: 111 mmol/L (ref 101–111)
CO2: 20 mmol/L — ABNORMAL LOW (ref 22–32)
Calcium: 9.3 mg/dL (ref 8.9–10.3)
Creatinine, Ser: 1.36 mg/dL — ABNORMAL HIGH (ref 0.44–1.00)
GFR, EST AFRICAN AMERICAN: 44 mL/min — AB (ref >60)
GFR, EST NON AFRICAN AMERICAN: 38 mL/min — AB (ref >60)
Glucose, Bld: 94 mg/dL (ref 65–99)
POTASSIUM: 4.3 mmol/L (ref 3.5–5.1)
SODIUM: 141 mmol/L (ref 135–145)

## 2015-06-05 LAB — CBC
HEMATOCRIT: 31.5 % — AB (ref 36.0–46.0)
Hemoglobin: 10 g/dL — ABNORMAL LOW (ref 12.0–15.0)
MCH: 31.1 pg (ref 26.0–34.0)
MCHC: 31.7 g/dL (ref 30.0–36.0)
MCV: 97.8 fL (ref 78.0–100.0)
Platelets: 211 10*3/uL (ref 150–400)
RBC: 3.22 MIL/uL — AB (ref 3.87–5.11)
RDW: 14.3 % (ref 11.5–15.5)
WBC: 6.7 10*3/uL (ref 4.0–10.5)

## 2015-06-05 LAB — URINE CULTURE

## 2015-06-05 NOTE — Evaluation (Addendum)
Physical Therapy Evaluation Patient Details Name: Sally Curry MRN: [id number] DOB: [birth year] Today's Date: 06/05/2015   History of Present Illness  75 yo female admitted with progressive SOB and flank pain.  She has the diagnosis of acute CH and UTI.  Past history include CVA with right hemiparesis and large fleshy penducular skin pannus that extends almost to knees in standing   Clinical Impression  Pt is limited by O2 desaturation and dyspnea  with gait and mobility.  She has some residual deficits from old CVA and seems to have some memory impairment and decreased safety awareness.  She wants to go home and will need HHPT and possibly more supervision while daughter is at work     Follow Up Recommendations Home health PT    Equipment Recommendations  None recommended by PT    Recommendations for Other Services OT consult     Precautions / Restrictions Precautions Precautions: Fall Restrictions Weight Bearing Restrictions: No      Mobility  Bed Mobility Overal bed mobility: Needs Assistance Bed Mobility: Supine to Sit     Supine to sit: Mod assist     General bed mobility comments: pt has short limbs and diffuclty moving on mattress with pannus and deacreased  coordinated movment of right leg  Transfers Overall transfer level: Needs assistance Equipment used: Rolling walker (2 wheeled) Transfers: Sit to/from Stand Sit to Stand: Min assist         General transfer comment: needs cues to keep O2 on.  Verbal cues for hand placement and to slow down for safe movement   Ambulation/Gait Ambulation/Gait assistance: Min guard Ambulation Distance (Feet): 10 Feet Assistive device: Rolling walker (2 wheeled) Gait Pattern/deviations: Step-to pattern;Decreased step length - right;Decreased step length - left Gait velocity: pt tends to be impulsive    General Gait Details: decreased O2 sats to 85% even when walking with O2   Stairs            Wheelchair  Mobility    Modified Rankin (Stroke Patients Only)       Balance Overall balance assessment: Modified Independent         Standing balance support: Bilateral upper extremity supported Standing balance-Leahy Scale: Good Standing balance comment: uses rolling walker                              Pertinent Vitals/Pain Pain Assessment: No/denies pain    Home Living Family/patient expects to be discharged to:: Private residence Living Arrangements: Children Available Help at Discharge:  (daughter works as a Engineer, site ) Type of Home: [employer] Home Access: Stairs to [employer] entry   Secretary/administrator of Steps: 1 Home Layout: Two level;1/2 bath on main level        Prior Function Level of Independence: Independent with assistive device(s)         Comments: walks with a two wheeled walker she uses all the time      Hand Dominance   Dominant Hand: Left       Extremity/Trunk Assessment   Lower Extremity Assessment: RLE deficits/detail;LLE deficits/detail RLE Deficits / Details: pt has residual effects of hemiplegia with total extremitly movement pattern with lack of final degrees of knee extension  LLE Deficits / Details: lacks final degress of knee extension, but able to move each joing indepencently. appears to have generalized muscle atrophy  Cervical / Trunk Assessment: Kyphotic (pt upset when asked about pannus says it gets  in her way)  Communication   Communication: No difficulties  Cognition Arousal/Alertness: Awake/alert Behavior During Therapy: WFL for tasks assessed/performed Overall Cognitive Status: No family/caregiver present to determine baseline cognitive functioning       Memory: Decreased short-term memory (pt unable to state why she is in the hospital )              General Comments General comments (skin integrity, edema, etc.): pt adamant about returning home and seems to have been able to get along ok prior to this  illness     Exercises        Assessment/Plan    PT Assessment Patient needs continued PT services  PT Diagnosis Difficulty walking;Abnormality of gait;Generalized weakness;Other (comment)   PT Problem List Decreased strength;Decreased activity tolerance;Decreased mobility;Cardiopulmonary status limiting activity;Obesity  PT Treatment Interventions Gait training;Functional mobility training;Therapeutic activities;Therapeutic exercise;Balance training;Patient/family education   PT Goals (Current goals can be found in the Care Plan section) Acute Rehab PT Goals Patient Stated Goal: to return home PT Goal Formulation: With patient Time For Goal Achievement: 06/19/15 Potential to Achieve Goals: Good    Frequency Min 3X/week   Barriers to discharge    (Pt alone during the day)    Co-evaluation               End of Session                 Time: 0920-1004 PT Time Calculation (min) (ACUTE ONLY): 44 min   Charges:   PT Evaluation $PT Eval Moderate Complexity: 1 Procedure     PT G Codes:       Teresa K. Manson PasseyBrown, PT  06/05/2015, 10:38 AM

## 2015-06-05 NOTE — Progress Notes (Signed)
[facility]                                                                              Patient Demographics  Sally Curry, is a 75 y.o. female, DOB - Sep 26, 1940, ZOX:[id number]  Admit date - 06/03/2015   Admitting Physician Alberteen Sam, MD  Outpatient Primary MD for the patient is Clide Dales, FNP  LOS - 1   Chief Complaint  Patient presents with  . Flank Pain       Brief HPI   Sally Curry is a 75 y.o. female with a past medical history significant for CAD status post stent, peripheral vascular disease, advanced, sick sinus syndrome with pacer, chronic diastolic CHF, HTN, gout, CKD 3, anemia of renal disease who presents with dyspnea and flank pain.  The patient was in her usual state of health until about 2 weeks ago when she developed a dry cough. This progressively became more noticeable with chest heaviness, paroxysmal nocturnal dyspnea, and difficulty climbing stairs in her home. She lives with her daughter, and notes in the last few days she has had shortness of breath walking across the room. Superimposed upon this, in the last day she has noticed flank pain, similar to her previous kidney stones and increased urinary urgency, so she came to the ER.  In the ED, the patient was hypoxic but hemodynamically stable. Her renal function was normal, she had no leukocytosis, and her lactic acid level was normal. The urinalysis showed some WBCs and bacteria and hyaline casts. A CT angiogram suggested interstitial edema, and the BNP was elevated. Furosemide was administered and TRH was asked to admit for acute CHF.  To me she complained somwhat of dyspnea, but her primary complaint was flank pain, from which she was visibly uncomfortable and anxious.   Assessment & Plan    Principal Problem:  Acute diastolic CHF:  - Patient presented with subacute dyspnea, chest heaviness, exertional intolerance and PND. Elevated BNP and CT findnigs of edema.  Recent echo, EF 55-60% and mild-to-moderate aortic regurg. - Continue Lasix, negative balance of 10 5 mL, weight 192->191 - Troponins negative, EKG showed no acute ischemic changes - Recent 2-D echo. EF of 55-60%, recent stress test 10/16 was low risk study - CT chest showed no pulmonary embolism  Acute right-sided pyelonephritis, UTI - continue IV Rocephin - Blood cultures negative so far, urine culture unfortunately showed only 7000 colonies. Per daughter patient had been on antibiotics prior to admission.  Atypical chest pain. History of CAD and HTN:  -Continue Imdur, amlodipine, statin, metoprolol, gemfibrozil, and clopidogrel - Troponins negative, EKG showed no acute ischemic changes - Chest pain has completely resolved   Gout:  -Continue allopurinol   CKD stage III:  -Follow BMET closely   Anemia:  At baseline, follow CBC  Depression:  -Continue paroxetine   Code Status: Full CODE STATUS  Family Communication: Discussed in detail with the patient, all imaging results, lab results explained to the patient at the bedside and daughter on the phone  Disposition Plan: Start physical therapy evaluation today  Time Spent in minutes   25 minutes  Procedures  CTA chest  Consults   None   DVT Prophylaxis  Lovenox   Medications  Scheduled Meds: . allopurinol  100 mg Oral Daily  . amLODipine  2.5 mg Oral Daily  . cefTRIAXone (ROCEPHIN)  IV  2 g Intravenous Q0600  . clopidogrel  75 mg Oral Daily  . enoxaparin (LOVENOX) injection  40 mg Subcutaneous Daily  . furosemide  20 mg Intravenous Daily  . gemfibrozil  600 mg Oral BID AC  . isosorbide mononitrate  60 mg Oral Daily  . metoprolol succinate  50 mg Oral Daily  . pantoprazole  40 mg Oral BID  . PARoxetine  40 mg Oral QHS  . potassium chloride SA  20 mEq Oral BID  . pravastatin  40 mg Oral q1800  . sodium chloride  3 mL Intravenous Q12H   Continuous Infusions:  PRN Meds:.acetaminophen **OR** acetaminophen,  HYDROmorphone (DILAUDID) injection, ondansetron **OR** ondansetron (ZOFRAN) IV, oxyCODONE   Antibiotics   Anti-infectives    Start     Dose/Rate Route Frequency Ordered Stop   06/05/15 0600  cefTRIAXone (ROCEPHIN) 2 g in dextrose 5 % 50 mL IVPB     2 g 100 mL/hr over 30 Minutes Intravenous Daily 06/04/15 0835     06/04/15 1000  fluconazole (DIFLUCAN) tablet 150 mg     150 mg Oral  Once 06/04/15 0837 06/04/15 1055   06/04/15 0600  cefTRIAXone (ROCEPHIN) 1 g in dextrose 5 % 50 mL IVPB  Status:  Discontinued     1 g 100 mL/hr over 30 Minutes Intravenous Daily 06/04/15 0539 06/04/15 0835   06/04/15 0445  ceFAZolin (ANCEF) IVPB 2 g/50 mL premix  Status:  Discontinued     2 g 100 mL/hr over 30 Minutes Intravenous Every 12 hours 06/04/15 0437 06/04/15 0538        Subjective:   Sally Curry was seen and examined today. Feeling a whole lot better today, no chest pain or shortness of breath. Patient denies dizziness, shortness of breath, abdominal pain, N/V/D/C, new weakness, numbess, tingling. No acute events overnight.    Objective:   Blood pressure 168/56, pulse 55, temperature 98.2 F (36.8 C), temperature source Oral, resp. rate 18, height 5' (1.524 m), weight 86.9 kg (191 lb 9.3 oz), SpO2 98 %.  Wt Readings from Last 3 Encounters:  06/05/15 86.9 kg (191 lb 9.3 oz)  03/16/15 88.451 kg (195 lb)  01/29/14 91.173 kg (201 lb)     Intake/Output Summary (Last 24 hours) at 06/05/15 1031 Last data filed at 06/05/15 0856  Gross per 24 hour  Intake    825 ml  Output    750 ml  Net     75 ml    Exam  General: Alert and oriented x 3, NAD  HEENT:  PERRLA, EOMI, Anicteric Sclera, mucous membranes moist.   Neck: Supple, no JVD, no masses  CVS: S1 S2clear, RRR  Respiratory: CTAB, +chest wall tenderness  Abdomen: Soft, nontender, nondistended, + bowel sounds  Ext: no cyanosis clubbing or edema  Neuro: no new deficits  Skin: No rashes  Psych: Normal affect and demeanor,  alert and oriented x3    Data Review   Micro Results Recent Results (from the past 240 hour(s))  Culture, Urine     Status: None   Collection Time: 06/04/15  5:48 AM  Result Value Ref Range Status   Specimen Description URINE, RANDOM  Final   Special Requests NONE  Final   Culture 7,000 COLONIES/mL INSIGNIFICANT GROWTH  Final   Report Status 06/05/2015 FINAL  Final  Culture, blood (routine x 2)     Status: None (Preliminary result)   Collection Time: 06/04/15 11:00 AM  Result Value Ref Range Status   Specimen Description BLOOD BLOOD RIGHT HAND  Final   Special Requests BOTTLES DRAWN AEROBIC AND ANAEROBIC 5CC  Final   Culture NO GROWTH < 24 HOURS  Final   Report Status PENDING  Incomplete  Culture, blood (routine x 2)     Status: None (Preliminary result)   Collection Time: 06/04/15 11:06 AM  Result Value Ref Range Status   Specimen Description BLOOD BLOOD LEFT HAND  Final   Special Requests BOTTLES DRAWN AEROBIC AND ANAEROBIC 5CC  Final   Culture NO GROWTH < 24 HOURS  Final   Report Status PENDING  Incomplete    Radiology Reports Ct Angio Chest Pe W/cm &/or Wo Cm  06/04/2015  CLINICAL DATA:  Acute onset of generalized chest pain and shortness of breath. Elevated D-dimer. Initial encounter. EXAM: CT ANGIOGRAPHY CHEST WITH CONTRAST TECHNIQUE: Multidetector CT imaging of the chest was performed using the standard protocol during bolus administration of intravenous contrast. Multiplanar CT image reconstructions and MIPs were obtained to evaluate the vascular anatomy. CONTRAST:  55mL OMNIPAQUE IOHEXOL 350 MG/ML SOLN COMPARISON:  Chest radiograph performed 06/03/2015 FINDINGS: There is no evidence of pulmonary embolus. Mild interstitial prominence is noted, with hazy bibasilar opacities, raising concern for mild interstitial edema. Trace bilateral pleural fluid is noted. There is no evidence of pneumothorax. No masses are identified; no abnormal focal contrast enhancement is seen. Diffuse  coronary artery calcifications are seen. Pacemaker leads are noted ending at the right atrium and right ventricle. A left-sided pacemaker is noted. The thoracic aorta is borderline normal in caliber. No mediastinal lymphadenopathy is seen. No pericardial effusion is identified. No axillary lymphadenopathy is seen. The visualized portions of the thyroid gland are unremarkable in appearance. The visualized portions of the liver and spleen are unremarkable. There is reflux of contrast into the hepatic veins and IVC. No acute osseous abnormalities are seen. Review of the MIP images confirms the above findings. IMPRESSION: 1. No evidence of pulmonary embolus. 2. Mild interstitial prominence, with hazy bibasilar opacities, concerning for mild interstitial edema. Trace bilateral pleural fluid noted. 3. Diffuse coronary artery calcifications seen. 4. Reflux of contrast into the hepatic veins and IVC. Electronically Signed   By: Roanna RaiderJeffery  Chang M.D.   On: 06/04/2015 01:47   Dg Chest Portable 1 View  06/03/2015  CLINICAL DATA:  Low back pain since yesterday, questionable fever, cough for 2 weeks, hypertension, diabetes mellitus, history MI and stroke EXAM: PORTABLE CHEST 1 VIEW COMPARISON:  Portable exam 2247 hours compared to 03/16/2015 FINDINGS: LEFT subclavian transvenous pacemaker leads project over RIGHT atrium and RIGHT ventricle, unchanged. Enlargement of cardiac silhouette with pulmonary vascular congestion. Calcified tortuous aorta. Minimal linear atelectasis versus scarring at lingula, stable. No definite acute failure or consolidation. No pleural effusion or pneumothorax. Bones demineralized. IMPRESSION: Enlargement of cardiac silhouette with pulmonary vascular congestion. Minimal chronic atelectasis versus scarring at lingula without definite infiltrate. Electronically Signed   By: Ulyses SouthwardMark  Boles M.D.   On: 06/03/2015 23:06   Ct Renal Stone Study  06/04/2015  CLINICAL DATA:  Acute onset of upper flank pain for 2  days. Initial encounter. EXAM: CT ABDOMEN AND PELVIS WITHOUT CONTRAST TECHNIQUE: Multidetector CT imaging of the abdomen and pelvis was performed following the standard protocol without IV contrast. COMPARISON:  CT of the abdomen  and pelvis from 03/16/2015 FINDINGS: Trace bilateral pleural fluid is noted. Minimal associated atelectasis is seen. Pacemaker leads are partially imaged. The liver and spleen are unremarkable in appearance. The gallbladder is within normal limits. The pancreas and adrenal glands are unremarkable. There is diffusely increased attenuation involving the right renal collecting system, and filling the bladder. On correlation with recent urinalysis results, no blood was seen within the urine. This apparently reflects the high concentration of protein within the urine. Mild nonspecific perinephric stranding is noted bilaterally. Given the patient's symptoms and urinalysis findings, findings are suspicious for right-sided pyelonephritis. No renal or ureteral stones are identified. There is no evidence of hydronephrosis. No free fluid is identified. The small bowel is unremarkable in appearance. The stomach is within normal limits. No acute vascular abnormalities are seen. Diffuse calcification is noted along the abdominal aorta and its branches. The patient is status post appendectomy. Scattered diverticulosis is noted along the ascending, distal descending and proximal sigmoid colon, without evidence of diverticulitis. Scattered postoperative lobules of fat and surrounding scarring are noted along the anterior abdominal wall. The bladder is mildly distended and filled with high attenuation fluid, as described above. The patient is status post hysterectomy. No suspicious adnexal masses are seen. No inguinal lymphadenopathy is seen. No acute osseous abnormalities are identified. Multilevel vacuum phenomenon is noted along the lumbar spine. IMPRESSION: 1. Diffusely increased attenuation involving  the right renal collecting system, and filling the bladder. No blood was noted on recent urinalysis. This apparently reflects high concentration of protein within the urine. Given the patient's symptoms and urinalysis findings, findings are suspicious for right-sided pyelonephritis. 2. Diffuse calcification along the abdominal aorta and its branches. 3. Scattered diverticulosis along the ascending, distal descending and proximal sigmoid colon, without evidence of diverticulitis. 4. Trace bilateral pleural fluid, with minimal associated atelectasis. 5. Mild degenerative change noted along the lumbar spine. Electronically Signed   By: Roanna Raider M.D.   On: 06/04/2015 03:47    CBC  Recent Labs Lab 06/03/15 2229 06/03/15 2237 06/04/15 0524 06/05/15 0321  WBC 8.1  --  6.6 6.7  HGB 10.5* 11.6* 9.9* 10.0*  HCT 33.7* 34.0* 31.3* 31.5*  PLT 200  --  193 211  MCV 97.7  --  98.1 97.8  MCH 30.4  --  31.0 31.1  MCHC 31.2  --  31.6 31.7  RDW 14.4  --  14.4 14.3  LYMPHSABS 1.1  --   --   --   MONOABS 0.9  --   --   --   EOSABS 0.3  --   --   --   BASOSABS 0.0  --   --   --     Chemistries   Recent Labs Lab 06/03/15 2237 06/04/15 0524 06/05/15 0321  NA 143 144 141  K 4.4 3.9 4.3  CL 114* 115* 111  CO2  --  19* 20*  GLUCOSE 122* 129* 94  BUN 32* 29* 30*  CREATININE 1.30* 1.31* 1.36*  CALCIUM  --  9.6 9.3  AST  --  17  --   ALT  --  11*  --   ALKPHOS  --  91  --   BILITOT  --  0.7  --    ------------------------------------------------------------------------------------------------------------------ estimated creatinine clearance is 36.1 mL/min (by C-G formula based on Cr of 1.36). ------------------------------------------------------------------------------------------------------------------ No results for input(s): HGBA1C in the last 72 hours. ------------------------------------------------------------------------------------------------------------------ No results for  input(s): CHOL, HDL, LDLCALC, TRIG, CHOLHDL, LDLDIRECT in the last 72  hours. ------------------------------------------------------------------------------------------------------------------ No results for input(s): TSH, T4TOTAL, T3FREE, THYROIDAB in the last 72 hours.  Invalid input(s): FREET3 ------------------------------------------------------------------------------------------------------------------ No results for input(s): VITAMINB12, FOLATE, FERRITIN, TIBC, IRON, RETICCTPCT in the last 72 hours.  Coagulation profile No results for input(s): INR, PROTIME in the last 168 hours.   Recent Labs  06/03/15 2308  DDIMER 1.39*    Cardiac Enzymes  Recent Labs Lab 06/04/15 1100 06/04/15 1642 06/04/15 2048  TROPONINI 0.03 0.03 0.03   ------------------------------------------------------------------------------------------------------------------ Invalid input(s): POCBNP   Recent Labs  06/04/15 0614 06/04/15 1156 06/04/15 1557 06/04/15 2044  GLUCAP 117* 119* 133* 91     Zakaria Fromer M.D. [facility] 06/05/2015, 10:31 AM  Pager: [phone] Between 7am to 7pm - call Pager - [phone]  After 7pm go to [email or web address] - password [email or web address]  Call night coverage person covering after 7pm

## 2015-06-06 LAB — BASIC METABOLIC PANEL
ANION GAP: 9 (ref 5–15)
BUN: 44 mg/dL — ABNORMAL HIGH (ref 6–20)
CO2: 21 mmol/L — ABNORMAL LOW (ref 22–32)
Calcium: 9.1 mg/dL (ref 8.9–10.3)
Chloride: 111 mmol/L (ref 101–111)
Creatinine, Ser: 1.84 mg/dL — ABNORMAL HIGH (ref 0.44–1.00)
GFR calc Af Amer: 30 mL/min — ABNORMAL LOW (ref >60)
GFR, EST NON AFRICAN AMERICAN: 26 mL/min — AB (ref >60)
GLUCOSE: 122 mg/dL — AB (ref 65–99)
POTASSIUM: 4.4 mmol/L (ref 3.5–5.1)
Sodium: 141 mmol/L (ref 135–145)

## 2015-06-06 MED ORDER — POTASSIUM CHLORIDE CRYS ER 20 MEQ PO TBCR
20.0000 meq | EXTENDED_RELEASE_TABLET | Freq: Every day | ORAL | Status: DC
  Administered 2015-06-07: 20 meq via ORAL
  Filled 2015-06-06: qty 1

## 2015-06-06 MED ORDER — GUAIFENESIN-DM 100-10 MG/5ML PO SYRP
5.0000 mL | ORAL_SOLUTION | ORAL | Status: DC | PRN
  Administered 2015-06-06 – 2015-06-07 (×4): 5 mL via ORAL
  Filled 2015-06-06 (×4): qty 5

## 2015-06-06 MED ORDER — ENOXAPARIN SODIUM 30 MG/0.3ML ~~LOC~~ SOLN
30.0000 mg | Freq: Every day | SUBCUTANEOUS | Status: DC
  Administered 2015-06-07: 30 mg via SUBCUTANEOUS
  Filled 2015-06-06: qty 0.3

## 2015-06-06 MED ORDER — HYDRALAZINE HCL 50 MG PO TABS
100.0000 mg | ORAL_TABLET | Freq: Three times a day (TID) | ORAL | Status: DC
  Administered 2015-06-06 – 2015-06-07 (×4): 100 mg via ORAL
  Filled 2015-06-06 (×4): qty 2

## 2015-06-06 NOTE — Progress Notes (Signed)
Up in chair most of the day and tolerating well.  Started on scheduled Hydralazine po for elevated b/ps.  Patient informed of plan of care.

## 2015-06-06 NOTE — Progress Notes (Signed)
Home 02 evaluation completed.  02 sat = 94 % on room air at rest, pre-ambulation.  Ambulated on room air 175 feet and 02 saturation dropped to 85% and patient exhibited dyspnea and needed to pause to rest.  02 applied and 02 sat up to 92% on 1L, then 94% on room air after back in chair at rest.

## 2015-06-06 NOTE — Progress Notes (Signed)
[facility]                                                                              Patient Demographics  Sally Curry, is a 75 y.o. female, DOB - 1940-07-28, ZOX:[id number]  Admit date - 06/03/2015   Admitting Physician Alberteen Sam, MD  Outpatient Primary MD for the patient is Clide Dales, FNP  LOS - 2   Chief Complaint  Patient presents with  . Flank Pain       Brief HPI   Sally Curry is a 75 y.o. female with a past medical history significant for CAD status post stent, peripheral vascular disease, advanced, sick sinus syndrome with pacer, chronic diastolic CHF, HTN, gout, CKD 3, anemia of renal disease who presents with dyspnea and flank pain.  The patient was in her usual state of health until about 2 weeks ago when she developed a dry cough. This progressively became more noticeable with chest heaviness, paroxysmal nocturnal dyspnea, and difficulty climbing stairs in her home. She lives with her daughter, and notes in the last few days she has had shortness of breath walking across the room. Superimposed upon this, in the last day she has noticed flank pain, similar to her previous kidney stones and increased urinary urgency, so she came to the ER.  In the ED, the patient was hypoxic but hemodynamically stable. Her renal function was normal, she had no leukocytosis, and her lactic acid level was normal. The urinalysis showed some WBCs and bacteria and hyaline casts. A CT angiogram suggested interstitial edema, and the BNP was elevated. Furosemide was administered and TR[facility] was asked to admit for acute CHF.  To me she complained somwhat of dyspnea, but her primary complaint was flank pain, from which she was visibly uncomfortable and anxious.   Assessment & Plan    Principal Problem:  Acute diastolic CHF: Improving - Patient presented with subacute dyspnea, chest heaviness, exertional intolerance and PND. Elevated BNP and CT findnigs of  edema. Recent echo, EF 55-60% and mild-to-moderate aortic regurg. - Creatinine trending up, hold Lasix today. May be able to restart oral Lasix tomorrow  -  negative balance of 1.2L, weight 192->191 - Troponins negative, EKG showed no acute ischemic changes - Recent 2-D echo. EF of 55-60%, recent stress test 10/16 was low risk study - CT chest showed no pulmonary embolism  Acute right-sided pyelonephritis, UTI - continue IV Rocephin - Blood cultures negative so far, urine culture unfortunately showed only 7000 colonies. Per daughter patient had been on antibiotics prior to admission.  Atypical chest pain. History of CAD and HTN: Resolved -Continue Imdur, amlodipine, statin, metoprolol, gemfibrozil, and clopidogrel - Troponins negative, EKG showed no acute ischemic changes - Chest pain has completely resolved   Gout:  -Continue allopurinol  Acute kidney injury on CKD stage III:  -Likely due to diuresis, hold Lasix today   Anemia:  At baseline  Depression:  -Continue paroxetine   Code Status: Full CODE STATUS  Family Communication: Discussed in detail with the patient, all imaging results, lab results explained to the patient at the bedside. Called patient's daughter, unable to make contact  Disposition Plan: Likely DC home in a.m. if creatinine improving  Time Spent in minutes   25 minutes  Procedures  CTA chest  Consults   None   DVT Prophylaxis  Lovenox   Medications  Scheduled Meds: . allopurinol  100 mg Oral Daily  . amLODipine  2.5 mg Oral Daily  . cefTRIAXone (ROCEPHIN)  IV  2 g Intravenous Q0600  . clopidogrel  75 mg Oral Daily  . enoxaparin (LOVENOX) injection  40 mg Subcutaneous Daily  . furosemide  20 mg Intravenous Daily  . gemfibrozil  600 mg Oral BID AC  . hydrALAZINE  100 mg Oral TID  . isosorbide mononitrate  60 mg Oral Daily  . metoprolol succinate  50 mg Oral Daily  . pantoprazole  40 mg Oral BID  . PARoxetine  40 mg Oral QHS  .  potassium chloride SA  20 mEq Oral BID  . pravastatin  40 mg Oral q1800  . sodium chloride  3 mL Intravenous Q12H   Continuous Infusions:  PRN Meds:.acetaminophen **OR** acetaminophen, guaiFENesin-dextromethorphan, HYDROmorphone (DILAUDID) injection, ondansetron **OR** ondansetron (ZOFRAN) IV, oxyCODONE   Antibiotics   Anti-infectives    Start     Dose/Rate Route Frequency Ordered Stop   06/05/15 0600  cefTRIAXone (ROCEPHIN) 2 g in dextrose 5 % 50 mL IVPB     2 g 100 mL/hr over 30 Minutes Intravenous Daily 06/04/15 0835     06/04/15 1000  fluconazole (DIFLUCAN) tablet 150 mg     150 mg Oral  Once 06/04/15 0837 06/04/15 1055   06/04/15 0600  cefTRIAXone (ROCEPHIN) 1 g in dextrose 5 % 50 mL IVPB  Status:  Discontinued     1 g 100 mL/hr over 30 Minutes Intravenous Daily 06/04/15 0539 06/04/15 0835   06/04/15 0445  ceFAZolin (ANCEF) IVPB 2 g/50 mL premix  Status:  Discontinued     2 g 100 mL/hr over 30 Minutes Intravenous Every 12 hours 06/04/15 0437 06/04/15 0538        Subjective:   Sally Curry was seen and examined today. Feeling better, no complaints, eating breakfast.  Patient denies dizziness, shortness of breath, abdominal pain, N/V/D/C, new weakness, numbess, tingling. No acute events overnight.  No chest pain  Objective:   Blood pressure 171/49, pulse 78, temperature 97.8 F (36.6 C), temperature source Oral, resp. rate 18, height 5' (1.524 m), weight 86.909 kg (191 lb 9.6 oz), SpO2 98 %.  Wt Readings from Last 3 Encounters:  06/06/15 86.909 kg (191 lb 9.6 oz)  03/16/15 88.451 kg (195 lb)  01/29/14 91.173 kg (201 lb)     Intake/Output Summary (Last 24 hours) at 06/06/15 1140 Last data filed at 06/06/15 0916  Gross per 24 hour  Intake    785 ml  Output   1525 ml  Net   -740 ml    Exam  General: Alert and oriented x 3, NAD  HEENT:  PERRLA, EOMI  Neck: Supple, no JVD, no masses  CVS: S1 S2clear, RRR  Respiratory: CTAB  Abdomen: Soft, NT, ND,  NBS  Ext: no cyanosis clubbing or edema  Neuro: no new deficits  Skin: No rashes  Psych: Normal affect and demeanor, alert and oriented x3    Data Review   Micro Results Recent Results (from the past 240 hour(s))  Culture, Urine     Status: None   Collection Time: 06/04/15  5:48 AM  Result Value Ref Range Status   Specimen Description URINE, RANDOM  Final  Special Requests NONE  Final   Culture 7,000 COLONIES/mL INSIGNIFICANT GROWTH  Final   Report Status 06/05/2015 FINAL  Final  Culture, blood (routine x 2)     Status: None (Preliminary result)   Collection Time: 06/04/15 11:00 AM  Result Value Ref Range Status   Specimen Description BLOOD BLOOD RIGHT HAND  Final   Special Requests BOTTLES DRAWN AEROBIC AND ANAEROBIC 5CC  Final   Culture NO GROWTH 2 DAYS  Final   Report Status PENDING  Incomplete  Culture, blood (routine x 2)     Status: None (Preliminary result)   Collection Time: 06/04/15 11:06 AM  Result Value Ref Range Status   Specimen Description BLOOD BLOOD LEFT HAND  Final   Special Requests BOTTLES DRAWN AEROBIC AND ANAEROBIC 5CC  Final   Culture NO GROWTH 2 DAYS  Final   Report Status PENDING  Incomplete    Radiology Reports Ct Angio Chest Pe W/cm &/or Wo Cm  06/04/2015  CLINICAL DATA:  Acute onset of generalized chest pain and shortness of breath. Elevated D-dimer. Initial encounter. EXAM: CT ANGIOGRAPHY CHEST WITH CONTRAST TECHNIQUE: Multidetector CT imaging of the chest was performed using the standard protocol during bolus administration of intravenous contrast. Multiplanar CT image reconstructions and MIPs were obtained to evaluate the vascular anatomy. CONTRAST:  55mL OMNIPAQUE IOHEXOL 350 MG/ML SOLN COMPARISON:  Chest radiograph performed 06/03/2015 FINDINGS: There is no evidence of pulmonary embolus. Mild interstitial prominence is noted, with hazy bibasilar opacities, raising concern for mild interstitial edema. Trace bilateral pleural fluid is noted. There  is no evidence of pneumothorax. No masses are identified; no abnormal focal contrast enhancement is seen. Diffuse coronary artery calcifications are seen. Pacemaker leads are noted ending at the right atrium and right ventricle. A left-sided pacemaker is noted. The thoracic aorta is borderline normal in caliber. No mediastinal lymphadenopathy is seen. No pericardial effusion is identified. No axillary lymphadenopathy is seen. The visualized portions of the thyroid gland are unremarkable in appearance. The visualized portions of the liver and spleen are unremarkable. There is reflux of contrast into the hepatic veins and IVC. No acute osseous abnormalities are seen. Review of the MIP images confirms the above findings. IMPRESSION: 1. No evidence of pulmonary embolus. 2. Mild interstitial prominence, with hazy bibasilar opacities, concerning for mild interstitial edema. Trace bilateral pleural fluid noted. 3. Diffuse coronary artery calcifications seen. 4. Reflux of contrast into the hepatic veins and IVC. Electronically Signed   By: Roanna Raider M.D.   On: 06/04/2015 01:47   Dg Chest Portable 1 View  06/03/2015  CLINICAL DATA:  Low back pain since yesterday, questionable fever, cough for 2 weeks, hypertension, diabetes mellitus, history MI and stroke EXAM: PORTABLE CHEST 1 VIEW COMPARISON:  Portable exam 2247 hours compared to 03/16/2015 FINDINGS: LEFT subclavian transvenous pacemaker leads project over RIGHT atrium and RIGHT ventricle, unchanged. Enlargement of cardiac silhouette with pulmonary vascular congestion. Calcified tortuous aorta. Minimal linear atelectasis versus scarring at lingula, stable. No definite acute failure or consolidation. No pleural effusion or pneumothorax. Bones demineralized. IMPRESSION: Enlargement of cardiac silhouette with pulmonary vascular congestion. Minimal chronic atelectasis versus scarring at lingula without definite infiltrate. Electronically Signed   By: Ulyses Southward M.D.    On: 06/03/2015 23:06   Ct Renal Stone Study  06/04/2015  CLINICAL DATA:  Acute onset of upper flank pain for 2 days. Initial encounter. EXAM: CT ABDOMEN AND PELVIS WITHOUT CONTRAST TECHNIQUE: Multidetector CT imaging of the abdomen and pelvis was performed following the  standard protocol without IV contrast. COMPARISON:  CT of the abdomen and pelvis from 03/16/2015 FINDINGS: Trace bilateral pleural fluid is noted. Minimal associated atelectasis is seen. Pacemaker leads are partially imaged. The liver and spleen are unremarkable in appearance. The gallbladder is within normal limits. The pancreas and adrenal glands are unremarkable. There is diffusely increased attenuation involving the right renal collecting system, and filling the bladder. On correlation with recent urinalysis results, no blood was seen within the urine. This apparently reflects the high concentration of protein within the urine. Mild nonspecific perinephric stranding is noted bilaterally. Given the patient's symptoms and urinalysis findings, findings are suspicious for right-sided pyelonephritis. No renal or ureteral stones are identified. There is no evidence of hydronephrosis. No free fluid is identified. The small bowel is unremarkable in appearance. The stomach is within normal limits. No acute vascular abnormalities are seen. Diffuse calcification is noted along the abdominal aorta and its branches. The patient is status post appendectomy. Scattered diverticulosis is noted along the ascending, distal descending and proximal sigmoid colon, without evidence of diverticulitis. Scattered postoperative lobules of fat and surrounding scarring are noted along the anterior abdominal wall. The bladder is mildly distended and filled with high attenuation fluid, as described above. The patient is status post hysterectomy. No suspicious adnexal masses are seen. No inguinal lymphadenopathy is seen. No acute osseous abnormalities are identified.  Multilevel vacuum phenomenon is noted along the lumbar spine. IMPRESSION: 1. Diffusely increased attenuation involving the right renal collecting system, and filling the bladder. No blood was noted on recent urinalysis. This apparently reflects high concentration of protein within the urine. Given the patient's symptoms and urinalysis findings, findings are suspicious for right-sided pyelonephritis. 2. Diffuse calcification along the abdominal aorta and its branches. 3. Scattered diverticulosis along the ascending, distal descending and proximal sigmoid colon, without evidence of diverticulitis. 4. Trace bilateral pleural fluid, with minimal associated atelectasis. 5. Mild degenerative change noted along the lumbar spine. Electronically Signed   By: Roanna Raider M.D.   On: 06/04/2015 03:47    CBC  Recent Labs Lab 06/03/15 2229 06/03/15 2237 06/04/15 0524 06/05/15 0321  WBC 8.1  --  6.6 6.7  HGB 10.5* 11.6* 9.9* 10.0*  HCT 33.7* 34.0* 31.3* 31.5*  PLT 200  --  193 211  MCV 97.7  --  98.1 97.8  MCH 30.4  --  31.0 31.1  MCHC 31.2  --  31.6 31.7  RDW 14.4  --  14.4 14.3  LYMPHSABS 1.1  --   --   --   MONOABS 0.9  --   --   --   EOSABS 0.3  --   --   --   BASOSABS 0.0  --   --   --     Chemistries   Recent Labs Lab 06/03/15 2237 06/04/15 0524 06/05/15 0321 06/06/15 0319  NA 143 144 141 141  K 4.4 3.9 4.3 4.4  CL 114* 115* 111 111  CO2  --  19* 20* 21*  GLUCOSE 122* 129* 94 122*  BUN 32* 29* 30* 44*  CREATININE 1.30* 1.31* 1.36* 1.84*  CALCIUM  --  9.6 9.3 9.1  AST  --  17  --   --   ALT  --  11*  --   --   ALKPHOS  --  91  --   --   BILITOT  --  0.7  --   --    ------------------------------------------------------------------------------------------------------------------ estimated creatinine clearance is 26.7 mL/min (by C-G  formula based on Cr of  1.84). ------------------------------------------------------------------------------------------------------------------ No results for input(s): HGBA1C in the last 72 hours. ------------------------------------------------------------------------------------------------------------------ No results for input(s): CHOL, HDL, LDLCALC, TRIG, CHOLHDL, LDLDIRECT in the last 72 hours. ------------------------------------------------------------------------------------------------------------------ No results for input(s): TSH, T4TOTAL, T3FREE, THYROIDAB in the last 72 hours.  Invalid input(s): FREET3 ------------------------------------------------------------------------------------------------------------------ No results for input(s): VITAMINB12, FOLATE, FERRITIN, TIBC, IRON, RETICCTPCT in the last 72 hours.  Coagulation profile No results for input(s): INR, PROTIME in the last 168 hours.   Recent Labs  06/03/15 2308  DDIMER 1.39*    Cardiac Enzymes  Recent Labs Lab 06/04/15 1100 06/04/15 1642 06/04/15 2048  TROPONINI 0.03 0.03 0.03   ------------------------------------------------------------------------------------------------------------------ Invalid input(s): POCBNP   Recent Labs  06/04/15 0614 06/04/15 1156 06/04/15 1557 06/04/15 2044  GLUCAP 117* 119* 133* 91     Sarai January M.D. [facility] 06/06/2015, 11:40 AM  Pager: [phone] Between 7am to 7pm - call Pager - [phone]  After 7pm go to [email or web address] - password [email or web address]  Call night coverage person covering after 7pm

## 2015-06-07 LAB — BASIC METABOLIC PANEL
Anion gap: 12 (ref 5–15)
BUN: 47 mg/dL — AB (ref 6–20)
CHLORIDE: 112 mmol/L — AB (ref 101–111)
CO2: 18 mmol/L — AB (ref 22–32)
CREATININE: 1.74 mg/dL — AB (ref 0.44–1.00)
Calcium: 9.4 mg/dL (ref 8.9–10.3)
GFR calc Af Amer: 32 mL/min — ABNORMAL LOW (ref >60)
GFR calc non Af Amer: 28 mL/min — ABNORMAL LOW (ref >60)
GLUCOSE: 115 mg/dL — AB (ref 65–99)
Potassium: 4.2 mmol/L (ref 3.5–5.1)
Sodium: 142 mmol/L (ref 135–145)

## 2015-06-07 MED ORDER — POTASSIUM CHLORIDE CRYS ER 20 MEQ PO TBCR: 20.0000 meq | EXTENDED_RELEASE_TABLET | Freq: Two times a day (BID) | ORAL | Status: DC

## 2015-06-07 MED ORDER — FUROSEMIDE 20 MG PO TABS: 20.0000 mg | ORAL_TABLET | Freq: Every day | ORAL | Status: DC

## 2015-06-07 MED ORDER — CEFPODOXIME PROXETIL 200 MG PO TABS: 200.0000 mg | ORAL_TABLET | Freq: Every day | ORAL | Status: DC

## 2015-06-07 MED ORDER — FLUCONAZOLE 150 MG PO TABS
150.0000 mg | ORAL_TABLET | Freq: Once | ORAL | Status: AC
  Administered 2015-06-07: 150 mg via ORAL
  Filled 2015-06-07: qty 1

## 2015-06-07 MED ORDER — FLUCONAZOLE 150 MG PO TABS: 150.0000 mg | ORAL_TABLET | ORAL | Status: DC | PRN

## 2015-06-07 MED ORDER — CEFPODOXIME PROXETIL 200 MG PO TABS
200.0000 mg | ORAL_TABLET | Freq: Every day | ORAL | Status: DC
  Administered 2015-06-07: 200 mg via ORAL
  Filled 2015-06-07: qty 1

## 2015-06-07 NOTE — Progress Notes (Signed)
Physical Therapy Treatment Patient Details Name: Sally Curry MRN: [id number] DOB: December 30, 1940 Today's Date: 06/07/2015    History of Present Illness 75 yo female admitted with progressive SOB and flank pain.  She has the diagnosis of acute CH and UTI.  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  Still SOB and dropping to 87% with ambulation on RA.  Cautioned on risk for falling at home and need for assist initially till comfortable managing home O2 and especially if negotiating steps to access shower.  Will need HHPT at d/c.  Follow Up Recommendations  Home health PT;Supervision - Intermittent (assist on stairs and initially with ambulation due to new home O2)     Equipment Recommendations  None recommended by PT    Recommendations for Other Services       Precautions / Restrictions Precautions Precautions: Fall Precaution Comments: check O2 Restrictions Weight Bearing Restrictions: No    Mobility  Bed Mobility   Bed Mobility: Sit to Supine     Supine to sit: Supervision Sit to supine: Supervision   General bed mobility comments: increased time needed and supervision for safety supine to sit due to close to EOB  Transfers Overall transfer level: Needs assistance Equipment used: Rolling walker (2 wheeled) Transfers: Sit to/from Stand Sit to Stand: Supervision         General transfer comment: able to stand without physical help  Ambulation/Gait Ambulation/Gait assistance: Supervision   Assistive device: Rolling walker (2 wheeled) Gait Pattern/deviations: Step-through pattern;Ataxic;Wide base of support;Decreased dorsiflexion - right;Decreased stride length     General Gait Details: cuesfor R UE placement on walker, assist  for safety/balance; educated in fall prevention techniques for home (shoes on, lights on, nothing too long she can trip on, assist on stairs)  donned her shoes for ambulation today   Stairs            Wheelchair Mobility    Modified Rankin (Stroke Patients Only)       Balance           Standing balance support: Bilateral upper extremity supported Standing balance-Leahy Scale: Poor Standing balance comment: needs UE support for balance in standing                    Cognition Arousal/Alertness: Awake/alert Behavior During Therapy: WFL for tasks assessed/performed Overall Cognitive Status: No family/caregiver present to determine baseline cognitive functioning                      Exercises General Exercises - Lower Extremity Ankle Circles/Pumps: PROM;AROM;Right;Left;10 reps;Seated (passive stretch to R PF's)    General Comments General comments (skin integrity, edema, etc.): educated about safety managing new home O2 and increased fall risk espeically now that she's weaker from this illness.  Patient states has upstairs in her home where the shower is.  Educated not to attempt without help.      Pertinent Vitals/Pain Pain Assessment: No/denies pain    Home Living                      Prior Function            PT Goals (current goals can now be found in the care plan section) Progress towards  PT goals: Progressing toward goals    Frequency  Min 3X/week    PT Plan Current plan remains appropriate    Co-evaluation             End of Session Equipment Utilized During Treatment: Gait belt;Oxygen Activity Tolerance: Patient limited by fatigue;Patient tolerated treatment well Patient left: in chair;with call bell/phone within reach     Time: 0915-0949 PT Time Calculation (min) (ACUTE ONLY): 34 min  Charges:  $Gait Training: 23-37 mins                    G Codes:      Elray McgregorCynthia Tania Perrott 06/07/2015, 10:36 AM  Sheran Lawlessyndi Bralyn Folkert,  PT [phone] 06/07/2015

## 2015-06-07 NOTE — Discharge Summary (Signed)
Physician Discharge Summary   Patient ID: Sally Curry MRN: [id number] DOB/AGE: 06/30/1940 75 y.o.  Admit date: 06/03/2015 Discharge date: 06/07/2015  Primary Care Physician:  Clide Dales, FNP  Discharge Diagnoses:    Pyelonephritis acute  Acute on chronic diastolic CHF    . Essential hypertension, benign . Gout of right knee . Depression with anxiety . UTI (lower urinary tract infection) Acute respiratory failure   Consults:  none   Recommendations for Outpatient Follow-up:  1. Home health PT, OT, RN arranged 2. Home O2 with ambulation 2 L arranged by case management 3. Please repeat CBC/BMET at next visit   DIET: Heart healthy diet    Allergies:  No Known Allergies   DISCHARGE MEDICATIONS: Current Discharge Medication List    START taking these medications   Details  cefpodoxime (VANTIN) 200 MG tablet Take 1 tablet (200 mg total) by mouth daily. X 12 more days Qty: 12 tablet, Refills: 0      CONTINUE these medications which have CHANGED   Details  fluconazole (DIFLUCAN) 150 MG tablet Take 1 tablet (150 mg total) by mouth every 3 (three) days as needed (when taking other antibiotics for yeast). Qty: 10 tablet, Refills: 1    furosemide (LASIX) 20 MG tablet Take 1 tablet (20 mg total) by mouth daily. Qty: 30 tablet    potassium chloride SA (K-DUR,KLOR-CON) 20 MEQ tablet Take 1 tablet (20 mEq total) by mouth 2 (two) times daily.      CONTINUE these medications which have NOT CHANGED   Details  allopurinol (ZYLOPRIM) 100 MG tablet Take 100 mg by mouth daily.    amLODipine (NORVASC) 2.5 MG tablet Take 1 tablet (2.5 mg total) by mouth daily. Qty: 30 tablet, Refills: 3    aspirin EC 81 MG tablet Take 81 mg by mouth daily.    Cholecalciferol (VITAMIN D PO) Take 1 tablet by mouth daily.    clopidogrel (PLAVIX) 75 MG tablet Take 75 mg by mouth daily.    gemfibrozil (LOPID) 600 MG tablet Take 600 mg by mouth 2 (two) times daily before a meal.     hydrALAZINE (APRESOLINE) 100 MG tablet Take 100 mg by mouth 3 (three) times daily.    isosorbide mononitrate (IMDUR) 60 MG 24 hr tablet Take 60 mg by mouth daily.    lovastatin (MEVACOR) 40 MG tablet Take 40 mg by mouth at bedtime.    metoprolol succinate (TOPROL-XL) 50 MG 24 hr tablet Take 50 mg by mouth daily. Take with or immediately following a meal.    Multiple Vitamin tablet Take 1 tablet by mouth daily.    omeprazole (PRILOSEC) 40 MG capsule Take 40 mg by mouth daily.    PARoxetine (PAXIL) 40 MG tablet Take 40 mg by mouth at bedtime.      STOP taking these medications     pantoprazole (PROTONIX) 40 MG tablet          Brief H and P: For complete details please refer to admission H and P, but in brief *Sally Curry is a 75 y.o. female with a past medical history significant for CAD status post stent, peripheral vascular disease, advanced, sick sinus syndrome with pacer, chronic diastolic CHF, HTN, gout, CKD 3, anemia of renal disease who presents with dyspnea and flank pain. The patient was in her usual state of health until about 2 weeks ago when she developed a dry cough. This progressively became more noticeable with chest heaviness, paroxysmal nocturnal dyspnea, and difficulty climbing stairs in her  home. She lives with her daughter, and notes in the last few days she has had shortness of breath walking across the room. Superimposed upon this, in the last day she has noticed flank pain, similar to her previous kidney stones and increased urinary urgency, so she came to the ER. In the ED, the patient was hypoxic but hemodynamically stable. Her renal function was normal, she had no leukocytosis, and her lactic acid level was normal. The urinalysis showed some WBCs and bacteria and hyaline casts. A CT angiogram suggested interstitial edema, and the BNP was elevated. Furosemide was administered and TR[facility] was asked to admit for acute CHF. To admitting MD, she complained somwhat of  dyspnea, but her primary complaint was flank pain, from which she was visibly uncomfortable and anxious.  Hospital Course:  Acute on chronic diastolic CHF: Improving - Patient presented with subacute dyspnea, chest heaviness, exertional intolerance and PND. Elevated BNP and CT findings edema. Recent echo, EF 55-60% and mild-to-moderate aortic regurg. - Patient was placed on IV diuresis, creatinine however trended up to 1.8 hence Lasix held. Now creatinine improving, Lasix is transitioned to oral however patient recommended to continue to hold it for 2 days. Negative balance of 2.0 L - negative balance of 1.2L, weight 192->189lbs - Troponins negative, EKG showed no acute ischemic changes - Recent 2-D echo. EF of 55-60%, recent stress test 10/16 was low risk study - CT chest showed no pulmonary embolism - Home O2 evaluation was done and patient qualified for 1-2 L of O2 on admission.  Acute right-sided pyelonephritis, UTI - Patient was placed on IV Rocephin, CT of the abdomen was consistent with right-sided pyelonephritis. - Blood cultures negative so far, urine culture unfortunately showed only 7000 colonies. Per daughter patient had been on antibiotics prior to admission. Patient was transitioned to oral Vantin for 12 more days to complete full course for pyelonephritis. Blood cultures remained negative.  Atypical chest pain. History of CAD and HTN: Resolved -Continue Imdur, amlodipine, statin, metoprolol, gemfibrozil, and clopidogrel - Troponins negative, EKG showed no acute ischemic changes - Chest pain has completely resolved  Gout:  -Continue allopurinol  Acute kidney injury on CKD stage III:  -Likely due to diuresis, hold Lasix today  Anemia:  At baseline  Depression:  -Continue paroxetine   Day of Discharge BP 133/53 mmHg  Pulse 49  Temp(Src) 98.5 F (36.9 C) (Oral)  Resp 18  Ht 5' (1.524 m)  Wt 85.957 kg (189 lb 8 oz)  BMI 37.01 kg/m2  SpO2  94%  Physical Exam: General: Alert and awake oriented x3 not in any acute distress. HEENT: anicteric sclera, pupils reactive to light and accommodation CVS: S1-S2 clear no murmur rubs or gallops Chest: clear to auscultation bilaterally, no wheezing rales or rhonchi Abdomen: soft nontender, nondistended, normal bowel sounds Extremities: no cyanosis, clubbing or edema noted bilaterally Neuro: Cranial nerves II-XII intact, no focal neurological deficits   The results of significant diagnostics from this hospitalization (including imaging, microbiology, ancillary and laboratory) are listed below for reference.    LAB RESULTS: Basic Metabolic Panel:  Recent Labs Lab 06/06/15 0319 06/07/15 0510  NA 141 142  K 4.4 4.2  CL 111 112*  CO2 21* 18*  GLUCOSE 122* 115*  BUN 44* 47*  CREATININE 1.84* 1.74*  CALCIUM 9.1 9.4   Liver Function Tests:  Recent Labs Lab 06/04/15 0524  AST 17  ALT 11*  ALKPHOS 91  BILITOT 0.7  PROT 5.9*  ALBUMIN 3.2*   No  results for input(s): LIPASE, AMYLASE in the last 168 hours. No results for input(s): AMMONIA in the last 168 hours. CBC:  Recent Labs Lab 06/03/15 2229  06/04/15 0524 06/05/15 0321  WBC 8.1  --  6.6 6.7  NEUTROABS 5.8  --   --   --   HGB 10.5*  < > 9.9* 10.0*  HCT 33.7*  < > 31.3* 31.5*  MCV 97.7  --  98.1 97.8  PLT 200  --  193 211  < > = values in this interval not displayed. Cardiac Enzymes:  Recent Labs Lab 06/04/15 1642 06/04/15 2048  TROPONINI 0.03 0.03   BNP: Invalid input(s): POCBNP CBG:  Recent Labs Lab 06/04/15 1557 06/04/15 2044  GLUCAP 133* 91    Significant Diagnostic Studies:  Ct Angio Chest Pe W/cm &/or Wo Cm  06/04/2015  CLINICAL DATA:  Acute onset of generalized chest pain and shortness of breath. Elevated D-dimer. Initial encounter. EXAM: CT ANGIOGRAPHY CHEST WITH CONTRAST TECHNIQUE: Multidetector CT imaging of the chest was performed using the standard protocol during bolus administration of  intravenous contrast. Multiplanar CT image reconstructions and MIPs were obtained to evaluate the vascular anatomy. CONTRAST:  55mL OMNIPAQUE IOHEXOL 350 MG/ML SOLN COMPARISON:  Chest radiograph performed 06/03/2015 FINDINGS: There is no evidence of pulmonary embolus. Mild interstitial prominence is noted, with hazy bibasilar opacities, raising concern for mild interstitial edema. Trace bilateral pleural fluid is noted. There is no evidence of pneumothorax. No masses are identified; no abnormal focal contrast enhancement is seen. Diffuse coronary artery calcifications are seen. Pacemaker leads are noted ending at the right atrium and right ventricle. A left-sided pacemaker is noted. The thoracic aorta is borderline normal in caliber. No mediastinal lymphadenopathy is seen. No pericardial effusion is identified. No axillary lymphadenopathy is seen. The visualized portions of the thyroid gland are unremarkable in appearance. The visualized portions of the liver and spleen are unremarkable. There is reflux of contrast into the hepatic veins and IVC. No acute osseous abnormalities are seen. Review of the MIP images confirms the above findings. IMPRESSION: 1. No evidence of pulmonary embolus. 2. Mild interstitial prominence, with hazy bibasilar opacities, concerning for mild interstitial edema. Trace bilateral pleural fluid noted. 3. Diffuse coronary artery calcifications seen. 4. Reflux of contrast into the hepatic veins and IVC. Electronically Signed   By: Roanna Raider M.D.   On: 06/04/2015 01:47   Dg Chest Portable 1 View  06/03/2015  CLINICAL DATA:  Low back pain since yesterday, questionable fever, cough for 2 weeks, hypertension, diabetes mellitus, history MI and stroke EXAM: PORTABLE CHEST 1 VIEW COMPARISON:  Portable exam 2247 hours compared to 03/16/2015 FINDINGS: LEFT subclavian transvenous pacemaker leads project over RIGHT atrium and RIGHT ventricle, unchanged. Enlargement of cardiac silhouette with  pulmonary vascular congestion. Calcified tortuous aorta. Minimal linear atelectasis versus scarring at lingula, stable. No definite acute failure or consolidation. No pleural effusion or pneumothorax. Bones demineralized. IMPRESSION: Enlargement of cardiac silhouette with pulmonary vascular congestion. Minimal chronic atelectasis versus scarring at lingula without definite infiltrate. Electronically Signed   By: Ulyses Southward M.D.   On: 06/03/2015 23:06   Ct Renal Stone Study  06/04/2015  CLINICAL DATA:  Acute onset of upper flank pain for 2 days. Initial encounter. EXAM: CT ABDOMEN AND PELVIS WITHOUT CONTRAST TECHNIQUE: Multidetector CT imaging of the abdomen and pelvis was performed following the standard protocol without IV contrast. COMPARISON:  CT of the abdomen and pelvis from 03/16/2015 FINDINGS: Trace bilateral pleural fluid is noted. Minimal  associated atelectasis is seen. Pacemaker leads are partially imaged. The liver and spleen are unremarkable in appearance. The gallbladder is within normal limits. The pancreas and adrenal glands are unremarkable. There is diffusely increased attenuation involving the right renal collecting system, and filling the bladder. On correlation with recent urinalysis results, no blood was seen within the urine. This apparently reflects the high concentration of protein within the urine. Mild nonspecific perinephric stranding is noted bilaterally. Given the patient's symptoms and urinalysis findings, findings are suspicious for right-sided pyelonephritis. No renal or ureteral stones are identified. There is no evidence of hydronephrosis. No free fluid is identified. The small bowel is unremarkable in appearance. The stomach is within normal limits. No acute vascular abnormalities are seen. Diffuse calcification is noted along the abdominal aorta and its branches. The patient is status post appendectomy. Scattered diverticulosis is noted along the ascending, distal descending  and proximal sigmoid colon, without evidence of diverticulitis. Scattered postoperative lobules of fat and surrounding scarring are noted along the anterior abdominal wall. The bladder is mildly distended and filled with high attenuation fluid, as described above. The patient is status post hysterectomy. No suspicious adnexal masses are seen. No inguinal lymphadenopathy is seen. No acute osseous abnormalities are identified. Multilevel vacuum phenomenon is noted along the lumbar spine. IMPRESSION: 1. Diffusely increased attenuation involving the right renal collecting system, and filling the bladder. No blood was noted on recent urinalysis. This apparently reflects high concentration of protein within the urine. Given the patient's symptoms and urinalysis findings, findings are suspicious for right-sided pyelonephritis. 2. Diffuse calcification along the abdominal aorta and its branches. 3. Scattered diverticulosis along the ascending, distal descending and proximal sigmoid colon, without evidence of diverticulitis. 4. Trace bilateral pleural fluid, with minimal associated atelectasis. 5. Mild degenerative change noted along the lumbar spine. Electronically Signed   By: Roanna Raider M.D.   On: 06/04/2015 03:47    2D ECHO:   Disposition and Follow-up: Discharge Instructions    Diet - low sodium heart healthy    Complete by:  As directed      Discharge instructions    Complete by:  As directed   Please HOLD lasix and potassium for 2 days, restart on 06/09/15     Increase activity slowly    Complete by:  As directed             DISPOSITION: home    DISCHARGE FOLLOW-UP Follow-up Information    Follow up with Clide Dales, FNP. Schedule an appointment as soon as possible for a visit in 10 days.   Specialty:  Nurse Practitioner   Why:  for hospital follow-up, obtain labs, BMET for kidney function   Contact information:   [address] [place] [address] [phone]         Time spent on Discharge:   Signed:   Jacksen Isip M.D. [facility] 06/07/2015, 12:28 PM Pager: [phone]

## 2015-06-07 NOTE — Progress Notes (Signed)
Reviewed all discharge instructions with patient and her daughter, including prescriptions, f/u appointments, home health, and home 02.  Patient took 02 tank delivered to her room to take home.No voiced complaints.

## 2015-06-07 NOTE — Care Management Note (Addendum)
Case Management Note  Patient Details  Name: Nash DimmerDawn Batalla MRN: [id number] Date of Birth: [birth year]  Subjective/Objective:      Pt will need home health RN and PT services, does not want OT or aide.  Provided list of home health agencies and referral made to [facility] per choice.  Will also d/c home with O2, [facility] will provide tank for transport.                    Expected Discharge Plan:  Home w Home Health Services  Discharge planning Services  CM Consult  Post Acute Care Choice:  Durable Medical Equipment, Home Health Choice offered to:  Patient  DME Arranged:  Oxygen DME Agency:  [facility]  HH Arranged:  [facility]  [facility]  Status of Service:  Completed, signed off  Medicare Important Message Given:  Yes  Magdalene RiverMayo, Aedyn Kempfer T, RN 06/07/2015, 1:30 PM  06/07/15 3:38 PM:  Dtr here to transport pt, she states that [facility]  Orders and discharge summary faxed to [facility].  Notified [facility].

## 2015-06-07 NOTE — Progress Notes (Signed)
SATURATION QUALIFICATIONS: (This note is used to comply with regulatory documentation for home oxygen)  Patient Saturations on Room Air at Rest = 94%  Patient Saturations on Room Air while Ambulating = 85%  Patient Saturations on 1 Liters of oxygen while Ambulating = 90%  Please briefly explain why patient needs home oxygen:chf

## 2015-06-07 NOTE — Care Management Important Message (Signed)
Important Message  Patient Details  Name: Sally Curry MRN: [id number] Date of Birth: Sep 03, 1940   Medicare Important Message Given:  Yes    Rayvon CharSTUTTS, Jayveion Stalling G 06/07/2015, 1:16 PMImportant Message  Patient Details  Name: Sally Curry MRN: [id number] Date of Birth: Sep 03, 1940   Medicare Important Message Given:  Yes    Breelynn Bankert G 06/07/2015, 1:16 PM

## 2015-06-09 LAB — CULTURE, BLOOD (ROUTINE X 2)
Culture: NO GROWTH
Culture: NO GROWTH

## 2015-08-16 ENCOUNTER — Emergency Department (HOSPITAL_COMMUNITY)
Admission: EM | Admit: 2015-08-16 | Discharge: 2015-08-17 | Disposition: A | Discharge status: Other Acute Inpatient Hospital | Payer: Medicare Other | Attending: Emergency Medicine | Admitting: Emergency Medicine

## 2015-08-16 ENCOUNTER — Encounter (HOSPITAL_COMMUNITY): Payer: Self-pay | Admitting: Emergency Medicine

## 2015-08-16 ENCOUNTER — Emergency Department (HOSPITAL_COMMUNITY): Payer: Medicare Other

## 2015-08-16 DIAGNOSIS — I2510 Atherosclerotic heart disease of native coronary artery without angina pectoris: Secondary | ICD-10-CM | POA: Diagnosis not present

## 2015-08-16 DIAGNOSIS — I509 Heart failure, unspecified: Secondary | ICD-10-CM | POA: Insufficient documentation

## 2015-08-16 DIAGNOSIS — R109 Unspecified abdominal pain: Secondary | ICD-10-CM

## 2015-08-16 DIAGNOSIS — R140 Abdominal distension (gaseous): Secondary | ICD-10-CM | POA: Insufficient documentation

## 2015-08-16 DIAGNOSIS — Z79899 Other long term (current) drug therapy: Secondary | ICD-10-CM | POA: Insufficient documentation

## 2015-08-16 DIAGNOSIS — Z8669 Personal history of other diseases of the nervous system and sense organs: Secondary | ICD-10-CM | POA: Insufficient documentation

## 2015-08-16 DIAGNOSIS — N189 Chronic kidney disease, unspecified: Secondary | ICD-10-CM | POA: Diagnosis not present

## 2015-08-16 DIAGNOSIS — F329 Major depressive disorder, single episode, unspecified: Secondary | ICD-10-CM | POA: Diagnosis not present

## 2015-08-16 DIAGNOSIS — N390 Urinary tract infection, site not specified: Secondary | ICD-10-CM | POA: Diagnosis not present

## 2015-08-16 DIAGNOSIS — R05 Cough: Secondary | ICD-10-CM | POA: Diagnosis not present

## 2015-08-16 DIAGNOSIS — M109 Gout, unspecified: Secondary | ICD-10-CM | POA: Insufficient documentation

## 2015-08-16 DIAGNOSIS — R0602 Shortness of breath: Secondary | ICD-10-CM | POA: Insufficient documentation

## 2015-08-16 DIAGNOSIS — IMO0002 Reserved for concepts with insufficient information to code with codable children: Secondary | ICD-10-CM

## 2015-08-16 DIAGNOSIS — Z950 Presence of cardiac pacemaker: Secondary | ICD-10-CM | POA: Diagnosis not present

## 2015-08-16 DIAGNOSIS — R0989 Other specified symptoms and signs involving the circulatory and respiratory systems: Secondary | ICD-10-CM | POA: Diagnosis not present

## 2015-08-16 DIAGNOSIS — Z7901 Long term (current) use of anticoagulants: Secondary | ICD-10-CM | POA: Insufficient documentation

## 2015-08-16 DIAGNOSIS — R5383 Other fatigue: Secondary | ICD-10-CM | POA: Diagnosis present

## 2015-08-16 DIAGNOSIS — Z9861 Coronary angioplasty status: Secondary | ICD-10-CM | POA: Insufficient documentation

## 2015-08-16 DIAGNOSIS — L02211 Cutaneous abscess of abdominal wall: Secondary | ICD-10-CM | POA: Insufficient documentation

## 2015-08-16 DIAGNOSIS — Z862 Personal history of diseases of the blood and blood-forming organs and certain disorders involving the immune mechanism: Secondary | ICD-10-CM | POA: Insufficient documentation

## 2015-08-16 DIAGNOSIS — F419 Anxiety disorder, unspecified: Secondary | ICD-10-CM | POA: Diagnosis not present

## 2015-08-16 DIAGNOSIS — I129 Hypertensive chronic kidney disease with stage 1 through stage 4 chronic kidney disease, or unspecified chronic kidney disease: Secondary | ICD-10-CM | POA: Insufficient documentation

## 2015-08-16 DIAGNOSIS — Z8673 Personal history of transient ischemic attack (TIA), and cerebral infarction without residual deficits: Secondary | ICD-10-CM | POA: Diagnosis not present

## 2015-08-16 DIAGNOSIS — Z7982 Long term (current) use of aspirin: Secondary | ICD-10-CM | POA: Insufficient documentation

## 2015-08-16 HISTORY — DX: Disorder of kidney and ureter, unspecified: N28.9

## 2015-08-16 LAB — BASIC METABOLIC PANEL
Anion gap: 12 (ref 5–15)
BUN: 27 mg/dL — AB (ref 6–20)
CHLORIDE: 110 mmol/L (ref 101–111)
CO2: 20 mmol/L — AB (ref 22–32)
CREATININE: 1.35 mg/dL — AB (ref 0.44–1.00)
Calcium: 9.5 mg/dL (ref 8.9–10.3)
GFR calc Af Amer: 44 mL/min — ABNORMAL LOW (ref >60)
GFR calc non Af Amer: 38 mL/min — ABNORMAL LOW (ref >60)
GLUCOSE: 87 mg/dL (ref 65–99)
POTASSIUM: 4 mmol/L (ref 3.5–5.1)
SODIUM: 142 mmol/L (ref 135–145)

## 2015-08-16 LAB — URINE MICROSCOPIC-ADD ON: RBC / HPF: NONE SEEN RBC/hpf (ref 0–5)

## 2015-08-16 LAB — CBC WITH DIFFERENTIAL/PLATELET
BASOS ABS: 0.1 10*3/uL (ref 0.0–0.1)
Basophils Relative: 1 %
Eosinophils Absolute: 0.1 10*3/uL (ref 0.0–0.7)
Eosinophils Relative: 1 %
HEMATOCRIT: 31.2 % — AB (ref 36.0–46.0)
Hemoglobin: 10.1 g/dL — ABNORMAL LOW (ref 12.0–15.0)
LYMPHS ABS: 1.2 10*3/uL (ref 0.7–4.0)
LYMPHS PCT: 19 %
MCH: 29.4 pg (ref 26.0–34.0)
MCHC: 32.4 g/dL (ref 30.0–36.0)
MCV: 91 fL (ref 78.0–100.0)
MONOS PCT: 16 %
Monocytes Absolute: 1 10*3/uL (ref 0.1–1.0)
Neutro Abs: 4 10*3/uL (ref 1.7–7.7)
Neutrophils Relative %: 63 %
Platelets: 307 10*3/uL (ref 150–400)
RBC: 3.43 MIL/uL — AB (ref 3.87–5.11)
RDW: 16 % — AB (ref 11.5–15.5)
WBC: 6.4 10*3/uL (ref 4.0–10.5)

## 2015-08-16 LAB — URINALYSIS, ROUTINE W REFLEX MICROSCOPIC
Bilirubin Urine: NEGATIVE
GLUCOSE, UA: NEGATIVE mg/dL
HGB URINE DIPSTICK: NEGATIVE
KETONES UR: NEGATIVE mg/dL
Nitrite: NEGATIVE
PROTEIN: 30 mg/dL — AB
Specific Gravity, Urine: 1.01 (ref 1.005–1.030)
pH: 5.5 (ref 5.0–8.0)

## 2015-08-16 LAB — INFLUENZA PANEL BY PCR (TYPE A & B)
H1N1FLUPCR: NOT DETECTED
INFLBPCR: NEGATIVE
Influenza A By PCR: POSITIVE — AB

## 2015-08-16 LAB — I-STAT CG4 LACTIC ACID, ED
LACTIC ACID, VENOUS: 0.95 mmol/L (ref 0.5–2.0)
Lactic Acid, Venous: 0.6 mmol/L (ref 0.5–2.0)

## 2015-08-16 LAB — BRAIN NATRIURETIC PEPTIDE: B Natriuretic Peptide: 334.3 pg/mL — ABNORMAL HIGH (ref 0.0–100.0)

## 2015-08-16 LAB — LACTIC ACID, PLASMA: Lactic Acid, Venous: 1.3 mmol/L (ref 0.5–2.0)

## 2015-08-16 MED ORDER — PIPERACILLIN-TAZOBACTAM 3.375 G IVPB 30 MIN
3.3750 g | Freq: Once | INTRAVENOUS | Status: AC
  Administered 2015-08-16: 3.375 g via INTRAVENOUS
  Filled 2015-08-16: qty 50

## 2015-08-16 MED ORDER — IOHEXOL 300 MG/ML  SOLN
80.0000 mL | Freq: Once | INTRAMUSCULAR | Status: AC | PRN
  Administered 2015-08-16: 80 mL via INTRAVENOUS

## 2015-08-16 MED ORDER — SODIUM CHLORIDE 0.9 % IV BOLUS (SEPSIS)
1000.0000 mL | Freq: Once | INTRAVENOUS | Status: AC
  Administered 2015-08-16: 1000 mL via INTRAVENOUS

## 2015-08-16 MED ORDER — VANCOMYCIN HCL 10 G IV SOLR
1750.0000 mg | Freq: Once | INTRAVENOUS | Status: AC
  Administered 2015-08-16: 1750 mg via INTRAVENOUS
  Filled 2015-08-16: qty 1750

## 2015-08-16 MED ORDER — PIPERACILLIN-TAZOBACTAM 3.375 G IVPB: 3.3750 g | Freq: Three times a day (TID) | INTRAVENOUS | Status: DC

## 2015-08-16 MED ORDER — ACETAMINOPHEN 325 MG PO TABS
650.0000 mg | ORAL_TABLET | Freq: Once | ORAL | Status: AC
Start: 2015-08-16 — End: 2015-08-16
  Administered 2015-08-16: 650 mg via ORAL
  Filled 2015-08-16: qty 2

## 2015-08-16 NOTE — Consult Note (Signed)
Pharmacy Antibiotic Note  Sally Curry is a 75 y.o. female admitted on 08/16/2015 with possible UTI vs. abdominal infection.  Pharmacy has been consulted for zosyn dosing. BMET pending.  Plan: 1) Zosyn 3.375g IV q8 (4 hour infusion) 2) Follow renal function, cultures, LOT  Weight: 189 lb 9.5 oz (86 kg)  Temp (24hrs), Avg:100 F (37.8 C), Min:98.2 F (36.8 C), Max:101.7 F (38.7 C)   Recent Labs Lab 08/16/15 1710  WBC 6.4    CrCl cannot be calculated (Patient has no serum creatinine result on file.).    No Known Allergies  Antimicrobials this admission: 3/20 Zosyn>>  Dose adjustments this admission: n/a  Microbiology results: 3/20 urine>>  Thank you for allowing pharmacy to be a part of this patient's care.  Fredrik RiggerMarkle, Sally Curry 08/16/2015 6:33 PM

## 2015-08-16 NOTE — ED Notes (Signed)
Sally Curry Charity fundraiserN attmpted IV

## 2015-08-16 NOTE — ED Provider Notes (Signed)
CSN: [id number]     Arrival date & time 08/16/15  1619 History   First MD Initiated Contact with Patient 08/16/15 1626     Chief Complaint  Patient presents with  . Influenza  . Cough  . Nasal Congestion     (Consider location/radiation/quality/duration/timing/severity/associated sxs/prior Treatment) Patient is a 75 y.o. female presenting with general illness. The history is provided by the patient.  Illness Location:  Diffuse Quality:  Myalgias, cough, runny nose, fatigue, fluid leaking from abdominal incision Severity:  Severe Onset quality:  Gradual Duration:  2 days Timing:  Constant Progression:  Worsening Chronicity:  New Context:  Patient with recent abdominal surgery at [facility] 1 month ago for Ventral Hernia. Also hx of pyelonephritis requiring admission here Relieved by:  Nothing Worsened by:  Nothing tried Ineffective treatments:  Nothing Associated symptoms: abdominal pain, cough, fatigue, myalgias and shortness of breath   Associated symptoms: no chest pain, no diarrhea, no fever, no headaches, no loss of consciousness, no nausea, no rash, no rhinorrhea, no sore throat, no vomiting and no wheezing     Past Medical History  Diagnosis Date  . CAD (coronary artery disease)   . Hypertension   . Stroke (HCC)   . Gout   . PVD (peripheral vascular disease) (HCC)   . Anemia   . Chronic kidney disease   . Anxiety   . CHF (congestive heart failure) (HCC)   . Depression   . Glaucoma   . Presence of permanent cardiac pacemaker   . Renal insufficiency    Past Surgical History  Procedure Laterality Date  . Coronary angioplasty with stent placement    . Pacemaker insertion    . Cholecystectomy    . Carotid endarterectomy    . Percutaneous placement intravascular stent cervical carotid artery    . Appendectomy     Family History  Problem Relation Age of Onset  . Hypertension Mother   . Parkinson's disease Mother   . Emphysema Father   . Diabetes Sister    Social  History  Substance Use Topics  . Smoking status: Never Smoker   . Smokeless tobacco: None  . Alcohol Use: No   OB History    No data available     Review of Systems  Constitutional: Positive for fatigue. Negative for fever, chills, diaphoresis, activity change and appetite change.  HENT: Negative for facial swelling, rhinorrhea, sore throat, trouble swallowing and voice change.   Eyes: Negative for photophobia, pain and visual disturbance.  Respiratory: Positive for cough and shortness of breath. Negative for wheezing and stridor.   Cardiovascular: Negative for chest pain, palpitations and leg swelling.  Gastrointestinal: Positive for abdominal pain and abdominal distention. Negative for nausea, vomiting, diarrhea, constipation and anal bleeding.  Endocrine: Negative.   Genitourinary: Negative for dysuria, vaginal bleeding, vaginal discharge and vaginal pain.  Musculoskeletal: Positive for myalgias. Negative for back pain and arthralgias.  Skin: Negative.  Negative for rash.  Allergic/Immunologic: Negative.   Neurological: Negative for dizziness, tremors, loss of consciousness, syncope, weakness and headaches.  Psychiatric/Behavioral: Negative for suicidal ideas, sleep disturbance and self-injury.  All other systems reviewed and are negative.     Allergies  Review of patient's allergies indicates no known allergies.  Home Medications   Prior to Admission medications   Medication Sig Start Date End Date Taking? Authorizing Provider  allopurinol (ZYLOPRIM) 100 MG tablet Take 100 mg by mouth daily.   Yes Historical Provider, MD  amLODipine (NORVASC) 2.5 MG tablet Take 1  tablet (2.5 mg total) by mouth daily. 03/19/15  Yes Leotis Shames, MD  aspirin EC 81 MG tablet Take 81 mg by mouth daily.   Yes Historical Provider, MD  Cholecalciferol (VITAMIN D PO) Take 1 tablet by mouth daily.   Yes Historical Provider, MD  clopidogrel (PLAVIX) 75 MG tablet Take 75 mg by mouth daily.   Yes  Historical Provider, MD  furosemide (LASIX) 20 MG tablet Take 1 tablet (20 mg total) by mouth daily. 06/09/15  Yes Ripudeep Jenna Luo, MD  gemfibrozil (LOPID) 600 MG tablet Take 600 mg by mouth 2 (two) times daily before a meal.   Yes Historical Provider, MD  hydrALAZINE (APRESOLINE) 100 MG tablet Take 100 mg by mouth 3 (three) times daily. 05/14/15  Yes Historical Provider, MD  isosorbide mononitrate (IMDUR) 60 MG 24 hr tablet Take 60 mg by mouth daily.   Yes Historical Provider, MD  lovastatin (MEVACOR) 40 MG tablet Take 40 mg by mouth at bedtime.   Yes Historical Provider, MD  metoprolol succinate (TOPROL-XL) 50 MG 24 hr tablet Take 50 mg by mouth daily. Take with or immediately following a meal.   Yes Historical Provider, MD  Multiple Vitamin tablet Take 1 tablet by mouth daily.   Yes Historical Provider, MD  omeprazole (PRILOSEC) 40 MG capsule Take 40 mg by mouth daily. 05/26/15  Yes Historical Provider, MD  PARoxetine (PAXIL) 40 MG tablet Take 40 mg by mouth at bedtime. 05/14/15  Yes Historical Provider, MD  potassium chloride SA (K-DUR,KLOR-CON) 20 MEQ tablet Take 1 tablet (20 mEq total) by mouth 2 (two) times daily. 06/09/15  Yes Ripudeep K Rai, MD   BP 188/73 mmHg  Pulse 83  Temp(Src) 101.7 F (38.7 C) (Rectal)  Resp 27  Wt 86 kg  SpO2 98% Physical Exam  Constitutional: She appears well-developed and well-nourished. No distress.  HENT:  Head: Normocephalic and atraumatic.  Right Ear: External ear normal.  Left Ear: External ear normal.  Mouth/Throat: Oropharynx is clear and moist. No oropharyngeal exudate.  Eyes: Conjunctivae and EOM are normal. Pupils are equal, round, and reactive to light. No scleral icterus.  Neck: Normal range of motion. Neck supple. No JVD present. No tracheal deviation present. No thyromegaly present.  Cardiovascular: Normal rate, regular rhythm and intact distal pulses.  Exam reveals no gallop and no friction rub.   No murmur heard. Pulmonary/Chest: No  respiratory distress. She has no wheezes. She has no rales.  Tachypnic to mid 20's. Not in any frank respiratory distress  Abdominal: Soft. Bowel sounds are normal. She exhibits distension. There is tenderness.  Bilateral lower abdominal incisions, right incision with packing in place. No active drainage of pus or blood. Abdomen is distended, warm, and mildly erythematous.   Musculoskeletal: Normal range of motion. She exhibits no edema or tenderness.  Neurological: She is alert. No cranial nerve deficit. She exhibits normal muscle tone. Coordination normal.  oriented to persona and place but not to year. Incorrectly identifies president of the [place] as Obama  Skin: Skin is warm and dry. She is not diaphoretic. No pallor.  Psychiatric: She has a normal mood and affect. She expresses no homicidal and no suicidal ideation. She expresses no suicidal plans and no homicidal plans.  Nursing note and vitals reviewed.   ED Course  Procedures (including critical care time) Labs Review Labs Reviewed  CBC WITH DIFFERENTIAL/PLATELET - Abnormal; Notable for the following:    RBC 3.43 (*)    Hemoglobin 10.1 (*)  HCT 31.2 (*)    RDW 16.0 (*)    All other components within normal limits  INFLUENZA PANEL BY PCR (TYPE A & B, H1N1) - Abnormal; Notable for the following:    Influenza A By PCR POSITIVE (*)    All other components within normal limits  URINALYSIS, ROUTINE W REFLEX MICROSCOPIC (NOT AT [facility]) - Abnormal; Notable for the following:    APPearance CLOUDY (*)    Protein, ur 30 (*)    Leukocytes, UA TRACE (*)    All other components within normal limits  URINE MICROSCOPIC-ADD ON - Abnormal; Notable for the following:    Squamous Epithelial / LPF 0-5 (*)    Bacteria, UA MANY (*)    All other components within normal limits  BRAIN NATRIURETIC PEPTIDE - Abnormal; Notable for the following:    B Natriuretic Peptide 334.3 (*)    All other components within normal limits  BASIC METABOLIC  PANEL - Abnormal; Notable for the following:    CO2 20 (*)    BUN 27 (*)    Creatinine, Ser 1.35 (*)    GFR calc non Af Amer 38 (*)    GFR calc Af Amer 44 (*)    All other components within normal limits  URINE CULTURE  CULTURE, BLOOD (ROUTINE X 2)  CULTURE, BLOOD (ROUTINE X 2)  LACTIC ACID, PLASMA  I-STAT CG4 LACTIC ACID, ED  I-STAT CG4 LACTIC ACID, ED    Imaging Review Dg Chest 2 View  08/16/2015  CLINICAL DATA:  Dry cough, fatigue and abdominal pain for 2 days. History of pacemaker, diabetes and hypertension. EXAM: CHEST  2 VIEW COMPARISON:  Radiographs 06/03/2015.  CT 06/04/2015. FINDINGS: 1707 hours. Left subclavian pacemaker leads appear unchanged within the right atrial appendage and right ventricle. The heart is enlarged. A coronary artery stent is noted. The lungs are clear. There is no pleural effusion or pneumothorax. There is an old fracture of the proximal right humerus. No acute osseous findings are seen. IMPRESSION: No acute cardiopulmonary process.  Stable cardiomegaly. Electronically Signed   By: Carey Bullocks M.D.   On: 08/16/2015 17:40   Ct Abdomen Pelvis W Contrast  08/16/2015  CLINICAL DATA:  Abdominal pain and nausea. EXAM: CT ABDOMEN AND PELVIS WITH CONTRAST TECHNIQUE: Multidetector CT imaging of the abdomen and pelvis was performed using the standard protocol following bolus administration of intravenous contrast. CONTRAST:  80mL OMNIPAQUE IOHEXOL 300 MG/ML  SOLN COMPARISON:  06/04/2015 FINDINGS: The lung bases are clear. Mild cardiac enlargement. Small lymph nodes in the right cardiophrenic angle, likely reactive. The gallbladder is distended without wall thickening or infiltration. No radiopaque stones are demonstrated. Spleen is increased in size. Liver demonstrates a somewhat nodular contour with enlargement of the lateral segment left lobe suggesting possible cirrhosis. Portal veins are patent. The pancreas, adrenal glands, inferior vena cava, and retroperitoneal  lymph nodes are unremarkable. Small cysts in the kidneys. Parenchymal atrophy is suggested on the left. No hydronephrosis. Calcification of the abdominal aorta and branch vessels without aneurysm. Stomach is decompressed. Small bowel and colon are not abnormally distended. Scattered stool in the colon. Postoperative changes in the anterior abdominal wall with multiple surgical clips present. There is diffuse infiltration in the subcutaneous fat consistent with edema or cellulitis. There is a fluid collection along the anterior low pelvic region within the subcutaneous fat. The collection measures about 18 by 1.6 cm. There is thickening and enhancement throughout the rectus abdominus muscles. Changes likely to represent cellulitis. The fluid  collection could be due to abscess or postoperative collection. Mild edema in the mesenteric associated with a surgical clip. This could also be postoperative or may indicate fat necrosis, infection or inflammation. No free air in the abdomen. Pelvis: Mild diffuse bladder wall thickening suggesting possible cystitis. Uterus is surgically absent. No free or loculated pelvic fluid collections. No pelvic mass or lymphadenopathy. Appendix is not identified. IMPRESSION: 1. Postoperative changes in the anterior abdominal wall with a large fluid collection and infiltration throughout the subcutaneous fat suggesting cellulitis and abscess or postoperative collection. Mild infiltration in the mesenteric may represent fat necrosis or inflammatory change. 2. Distended gallbladder without inflammatory change. No radiopaque stones. 3. 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 4. Wall thickening of the bladder may indicate cystitis. Electronically Signed   By: Burman Nieves M.D.   On: 08/16/2015 22:10   I have personally reviewed and evaluated these images and lab results as part of my medical decision-making.   EKG Interpretation None      MDM   Final diagnoses:   Abdominal pain  UTI (lower urinary tract infection)  Abdominal abscess [facility])    The patient is a 75 year old female with a compensated past medical history including stroke, CAD, CHF and recent abdominal surgery 1 month ago at [facility]. She presents with 2 days of generalized symptoms of cough, nasal congestion, fever, and confusion. On arrival the patient is febrile to 101.7 rectally. She is noted to be tachypnic and has a tender abdomen with warmth and erythema to lower abdomen. No active pus drainage from packed surgical wound. Urine concerning for infection. Cultures drawn and patient started on broad-spectrum antibiotics. CT abdomen and pelvis concerning for fluid collection which is concerning for abscess given fever and recent instrumentation. Patient transferred to [facility] center for further evaluation and treatment. Daughter and POA, Asher Muir, expresses understanding and agreement with this plan.   Patient seen with attending, Dr. Fayrene Fearing, who oversaw clinical decision making.     Lula Olszewski, MD 08/16/15 6213  Rolland Porter, MD 08/16/15 [phone]

## 2015-08-16 NOTE — ED Notes (Signed)
Patient comes from home states she has been fatigue with a cough, runny nose with "leaking fluids from the surgical site." States saw Surgeon advised was not infected. Patient denies SOB, CP, N/V. Vitals with EMS 130/70 HR78 CBG91 RR20.

## 2015-08-16 NOTE — ED Notes (Signed)
Pt's wound covering came off. Replaced it with an abdominal pad and soft surgical tape. Informed Monique - RN.

## 2015-08-16 NOTE — ED Notes (Signed)
Resident to start IV.

## 2015-08-16 NOTE — ED Notes (Signed)
RN attempted IV X2 Phlebotomy to collect blood.

## 2015-08-16 NOTE — ED Notes (Signed)
Patient transported to CT 

## 2015-08-16 NOTE — ED Notes (Signed)
Blood cultures ordered after antibiotic started; Antibiotic paused after about 15-20 minutes for blood cultures to be collected; Phlebotomy at bedside

## 2015-08-16 NOTE — ED Notes (Signed)
CMP still needed.

## 2015-08-16 NOTE — ED Notes (Addendum)
Password set up: Saundra ShellingJamie  Jamie Lee is daughter, contact number is [phone] [name] is POA and medical POA.

## 2015-08-18 LAB — URINE CULTURE

## 2015-08-19 ENCOUNTER — Telehealth (HOSPITAL_BASED_OUTPATIENT_CLINIC_OR_DEPARTMENT_OTHER): Payer: Self-pay | Admitting: Emergency Medicine

## 2015-08-19 NOTE — Progress Notes (Signed)
ED Antimicrobial Stewardship Positive Culture Follow Up   Sally Curry is an 75 y.o. female who [facility] with a chief complaint of  Chief Complaint  Patient presents with  . Influenza  . Cough  . Nasal Congestion    Recent Results (from the past 720 hour(s))  Urine culture     Status: None   Collection Time: 08/16/15  4:35 PM  Result Value Ref Range Status   Specimen Description URINE, CLEAN CATCH  Final   Special Requests NONE  Final   Culture >=100,000 COLONIES/mL ESCHERICHIA COLI  Final   Report Status 08/18/2015 FINAL  Final   Organism ID, Bacteria ESCHERICHIA COLI  Final      Susceptibility   Escherichia coli - MIC*    AMPICILLIN >=32 RESISTANT Resistant     CEFAZOLIN <=4 SENSITIVE Sensitive     CEFTRIAXONE <=1 SENSITIVE Sensitive     CIPROFLOXACIN >=4 RESISTANT Resistant     GENTAMICIN <=1 SENSITIVE Sensitive     IMIPENEM <=0.25 SENSITIVE Sensitive     NITROFURANTOIN <=16 SENSITIVE Sensitive     TRIMETH/SULFA >=320 RESISTANT Resistant     AMPICILLIN/SULBACTAM 16 INTERMEDIATE Intermediate     PIP/TAZO <=4 SENSITIVE Sensitive     * >=100,000 COLONIES/mL ESCHERICHIA COLI  Blood culture (routine x 2)     Status: None (Preliminary result)   Collection Time: 08/16/15  8:34 PM  Result Value Ref Range Status   Specimen Description BLOOD LEFT ANTECUBITAL  Final   Special Requests BOTTLES DRAWN AEROBIC AND ANAEROBIC 5CC  Final   Culture NO GROWTH 2 DAYS  Final   Report Status PENDING  Incomplete  Blood culture (routine x 2)     Status: None (Preliminary result)   Collection Time: 08/16/15  8:45 PM  Result Value Ref Range Status   Specimen Description BLOOD LEFT HAND  Final   Special Requests BOTTLES DRAWN AEROBIC ONLY 8CC  Final   Culture NO GROWTH 2 DAYS  Final   Report Status PENDING  Incomplete    [x]  Patient discharged originally without antimicrobial agent and treatment is now indicated  New antibiotic prescription: [facility]  [facility] after being started on Zosyn and transferred from our ED. Will make sure they have seen this culture.  ED Provider: Elpidio AnisShari Upstill, PA-C   Markel Mergenthaler A Aubrielle Stroud 08/19/2015, 10:04 AM Infectious Diseases Pharmacist Phone# [phone]

## 2015-08-19 NOTE — Telephone Encounter (Signed)
Post ED Visit - Positive Culture Follow-up  Culture report reviewed by antimicrobial stewardship pharmacist:  []  Enzo BiNathan Batchelder, Pharm.D. []  Celedonio MiyamotoJeremy Frens, Pharm.D., BCPS []  Garvin FilaMike Maccia, Pharm.D. []  Georgina PillionElizabeth Martin, Pharm.D., BCPS []  PeckMinh Pham, [address]Pharm.D., BCPS, AAHIVP []  Estella HuskMichelle Turner, Pharm.D., BCPS, AAHIVP []  Tennis Mustassie Stewart, Pharm.D. []  Sherle Poeob Vincent, [place]Oliver Pila. Meagan Decker PharmD  Positive urine culture E. coli [facility], [facility]ed [facility], [phone] [facility]elle RN  Berle MullMiller, Rosaleen Mazer 08/19/2015, 3:24 PM

## 2015-08-21 LAB — CULTURE, BLOOD (ROUTINE X 2)
Culture: NO GROWTH
Culture: NO GROWTH

## 2015-09-22 ENCOUNTER — Emergency Department: Payer: Medicare Other

## 2015-09-22 ENCOUNTER — Encounter: Payer: Self-pay | Admitting: *Deleted

## 2015-09-22 ENCOUNTER — Inpatient Hospital Stay
Admission: EM | Admit: 2015-09-22 | Discharge: 2015-09-25 | DRG: 291 | Disposition: A | Discharge status: Home Health | Payer: Medicare Other | Attending: Internal Medicine | Admitting: Internal Medicine

## 2015-09-22 DIAGNOSIS — Z950 Presence of cardiac pacemaker: Secondary | ICD-10-CM | POA: Diagnosis not present

## 2015-09-22 DIAGNOSIS — I509 Heart failure, unspecified: Secondary | ICD-10-CM

## 2015-09-22 DIAGNOSIS — I130 Hypertensive heart and chronic kidney disease with heart failure and stage 1 through stage 4 chronic kidney disease, or unspecified chronic kidney disease: Principal | ICD-10-CM | POA: Diagnosis present

## 2015-09-22 DIAGNOSIS — B368 Other specified superficial mycoses: Secondary | ICD-10-CM | POA: Diagnosis present

## 2015-09-22 DIAGNOSIS — I080 Rheumatic disorders of both mitral and aortic valves: Secondary | ICD-10-CM | POA: Diagnosis present

## 2015-09-22 DIAGNOSIS — I5033 Acute on chronic diastolic (congestive) heart failure: Secondary | ICD-10-CM | POA: Diagnosis present

## 2015-09-22 DIAGNOSIS — Z7902 Long term (current) use of antithrombotics/antiplatelets: Secondary | ICD-10-CM

## 2015-09-22 DIAGNOSIS — I2510 Atherosclerotic heart disease of native coronary artery without angina pectoris: Secondary | ICD-10-CM | POA: Diagnosis present

## 2015-09-22 DIAGNOSIS — E785 Hyperlipidemia, unspecified: Secondary | ICD-10-CM | POA: Diagnosis present

## 2015-09-22 DIAGNOSIS — Z7982 Long term (current) use of aspirin: Secondary | ICD-10-CM | POA: Diagnosis not present

## 2015-09-22 DIAGNOSIS — Z8673 Personal history of transient ischemic attack (TIA), and cerebral infarction without residual deficits: Secondary | ICD-10-CM

## 2015-09-22 DIAGNOSIS — Z820 Family history of epilepsy and other diseases of the nervous system: Secondary | ICD-10-CM

## 2015-09-22 DIAGNOSIS — F329 Major depressive disorder, single episode, unspecified: Secondary | ICD-10-CM | POA: Diagnosis present

## 2015-09-22 DIAGNOSIS — I739 Peripheral vascular disease, unspecified: Secondary | ICD-10-CM | POA: Diagnosis present

## 2015-09-22 DIAGNOSIS — N182 Chronic kidney disease, stage 2 (mild): Secondary | ICD-10-CM | POA: Diagnosis present

## 2015-09-22 DIAGNOSIS — I5023 Acute on chronic systolic (congestive) heart failure: Secondary | ICD-10-CM | POA: Diagnosis not present

## 2015-09-22 DIAGNOSIS — H409 Unspecified glaucoma: Secondary | ICD-10-CM | POA: Diagnosis present

## 2015-09-22 DIAGNOSIS — Z79899 Other long term (current) drug therapy: Secondary | ICD-10-CM

## 2015-09-22 DIAGNOSIS — Z833 Family history of diabetes mellitus: Secondary | ICD-10-CM

## 2015-09-22 DIAGNOSIS — M109 Gout, unspecified: Secondary | ICD-10-CM | POA: Diagnosis present

## 2015-09-22 DIAGNOSIS — Z8249 Family history of ischemic heart disease and other diseases of the circulatory system: Secondary | ICD-10-CM | POA: Diagnosis not present

## 2015-09-22 DIAGNOSIS — Z825 Family history of asthma and other chronic lower respiratory diseases: Secondary | ICD-10-CM

## 2015-09-22 DIAGNOSIS — D6489 Other specified anemias: Secondary | ICD-10-CM | POA: Diagnosis present

## 2015-09-22 DIAGNOSIS — Z955 Presence of coronary angioplasty implant and graft: Secondary | ICD-10-CM

## 2015-09-22 DIAGNOSIS — D638 Anemia in other chronic diseases classified elsewhere: Secondary | ICD-10-CM | POA: Diagnosis present

## 2015-09-22 LAB — URINALYSIS COMPLETE WITH MICROSCOPIC (ARMC ONLY)
BACTERIA UA: NONE SEEN
Bilirubin Urine: NEGATIVE
GLUCOSE, UA: NEGATIVE mg/dL
HGB URINE DIPSTICK: NEGATIVE
Ketones, ur: NEGATIVE mg/dL
Leukocytes, UA: NEGATIVE
Nitrite: NEGATIVE
PROTEIN: 30 mg/dL — AB
RBC / HPF: NONE SEEN RBC/hpf (ref 0–5)
SPECIFIC GRAVITY, URINE: 1.006 (ref 1.005–1.030)
pH: 6 (ref 5.0–8.0)

## 2015-09-22 LAB — BRAIN NATRIURETIC PEPTIDE: B NATRIURETIC PEPTIDE 5: 722 pg/mL — AB (ref 0.0–100.0)

## 2015-09-22 LAB — BASIC METABOLIC PANEL
Anion gap: 12 (ref 5–15)
BUN: 24 mg/dL — AB (ref 6–20)
CHLORIDE: 110 mmol/L (ref 101–111)
CO2: 21 mmol/L — AB (ref 22–32)
CREATININE: 1.11 mg/dL — AB (ref 0.44–1.00)
Calcium: 10 mg/dL (ref 8.9–10.3)
GFR calc non Af Amer: 48 mL/min — ABNORMAL LOW (ref >60)
GFR, EST AFRICAN AMERICAN: 56 mL/min — AB (ref >60)
GLUCOSE: 91 mg/dL (ref 65–99)
Potassium: 3.7 mmol/L (ref 3.5–5.1)
SODIUM: 143 mmol/L (ref 135–145)

## 2015-09-22 LAB — CBC
HCT: 28.5 % — ABNORMAL LOW (ref 35.0–47.0)
HEMOGLOBIN: 9 g/dL — AB (ref 12.0–16.0)
MCH: 30.4 pg (ref 26.0–34.0)
MCHC: 31.4 g/dL — AB (ref 32.0–36.0)
MCV: 96.6 fL (ref 80.0–100.0)
Platelets: 287 10*3/uL (ref 150–440)
RBC: 2.95 MIL/uL — ABNORMAL LOW (ref 3.80–5.20)
RDW: 20.7 % — ABNORMAL HIGH (ref 11.5–14.5)
WBC: 9.1 10*3/uL (ref 3.6–11.0)

## 2015-09-22 LAB — TROPONIN I: Troponin I: <0.03 ng/mL (ref <0.031)

## 2015-09-22 MED ORDER — POTASSIUM CHLORIDE CRYS ER 20 MEQ PO TBCR
20.0000 meq | EXTENDED_RELEASE_TABLET | Freq: Two times a day (BID) | ORAL | Status: DC
  Administered 2015-09-22 – 2015-09-25 (×6): 20 meq via ORAL
  Filled 2015-09-22 (×6): qty 1

## 2015-09-22 MED ORDER — PAROXETINE HCL 20 MG PO TABS
40.0000 mg | ORAL_TABLET | Freq: Every day | ORAL | Status: DC
  Administered 2015-09-22 – 2015-09-24 (×3): 40 mg via ORAL
  Filled 2015-09-22 (×3): qty 2

## 2015-09-22 MED ORDER — ASPIRIN EC 81 MG PO TBEC
81.0000 mg | DELAYED_RELEASE_TABLET | Freq: Every day | ORAL | Status: DC
  Administered 2015-09-23 – 2015-09-25 (×3): 81 mg via ORAL
  Filled 2015-09-22 (×3): qty 1

## 2015-09-22 MED ORDER — GEMFIBROZIL 600 MG PO TABS
600.0000 mg | ORAL_TABLET | Freq: Two times a day (BID) | ORAL | Status: DC
  Administered 2015-09-23 – 2015-09-25 (×5): 600 mg via ORAL
  Filled 2015-09-22 (×8): qty 1

## 2015-09-22 MED ORDER — PANTOPRAZOLE SODIUM 40 MG PO TBEC
40.0000 mg | DELAYED_RELEASE_TABLET | Freq: Every day | ORAL | Status: DC
  Administered 2015-09-24 – 2015-09-25 (×2): 40 mg via ORAL
  Filled 2015-09-22 (×2): qty 1

## 2015-09-22 MED ORDER — PRAVASTATIN SODIUM 20 MG PO TABS
40.0000 mg | ORAL_TABLET | Freq: Every day | ORAL | Status: DC
  Administered 2015-09-23 – 2015-09-24 (×2): 40 mg via ORAL
  Filled 2015-09-22 (×2): qty 2

## 2015-09-22 MED ORDER — VITAMIN D 1000 UNITS PO TABS
1000.0000 [IU] | ORAL_TABLET | Freq: Every day | ORAL | Status: DC
  Administered 2015-09-23 – 2015-09-25 (×3): 1000 [IU] via ORAL
  Filled 2015-09-22 (×4): qty 1

## 2015-09-22 MED ORDER — ISOSORBIDE MONONITRATE ER 60 MG PO TB24
60.0000 mg | ORAL_TABLET | Freq: Every day | ORAL | Status: DC
  Administered 2015-09-22 – 2015-09-25 (×4): 60 mg via ORAL
  Filled 2015-09-22 (×4): qty 1

## 2015-09-22 MED ORDER — PILOCARPINE HCL 1 % OP SOLN
1.0000 [drp] | Freq: Four times a day (QID) | OPHTHALMIC | Status: DC
  Administered 2015-09-23 – 2015-09-25 (×3): 1 [drp] via OPHTHALMIC
  Filled 2015-09-22: qty 15

## 2015-09-22 MED ORDER — ENOXAPARIN SODIUM 40 MG/0.4ML ~~LOC~~ SOLN
40.0000 mg | SUBCUTANEOUS | Status: DC
  Administered 2015-09-22 – 2015-09-23 (×2): 40 mg via SUBCUTANEOUS
  Filled 2015-09-22 (×2): qty 0.4

## 2015-09-22 MED ORDER — SENNA 8.6 MG PO TABS: 1.0000 | ORAL_TABLET | Freq: Every evening | ORAL | Status: DC | PRN

## 2015-09-22 MED ORDER — ACETAMINOPHEN 325 MG PO TABS
650.0000 mg | ORAL_TABLET | Freq: Four times a day (QID) | ORAL | Status: DC | PRN
  Administered 2015-09-23: 650 mg via ORAL
  Filled 2015-09-22: qty 2

## 2015-09-22 MED ORDER — FUROSEMIDE 10 MG/ML IJ SOLN
40.0000 mg | Freq: Every day | INTRAMUSCULAR | Status: DC
  Administered 2015-09-23: 40 mg via INTRAVENOUS
  Filled 2015-09-22: qty 4

## 2015-09-22 MED ORDER — ADULT MULTIVITAMIN W/MINERALS CH
1.0000 | ORAL_TABLET | Freq: Every day | ORAL | Status: DC
  Administered 2015-09-23 – 2015-09-24 (×2): 1 via ORAL
  Filled 2015-09-22 (×2): qty 1

## 2015-09-22 MED ORDER — CLOPIDOGREL BISULFATE 75 MG PO TABS
75.0000 mg | ORAL_TABLET | Freq: Every day | ORAL | Status: DC
  Administered 2015-09-23 – 2015-09-25 (×3): 75 mg via ORAL
  Filled 2015-09-22 (×3): qty 1

## 2015-09-22 MED ORDER — ADULT MULTIVITAMIN W/MINERALS CH: 1.0000 | ORAL_TABLET | Freq: Every day | ORAL | Status: DC

## 2015-09-22 MED ORDER — NYSTATIN 100000 UNIT/GM EX CREA
1.0000 "application " | TOPICAL_CREAM | Freq: Three times a day (TID) | CUTANEOUS | Status: DC | PRN
  Filled 2015-09-22: qty 15

## 2015-09-22 MED ORDER — MELATONIN 3 MG PO TABS: 3.0000 mg | ORAL_TABLET | Freq: Every evening | ORAL | Status: DC | PRN

## 2015-09-22 MED ORDER — ONDANSETRON HCL 4 MG PO TABS
4.0000 mg | ORAL_TABLET | Freq: Three times a day (TID) | ORAL | Status: DC | PRN
Start: 2015-09-22 — End: 2015-09-25

## 2015-09-22 MED ORDER — SODIUM CHLORIDE 0.9% FLUSH
3.0000 mL | Freq: Two times a day (BID) | INTRAVENOUS | Status: DC
  Administered 2015-09-22 – 2015-09-25 (×5): 3 mL via INTRAVENOUS

## 2015-09-22 MED ORDER — ACETAMINOPHEN 650 MG RE SUPP: 650.0000 mg | Freq: Four times a day (QID) | RECTAL | Status: DC | PRN

## 2015-09-22 MED ORDER — FUROSEMIDE 10 MG/ML IJ SOLN
40.0000 mg | Freq: Once | INTRAMUSCULAR | Status: AC
  Administered 2015-09-22: 40 mg via INTRAVENOUS
  Filled 2015-09-22: qty 4

## 2015-09-22 MED ORDER — HYDRALAZINE HCL 50 MG PO TABS
100.0000 mg | ORAL_TABLET | Freq: Three times a day (TID) | ORAL | Status: DC
  Administered 2015-09-22 – 2015-09-25 (×8): 100 mg via ORAL
  Filled 2015-09-22 (×8): qty 2

## 2015-09-22 MED ORDER — PANTOPRAZOLE SODIUM 40 MG PO TBEC
40.0000 mg | DELAYED_RELEASE_TABLET | Freq: Every day | ORAL | Status: DC
  Administered 2015-09-22: 40 mg via ORAL
  Filled 2015-09-22: qty 1

## 2015-09-22 MED ORDER — AMLODIPINE BESYLATE 5 MG PO TABS
2.5000 mg | ORAL_TABLET | Freq: Every day | ORAL | Status: DC
  Administered 2015-09-22 – 2015-09-25 (×4): 2.5 mg via ORAL
  Filled 2015-09-22 (×4): qty 1

## 2015-09-22 MED ORDER — ALLOPURINOL 100 MG PO TABS
100.0000 mg | ORAL_TABLET | Freq: Every day | ORAL | Status: DC
  Administered 2015-09-23 – 2015-09-25 (×3): 100 mg via ORAL
  Filled 2015-09-22 (×3): qty 1

## 2015-09-22 MED ORDER — METOPROLOL SUCCINATE ER 50 MG PO TB24
50.0000 mg | ORAL_TABLET | Freq: Every day | ORAL | Status: DC
  Administered 2015-09-22 – 2015-09-25 (×4): 50 mg via ORAL
  Filled 2015-09-22 (×4): qty 1

## 2015-09-22 MED ORDER — FOLIC ACID 1 MG PO TABS
1.0000 mg | ORAL_TABLET | Freq: Every day | ORAL | Status: DC
  Administered 2015-09-23 – 2015-09-25 (×3): 1 mg via ORAL
  Filled 2015-09-22 (×3): qty 1

## 2015-09-22 NOTE — ED Notes (Addendum)
Pt reports shortness of breath starting last night, pt denies chest pain, pt reports increased swelling to bilateral feet, daughter reports a 20 lb weight increase in the last week

## 2015-09-22 NOTE — ED Notes (Signed)
MD at bedside. 

## 2015-09-22 NOTE — ED Notes (Signed)
Pt transported to room 235 

## 2015-09-22 NOTE — H&P (Signed)
[facility] - [place] at [facility]   PATIENT NAME: Sally Curry Chou    MR#:  [id number]  DATE OF BIRTH:  08-19-1940  DATE OF ADMISSION:  09/22/2015  PRIMARY CARE PHYSICIAN: Leotis ShamesJasmine Singh  REQUESTING/REFERRING PHYSICIAN:   CHIEF COMPLAINT:   Chief Complaint  Patient presents with  . Shortness of Breath    HISTORY OF PRESENT ILLNESS:  Sally Curry Cristiano  is a 75 y.o. female with a known history of CHF. She has been having weight gain about 20 pounds over past week. She's been short of breath. Worsening swelling of the legs. The patient stated that she saw her PMD who sent her here to the hospital. The patient had a recent abdominal infection and some draining areas on the abdomen and she was concerned with infection. Hospitalists were consulted for further evaluation patient having heart failure on the chest x-ray and elevated BNP.  PAST MEDICAL HISTORY:   Past Medical History  Diagnosis Date  . CAD (coronary artery disease)   . Hypertension   . Stroke (HCC)   . Gout   . PVD (peripheral vascular disease) (HCC)   . Anemia   . Chronic kidney disease   . Anxiety   . CHF (congestive heart failure) (HCC)   . Depression   . Glaucoma   . Presence of permanent cardiac pacemaker   . Renal insufficiency     PAST SURGICAL HISTORY:   Past Surgical History  Procedure Laterality Date  . Coronary angioplasty with stent placement    . Pacemaker insertion    . Cholecystectomy    . Carotid endarterectomy    . Percutaneous placement intravascular stent cervical carotid artery    . Appendectomy    . Abdominal surgery      SOCIAL HISTORY:   Social History  Substance Use Topics  . Smoking status: Never Smoker   . Smokeless tobacco: Not on file  . Alcohol Use: No    FAMILY HISTORY:   Family History  Problem Relation Age of Onset  . Hypertension Mother   . Parkinson's disease Mother   . Emphysema Father   . Diabetes Sister     DRUG ALLERGIES:  No  Known Allergies  REVIEW OF SYSTEMS:  CONSTITUTIONAL: No fever, positive for weakness. Positive for 20 pound weight gain EYES: No blurred or double vision. Wears glasses EARS, NOSE, AND THROAT: No tinnitus or ear pain. No sore throat RESPIRATORY: No cough, positive for shortness of breath, no wheezing or hemoptysis.  CARDIOVASCULAR: Positive for chest pain in the day, positive for orthopnea, positive for edema.  GASTROINTESTINAL: No nausea, vomiting, or abdominal pain. No blood in bowel movements. Positive for loose stools GENITOURINARY: No dysuria, hematuria.  ENDOCRINE: No polyuria, nocturia,  HEMATOLOGY: History of anemia, no easy bruising or bleeding SKIN: No rash or lesion. MUSCULOSKELETAL: No joint pain or arthritis.   NEUROLOGIC: No tingling, numbness, weakness.  PSYCHIATRY: No anxiety or depression.   MEDICATIONS AT HOME:   Prior to Admission medications   Medication Sig Start Date End Date Taking? Authorizing Provider  allopurinol (ZYLOPRIM) 100 MG tablet Take 100 mg by mouth daily.   Yes Historical Provider, MD  amLODipine (NORVASC) 2.5 MG tablet Take 1 tablet (2.5 mg total) by mouth daily. 03/19/15  Yes Leotis ShamesJasmine Singh, MD  aspirin EC 81 MG tablet Take 81 mg by mouth daily.   Yes Historical Provider, MD  cholecalciferol (VITAMIN D) 1000 units tablet Take 1,000 Units by mouth daily.   Yes Historical Provider, MD  clopidogrel (PLAVIX) 75 MG tablet Take 75 mg by mouth daily.   Yes Historical Provider, MD  folic acid (FOLVITE) 1 MG tablet Take 1 mg by mouth daily.   Yes Historical Provider, MD  furosemide (LASIX) 20 MG tablet Take 1 tablet (20 mg total) by mouth daily. 06/09/15  Yes Ripudeep Jenna Luo, MD  gemfibrozil (LOPID) 600 MG tablet Take 600 mg by mouth 2 (two) times daily before a meal.   Yes Historical Provider, MD  hydrALAZINE (APRESOLINE) 100 MG tablet Take 100 mg by mouth 3 (three) times daily.   Yes Historical Provider, MD  isosorbide mononitrate (IMDUR) 60 MG 24 hr tablet  Take 60 mg by mouth daily.   Yes Historical Provider, MD  lovastatin (MEVACOR) 40 MG tablet Take 40 mg by mouth at bedtime.   Yes Historical Provider, MD  Melatonin 3 MG TABS Take 3 mg by mouth at bedtime as needed (for sleep).   Yes Historical Provider, MD  metoprolol succinate (TOPROL-XL) 50 MG 24 hr tablet Take 50 mg by mouth daily. Take with or immediately following a meal.   Yes Historical Provider, MD  Multiple Vitamin (MULTIVITAMIN WITH MINERALS) TABS tablet Take 1 tablet by mouth daily.   Yes Historical Provider, MD  nystatin cream (MYCOSTATIN) Apply 1 application topically 2 (two) times daily as needed (for rash/itching).    Yes Historical Provider, MD  omeprazole (PRILOSEC) 40 MG capsule Take 40 mg by mouth daily.   Yes Historical Provider, MD  ondansetron (ZOFRAN) 4 MG tablet Take 4 mg by mouth every 8 (eight) hours as needed for nausea or vomiting.   Yes Historical Provider, MD  PARoxetine (PAXIL) 40 MG tablet Take 40 mg by mouth at bedtime.   Yes Historical Provider, MD  pilocarpine (PILOCAR) 1 % ophthalmic solution Place 1 drop into both eyes 4 (four) times daily.   Yes Historical Provider, MD  potassium chloride SA (K-DUR,KLOR-CON) 20 MEQ tablet Take 1 tablet (20 mEq total) by mouth 2 (two) times daily. 06/09/15  Yes Ripudeep Jenna Luo, MD  senna (SENOKOT) 8.6 MG tablet Take 1 tablet by mouth at bedtime as needed for constipation.   Yes Historical Provider, MD      VITAL SIGNS:  Blood pressure 184/70, pulse 66, temperature 98.1 F (36.7 C), temperature source Oral, resp. rate 23, height 5' (1.524 m), weight 78.019 kg (172 lb), SpO2 94 %.  PHYSICAL EXAMINATION:  GENERAL:  75 y.o.-year-old patient lying in the bed with no acute distress.  EYES: Pupils equal, round, reactive to light and accommodation. No scleral icterus. Extraocular muscles intact.  HEENT: Head atraumatic, normocephalic. Oropharynx and nasopharynx clear.  NECK:  Supple, no jugular venous distention. No thyroid  enlargement, no tenderness.  LUNGS: Decreased breath sounds bilaterally, no wheezing, positive rales at the bases, no rhonchi or crepitation. No use of accessory muscles of respiration.  CARDIOVASCULAR: S1, S2 normal. 3 and a 6 systolic murmurs, no rubs, or gallops.  ABDOMEN: Soft, nontender, nondistended. Bowel sounds present. No organomegaly or mass.  EXTREMITIES: 3+ pedal edema, no cyanosis, or clubbing.  NEUROLOGIC: Cranial nerves II through XII are intact. Muscle strength 5/5 in all extremities. Sensation intact. Gait not checked.  PSYCHIATRIC: The patient is alert and oriented x 3.  SKIN: Lower abdomen slight erythema looks more fungal than infection. Right abdomen small little ulceration with no signs of infection. Left lower abdomen small ulceration no signs of infection. Vaginal area and pubis erythema. Nurse present for that exam.  LABORATORY PANEL:  CBC  Recent Labs Lab 09/22/15 1841  WBC 9.1  HGB 9.0*  HCT 28.5*  PLT 287   ------------------------------------------------------------------------------------------------------------------  Chemistries   Recent Labs Lab 09/22/15 1841  NA 143  K 3.7  CL 110  CO2 21*  GLUCOSE 91  BUN 24*  CREATININE 1.11*  CALCIUM 10.0   ------------------------------------------------------------------------------------------------------------------  Cardiac Enzymes  Recent Labs Lab 09/22/15 1928  TROPONINI <0.03   ------------------------------------------------------------------------------------------------------------------  RADIOLOGY:  Dg Chest 2 View  09/22/2015  CLINICAL DATA:  Shortness of breath started last night. Increased swelling in bilateral feet. EXAM: CHEST  2 VIEW COMPARISON:  08/16/2015 FINDINGS: Two views of the chest demonstrate a left dual chamber cardiac pacemaker which appears to be stable. There is cardiomegaly. Prominent interstitial lung markings bilaterally are concerning for pulmonary edema. No  large pleural effusions. No acute bone abnormalities. IMPRESSION: Cardiomegaly with evidence for interstitial pulmonary edema. Electronically Signed   By: Richarda Overlie M.D.   On: 09/22/2015 17:41    EKG:   Sinus rhythm 68 bpm low voltage  IMPRESSION AND PLAN:   1. Acute on chronic diastolic congestive heart failure with mild to moderate mitral regurgitation. This is probably brought on from elevated blood pressure. I will give her blood pressure medications this evening and continue to monitor. Patient was given 1 dose of IV Lasix 40 mg and I will continue that on a daily basis. Repeat echocardiogram. 2. Accelerated hypertension restart blood pressure medications and continue to monitor 3. Fungal infection groin and vaginal area will put on nystatin cream 4. Anemia likely of chronic disease. 5. Chronic kidney disease stage II- watch with diuresis 6. Depression- continue Paxil 7. Glaucoma unspecified continue eyedrops  All the records are reviewed and case discussed with ED provider. Management plans discussed with the patient, family and they are in agreement.  CODE STATUS: Full code  TOTAL TIME TAKING CARE OF THIS PATIENT: 50 minutes.    Alford Highland M.D on 09/22/2015 at 8:33 PM  Between 7am to 6pm - Pager - [phone]  After 6pm call admission pager [phone]  [facility]  [phone]  CC: Primary care physician; Clide Dales, FNP

## 2015-09-22 NOTE — ED Notes (Signed)
REDS VEST READING= 49 CHEST RULER=9.5  VEST FITTING TASKS: POSTURE=sitting HEIGHT MARKER=small CENTER STRIP=shifted  COMMENTS:approved by [employer]

## 2015-09-22 NOTE — ED Notes (Signed)
Admitting MD at bedside and pts daughter is on phone involved with assessment and update.

## 2015-09-22 NOTE — ED Provider Notes (Signed)
[facility] Emergency Department Provider Note  ____________________________________________  Time seen: Approximately 6:07 PM  I have reviewed the triage vital signs and the nursing notes.   HISTORY  Chief Complaint Shortness of Breath    HPI Sally Curry is a 75 y.o. female with a history of CAD, CHF, [facility] ED, CVA presenting with exertional shortness of breath, bilateral lower extremity swelling, and 20 pound weight gain. The patient has had a couple located recent clinical course, with panniculectomy several months ago followed by admission for wound [facility] at [facility]. Since then, she has been in rehabilitation, or living with her daughter. Normally, she is able to ambulate and perform her basic ADLs. 2 weeks ago, her daughter went on vacation and during the week that she was gone, her mother had gained 20 pounds in what appeared to be peripheral edema. Over the past 24 hours, the patient has developed a severe exertional dyspnea with decreased exercise tolerance. She denies any chest pain, palpitations, lightheadedness or syncope. She has not had any cough or cold symptoms, fever or chills. No urinary symptoms. She has had some mild increase in her baseline confusion.   Past Medical History  Diagnosis Date  . CAD (coronary artery disease)   . Hypertension   . Stroke (HCC)   . Gout   . PVD (peripheral vascular disease) (HCC)   . Anemia   . Chronic kidney disease   . Anxiety   . CHF (congestive heart failure) (HCC)   . Depression   . Glaucoma   . Presence of permanent cardiac pacemaker   . Renal insufficiency     Patient Active Problem List   Diagnosis Date Noted  . Acute CHF (congestive heart failure) (HCC) 06/04/2015  . Flank pain 06/04/2015  . UTI (lower urinary tract infection) 06/04/2015  . Acute congestive heart failure (HCC)   . Chest pain 03/16/2015  . Duodenal ulcer with hemorrhage 01/20/2014  . Acute lower GI bleeding 01/20/2014  . CAD  (coronary artery disease), native coronary artery 01/20/2014  . Essential hypertension, benign 01/20/2014  . CHF with unknown LVEF (HCC) 01/20/2014  . Dyslipidemia 01/20/2014  . Gout of right knee 01/20/2014  . Hypokalemia 01/20/2014  . Glaucoma 01/20/2014  . Depression with anxiety 01/20/2014    Past Surgical History  Procedure Laterality Date  . Coronary angioplasty with stent placement    . Pacemaker insertion    . Cholecystectomy    . Carotid endarterectomy    . Percutaneous placement intravascular stent cervical carotid artery    . Appendectomy      Current Outpatient Rx  Name  Route  Sig  Dispense  Refill  . allopurinol (ZYLOPRIM) 100 MG tablet   Oral   Take 100 mg by mouth daily.         Marland Kitchen. amLODipine (NORVASC) 2.5 MG tablet   Oral   Take 1 tablet (2.5 mg total) by mouth daily.   30 tablet   3   . aspirin EC 81 MG tablet   Oral   Take 81 mg by mouth daily.         . cholecalciferol (VITAMIN D) 1000 units tablet   Oral   Take 1,000 Units by mouth daily.         . clopidogrel (PLAVIX) 75 MG tablet   Oral   Take 75 mg by mouth daily.         . folic acid (FOLVITE) 1 MG tablet   Oral   Take  1 mg by mouth daily.         . furosemide (LASIX) 20 MG tablet   Oral   Take 1 tablet (20 mg total) by mouth daily.   30 tablet      . gemfibrozil (LOPID) 600 MG tablet   Oral   Take 600 mg by mouth 2 (two) times daily before a meal.         . hydrALAZINE (APRESOLINE) 100 MG tablet   Oral   Take 100 mg by mouth 3 (three) times daily.         . isosorbide mononitrate (IMDUR) 60 MG 24 hr tablet   Oral   Take 60 mg by mouth daily.         Marland Kitchen lovastatin (MEVACOR) 40 MG tablet   Oral   Take 40 mg by mouth at bedtime.         . Melatonin 3 MG TABS   Oral   Take 3 mg by mouth at bedtime as needed (for sleep).         . metoprolol succinate (TOPROL-XL) 50 MG 24 hr tablet   Oral   Take 50 mg by mouth daily. Take with or immediately following a  meal.         . Multiple Vitamin (MULTIVITAMIN WITH MINERALS) TABS tablet   Oral   Take 1 tablet by mouth daily.         Marland Kitchen omeprazole (PRILOSEC) 40 MG capsule   Oral   Take 40 mg by mouth daily.         . ondansetron (ZOFRAN) 4 MG tablet   Oral   Take 4 mg by mouth every 8 (eight) hours as needed for nausea or vomiting.         Marland Kitchen PARoxetine (PAXIL) 40 MG tablet   Oral   Take 40 mg by mouth at bedtime.         . pilocarpine (PILOCAR) 1 % ophthalmic solution   Both Eyes   Place 1 drop into both eyes 4 (four) times daily.         . potassium chloride SA (K-DUR,KLOR-CON) 20 MEQ tablet   Oral   Take 1 tablet (20 mEq total) by mouth 2 (two) times daily.         Marland Kitchen senna (SENOKOT) 8.6 MG tablet   Oral   Take 1 tablet by mouth at bedtime as needed for constipation.           Allergies Review of patient's allergies indicates no known allergies.  Family History  Problem Relation Age of Onset  . Hypertension Mother   . Parkinson's disease Mother   . Emphysema Father   . Diabetes Sister     Social History Social History  Substance Use Topics  . Smoking status: Never Smoker   . Smokeless tobacco: None  . Alcohol Use: No    Review of Systems Constitutional: No fever/chills.Positive altered mental status. Negative lightheadedness or syncope. Eyes: No visual changes. ENT: No sore throat. No congestion or rhinorrhea. Cardiovascular: Denies chest pain. Denies palpitations. Respiratory: Positive shortness of breath.  No cough. Gastrointestinal: No abdominal pain.  No nausea, no vomiting.  No diarrhea.  No constipation. Genitourinary: Negative for dysuria. Musculoskeletal: Negative for back pain. Positive peripheral edema. Skin: Negative for rash. Neurological: Negative for headaches. No focal numbness, tingling or weakness.   10-point ROS otherwise negative.  ____________________________________________   PHYSICAL EXAM:  VITAL SIGNS: ED Triage Vitals   Enc Vitals  Group     BP 09/22/15 1631 186/57 mmHg     Pulse Rate 09/22/15 1631 69     Resp 09/22/15 1631 20     Temp 09/22/15 1631 98.1 F (36.7 C)     Temp Source 09/22/15 1631 Oral     SpO2 09/22/15 1631 96 %     Weight 09/22/15 1631 172 lb (78.019 kg)     Height 09/22/15 1631 5' (1.524 m)     Head Cir --      Peak Flow --      Pain Score --      Pain Loc --      Pain Edu? --      Excl. in GC? --     Constitutional: Alert and oriented. Chronically ill appearing and in no acute distress. Answers questions appropriately. Eyes: Conjunctivae are normal.  EOMI. No scleral icterus. Head: Atraumatic. Nose: No congestion/rhinnorhea. Mouth/Throat: Mucous membranes are moist.  Neck: No stridor.  Supple.  No JVD. Cardiovascular: Normal rate, regular rhythm. No murmurs, rubs or gallops.  Respiratory: Normal respiratory effort.  No accessory muscle use or retractions. Lungs CTAB.  No wheezes, rales or ronchi. Gastrointestinal: Soft, nontender and mildly distended.  Patient has a 18 inch lower abdominal incision with healing open area on both distal aspects where there were previously drains without any swelling, erythema, or drainage. No guarding or rebound.  No peritoneal signs. Genitourinary: Positive edema in the labia bilaterally. Musculoskeletal: Positive symmetric bilateral lower extremity edema to the mid thigh that is pitting.. No ttp in the calves or palpable cords.  Negative Homan's sign. Neurologic:  A&Ox3.  Speech is clear.  Face and smile are symmetric.  EOMI.  Moves all extremities well. Skin:  Skin is warm, dry and intact. No rash noted. Positive pallor Psychiatric: Mood and affect are normal. Speech and behavior are normal.  Normal judgement.  ____________________________________________   LABS (all labs ordered are listed, but only abnormal results are displayed)  Labs Reviewed  CBC - Abnormal; Notable for the following:    RBC 2.95 (*)    Hemoglobin 9.0 (*)    HCT  28.5 (*)    MCHC 31.4 (*)    RDW 20.7 (*)    All other components within normal limits  BASIC METABOLIC PANEL - Abnormal; Notable for the following:    CO2 21 (*)    BUN 24 (*)    Creatinine, Ser 1.11 (*)    GFR calc non Af Amer 48 (*)    GFR calc Af Amer 56 (*)    All other components within normal limits  BRAIN NATRIURETIC PEPTIDE - Abnormal; Notable for the following:    B Natriuretic Peptide 722.0 (*)    All other components within normal limits  TROPONIN I   ____________________________________________  EKG  ED ECG REPORT I, Rockne Menghini, the attending physician, personally viewed and interpreted this ECG.   Date: 09/22/2015  EKG Time: 1626  Rate: 72  Rhythm: normal sinus rhythm  Axis: Normal  Intervals:none  ST&T Change: No ST elevation.  ____________________________________________  RADIOLOGY  Dg Chest 2 View  09/22/2015  CLINICAL DATA:  Shortness of breath started last night. Increased swelling in bilateral feet. EXAM: CHEST  2 VIEW COMPARISON:  08/16/2015 FINDINGS: Two views of the chest demonstrate a left dual chamber cardiac pacemaker which appears to be stable. There is cardiomegaly. Prominent interstitial lung markings bilaterally are concerning for pulmonary edema. No large pleural effusions. No acute bone abnormalities. IMPRESSION:  Cardiomegaly with evidence for interstitial pulmonary edema. Electronically Signed   By: Richarda Overlie M.D.   On: 09/22/2015 17:41    ____________________________________________   PROCEDURES  Procedure(s) performed: None  Critical Care performed: No ____________________________________________   INITIAL IMPRESSION / ASSESSMENT AND PLAN / ED COURSE  Pertinent labs & imaging results that were available during my care of the patient were reviewed by me and considered in my medical decision making (see chart for details).  75 y.o. female with a history of congestive heart failure presenting with a 20 lb weight gain,  increased peripheral edema, and shortness of breath with decreased exercise tolerance. The patient clinically does appear to be fluid overloaded and is likely having acute on chronic congestive heart failure. We will evaluate her for ACS or a mild of the patient is not been having any chest pain. The patient will likely require admission to the hospital.  ____________________________________________  FINAL CLINICAL IMPRESSION(S) / ED DIAGNOSES  Final diagnoses:  Acute on chronic systolic congestive heart failure (HCC)      NEW MEDICATIONS STARTED DURING THIS VISIT:  New Prescriptions   No medications on file     Rockne Menghini, MD 09/22/15 1954

## 2015-09-22 NOTE — Progress Notes (Signed)
Pt asked RN to call and update daughter. RN attempted to call daughter, but no one answered and unable to leave voicemail.  Will attempted to try again in morning.   Mayra NeerNesbitt, Onyx Schirmer M

## 2015-09-22 NOTE — ED Notes (Signed)
Pt reports she was released from [facility] and sent to rehab after lower abd surgery. Pt was released from rehab facility on Wednesday (one week ago) pts family reports pt has gained 20 pounds since arrival to rehab and is having increased SOB. +2 pitting edema noted to bilateral lower extremities. Pt has hx of CHF and reports already taking 20mg  lasix at home. Redness noted around incision scar taht pts family verbalized concern about. No increased warmth felt to area and family denies pt having fevers. Pt denies increased pain to site or to lower abd.

## 2015-09-23 ENCOUNTER — Inpatient Hospital Stay
Admit: 2015-09-23 | Discharge: 2015-09-23 | Disposition: A | Discharge status: Home / Self Care | Payer: Medicare Other | Attending: Internal Medicine | Admitting: Internal Medicine

## 2015-09-23 LAB — CBC
HCT: 24.5 % — ABNORMAL LOW (ref 35.0–47.0)
Hemoglobin: 7.9 g/dL — ABNORMAL LOW (ref 12.0–16.0)
MCH: 30.6 pg (ref 26.0–34.0)
MCHC: 32.3 g/dL (ref 32.0–36.0)
MCV: 94.7 fL (ref 80.0–100.0)
PLATELETS: 265 10*3/uL (ref 150–440)
RBC: 2.58 MIL/uL — ABNORMAL LOW (ref 3.80–5.20)
RDW: 20.7 % — AB (ref 11.5–14.5)
WBC: 6.8 10*3/uL (ref 3.6–11.0)

## 2015-09-23 LAB — ECHOCARDIOGRAM COMPLETE
HEIGHTINCHES: 60 in
WEIGHTICAEL: 2619.2 [oz_av]

## 2015-09-23 LAB — IRON AND TIBC
Iron: 37 ug/dL (ref 28–170)
SATURATION RATIOS: 14 % (ref 10.4–31.8)
TIBC: 270 ug/dL (ref 250–450)
UIBC: 233 ug/dL

## 2015-09-23 LAB — FERRITIN: Ferritin: 84 ng/mL (ref 11–307)

## 2015-09-23 LAB — BASIC METABOLIC PANEL
Anion gap: 9 (ref 5–15)
BUN: 24 mg/dL — AB (ref 6–20)
CHLORIDE: 111 mmol/L (ref 101–111)
CO2: 22 mmol/L (ref 22–32)
CREATININE: 1.3 mg/dL — AB (ref 0.44–1.00)
Calcium: 9.4 mg/dL (ref 8.9–10.3)
GFR calc Af Amer: 46 mL/min — ABNORMAL LOW (ref >60)
GFR calc non Af Amer: 40 mL/min — ABNORMAL LOW (ref >60)
GLUCOSE: 91 mg/dL (ref 65–99)
Potassium: 3.7 mmol/L (ref 3.5–5.1)
SODIUM: 142 mmol/L (ref 135–145)

## 2015-09-23 LAB — VITAMIN B12: VITAMIN B 12: 207 pg/mL (ref 180–914)

## 2015-09-23 MED ORDER — COLCHICINE 0.6 MG PO TABS
0.6000 mg | ORAL_TABLET | Freq: Every day | ORAL | Status: DC
  Administered 2015-09-23 – 2015-09-25 (×3): 0.6 mg via ORAL
  Filled 2015-09-23 (×4): qty 1

## 2015-09-23 NOTE — Progress Notes (Signed)
Pt recently discharged from rehab. Pt receiving services from [facility] for CNA, RN, & PT/OT.

## 2015-09-23 NOTE — Progress Notes (Signed)
*  PRELIMINARY RESULTS* Echocardiogram 2D Echocardiogram has been performed.  Sally Curry 09/23/2015, 9:57 AM

## 2015-09-23 NOTE — Progress Notes (Signed)
Pt complaining of pain in R leg. Pt states that she is having a gout flare up. Per pts daughter, she takes colchicine at home for gout flare ups. MD Pyreddy notified. Verbal order given for PO Colchicine 0.6 mg daily. RN will put in order and administer medication. Will continue to monitor.   Mayra NeerNesbitt, Kailey Esquilin M

## 2015-09-23 NOTE — Progress Notes (Signed)
RN updated daughter, Sally Curry, on pts status. All questions were answered.   Sally Curry, Sally Curry

## 2015-09-23 NOTE — Progress Notes (Signed)
[facility] - [place] at [facility]                                                                                                                                                                                            Patient Demographics   Sally Curry, is a 75 y.o. female, DOB - 21-Nov-1940, ZOX:[id number]  Admit date - 09/22/2015   Admitting Physician Alford Highland, MD  Outpatient Primary MD for the patient is Clide Dales, FNP   LOS - 1  Subjective:Patient admitted with acute CHF. Breathing improved. Her hemoglobin has trended down. She does have chronic anemia. Denies any blood in the stools no chest pain today.     Review of Systems:   CONSTITUTIONAL: No documented fever. No fatigue, weakness. No weight gain, no weight loss.  EYES: No blurry or double vision.  ENT: No tinnitus. No postnasal drip. No redness of the oropharynx.  RESPIRATORY: No cough, no wheeze, no hemoptysis. Positive dyspnea.  CARDIOVASCULAR: No chest pain. No orthopnea. No palpitations. No syncope.  GASTROINTESTINAL: No nausea, no vomiting or diarrhea. No abdominal pain. No melena or hematochezia.  GENITOURINARY: No dysuria or hematuria.  ENDOCRINE: No polyuria or nocturia. No heat or cold intolerance.  HEMATOLOGY: Positive anemia. No bruising. No bleeding.  INTEGUMENTARY: No rashes. No lesions.  MUSCULOSKELETAL: No arthritis. No swelling. No gout.  NEUROLOGIC: No numbness, tingling, or ataxia. No seizure-type activity.  PSYCHIATRIC: No anxiety. No insomnia. No ADD.    Vitals:   Filed Vitals:   09/22/15 2230 09/22/15 2307 09/23/15 0337 09/23/15 0725  BP: 177/67 158/49 133/54 143/55  Pulse: 58 61 60 80  Temp:  98.1 F (36.7 C) 98.5 F (36.9 C) 98.5 F (36.9 C)  TempSrc:  Oral Oral   Resp: Height:      Weight:  74.254 kg (163 lb 11.2 oz)    SpO2: 93% 91% 91% 96%    Wt Readings from Last 3 Encounters:  09/22/15 74.254 kg (163 lb 11.2 oz)  08/16/15  86 kg (189 lb 9.5 oz)  06/07/15 85.957 kg (189 lb 8 oz)     Intake/Output Summary (Last 24 hours) at 09/23/15 0928 Last data filed at 09/23/15 8119  Gross per 24 hour  Intake      0 ml  Output    400 ml  Net   -400 ml    Physical Exam:   GENERAL: Pleasant-appearing in no apparent distress.  HEAD, EYES, EARS, NOSE AND THROAT: Atraumatic, normocephalic. Extraocular muscles are intact. Pupils equal and reactive to light. Sclerae anicteric. No conjunctival injection. No oro-pharyngeal erythema.  NECK: Supple. There is no jugular venous distention. No bruits, no lymphadenopathy, no thyromegaly.  HEART: Regular rate and rhythm,. No murmurs, no rubs, no clicks.  LUNGS: Occasional crackles at the bases ABDOMEN: Soft, flat, nontender, nondistended. Has good bowel sounds. No hepatosplenomegaly appreciated.  EXTREMITIES: No evidence of any cyanosis, clubbing, or peripheral edema.  +2 pedal and radial pulses bilaterally.  NEUROLOGIC: The patient is alert, awake, and oriented x3 with no focal motor or sensory deficits appreciated bilaterally.  SKIN: Moist and warm with no rashes appreciated.  Psych: Not anxious, depressed LN: No inguinal LN enlargement    Antibiotics   Anti-infectives    None      Medications   Scheduled Meds: . allopurinol  100 mg Oral Daily  . amLODipine  2.5 mg Oral Daily  . aspirin EC  81 mg Oral Daily  . cholecalciferol  1,000 Units Oral Daily  . clopidogrel  75 mg Oral Daily  . colchicine  0.6 mg Oral Daily  . enoxaparin (LOVENOX) injection  40 mg Subcutaneous Q24H  . folic acid  1 mg Oral Daily  . furosemide  40 mg Intravenous Daily  . gemfibrozil  600 mg Oral BID AC  . hydrALAZINE  100 mg Oral TID  . isosorbide mononitrate  60 mg Oral Daily  . metoprolol succinate  50 mg Oral Daily  . multivitamin with minerals  1 tablet Oral Q supper  . [START ON 09/24/2015] pantoprazole  40 mg Oral QAC breakfast  . PARoxetine  40 mg Oral QHS  . pilocarpine  1 drop  Both Eyes QID  . potassium chloride SA  20 mEq Oral BID  . pravastatin  40 mg Oral q1800  . sodium chloride flush  3 mL Intravenous Q12H   Continuous Infusions:  PRN Meds:.acetaminophen **OR** acetaminophen, nystatin cream, ondansetron, senna   Data Review:   Micro Results No results found for this or any previous visit (from the past 240 hour(s)).  Radiology Reports Dg Chest 2 View  09/22/2015  CLINICAL DATA:  Shortness of breath started last night. Increased swelling in bilateral feet. EXAM: CHEST  2 VIEW COMPARISON:  08/16/2015 FINDINGS: Two views of the chest demonstrate a left dual chamber cardiac pacemaker which appears to be stable. There is cardiomegaly. Prominent interstitial lung markings bilaterally are concerning for pulmonary edema. No large pleural effusions. No acute bone abnormalities. IMPRESSION: Cardiomegaly with evidence for interstitial pulmonary edema. Electronically Signed   By: Richarda Overlie M.D.   On: 09/22/2015 17:41     CBC  Recent Labs Lab 09/22/15 1841 09/23/15 0427  WBC 9.1 6.8  HGB 9.0* 7.9*  HCT 28.5* 24.5*  PLT 287 265  MCV 96.6 94.7  MCH 30.4 30.6  MCHC 31.4* 32.3  RDW 20.7* 20.7*    Chemistries   Recent Labs Lab 09/22/15 1841 09/23/15 0427  NA 143 142  K 3.7 3.7  CL 110 111  CO2 21* 22  GLUCOSE 91 91  BUN 24* 24*  CREATININE 1.11* 1.30*  CALCIUM 10.0 9.4   ------------------------------------------------------------------------------------------------------------------ estimated creatinine clearance is 34.7 mL/min (by C-G formula based on Cr of 1.3). ------------------------------------------------------------------------------------------------------------------ No results for input(s): HGBA1C in the last 72 hours. ------------------------------------------------------------------------------------------------------------------ No results for input(s): CHOL, HDL, LDLCALC, TRIG, CHOLHDL, LDLDIRECT in the last 72  hours. ------------------------------------------------------------------------------------------------------------------ No results for input(s): TSH, T4TOTAL, T3FREE, THYROIDAB in the last 72 hours.  Invalid input(s): FREET3 ------------------------------------------------------------------------------------------------------------------ No results for input(s): VITAMINB12, FOLATE, FERRITIN, TIBC, IRON, RETICCTPCT in the last 72 hours.  Coagulation  profile No results for input(s): INR, PROTIME in the last 168 hours.  No results for input(s): DDIMER in the last 72 hours.  Cardiac Enzymes  Recent Labs Lab 09/22/15 1928  TROPONINI <0.03   ------------------------------------------------------------------------------------------------------------------ Invalid input(s): POCBNP    Assessment & Plan   Patient is a 75 year old white female presenting with weight gain and shortness (  1. Acute on chronic diastolic congestive heart failure with mild to moderate mitral regurgitation. T At this point we'll continue IV Lasix. Her ins and outs are not well documented. Her creatinine has bumped up a little. However that is close to her baseline. Repeat echo is currently pending she does have a history mitral regurg. 2. Accelerated hypertension blood pressure improved this morning. Continue amlodipine and hydralazine and Toprol-XL. She is also on Imdur. 3. Fungal infection groin continue nystatin cream 4. Anemia  her hemoglobin has worsened since yesterday. At this point I will do iron and ferritin B12 guaiac her stool. We will transfuse if less than 7.. 5. Chronic kidney disease stage II-  renal function little elevated monitor GFR 6. Depression- continue Paxil 7. Glaucoma unspecified continue eyedrops     Code Status Orders        Start     Ordered   09/22/15 2029  Full code   Continuous     09/22/15 2029    Code Status History    Date Active Date Inactive Code Status Order ID  Comments User Context   06/04/2015  4:30 AM 06/07/2015  7:22 PM Full Code [id number]  Alberteen Samhristopher P Danford, MD Inpatient   03/16/2015  7:23 PM 03/19/2015  7:52 PM Full Code [id number]  Gale Journeyatherine P Walsh, MD Inpatient           Consults None DVT Prophylaxis  Lovenox   Lab Results  Component Value Date   PLT 265 09/23/2015     Time Spent in minutes  35min  Greater than 50% of time spent in care coordination and counseling patient regarding the condition and plan of care.   Auburn BilberryPATEL, Ann Bohne M.D on 09/23/2015 at 9:28 AM  Between 7am to 6pm - Pager - [phone]  After 6pm go to [email or web address] - password [email or web address] [facility]  [facility]   Office  [phone]

## 2015-09-23 NOTE — Care Management Note (Signed)
Case Management Note  Patient Details  Name: Nash DimmerDawn Gappa MRN: [id number] Date of Birth: 05-09-1941  Subjective/Objective:    Admitted with CHF. Receiving IV lasix.  Notified [facility] of admission. Open to SN, HHA, PT and OT.  Following progression               Action/Plan:   Expected Discharge Date:                  Expected Discharge Plan:  Home w Home Health Services  In-House Referral:     Discharge planning Services  CM Consult  Post Acute Care Choice:  Resumption of Svcs/PTA Provider Choice offered to:     DME Arranged:    DME Agency:     HH Arranged:    HH Agency:  [facility]  Status of Service:  In process, will continue to follow  Medicare Important Message Given:    Date Medicare IM Given:    Medicare IM give by:    Date Additional Medicare IM Given:    Additional Medicare Important Message give by:     If discussed at Long Length of Stay Meetings, dates discussed:    Additional Comments:  Marily MemosLisa M Ranbir Chew, RN 09/23/2015, 1:42 PM

## 2015-09-24 LAB — CBC
HEMATOCRIT: 26.2 % — AB (ref 35.0–47.0)
HEMOGLOBIN: 8.3 g/dL — AB (ref 12.0–16.0)
MCH: 30 pg (ref 26.0–34.0)
MCHC: 31.9 g/dL — ABNORMAL LOW (ref 32.0–36.0)
MCV: 94.3 fL (ref 80.0–100.0)
Platelets: 262 10*3/uL (ref 150–440)
RBC: 2.78 MIL/uL — AB (ref 3.80–5.20)
RDW: 20.7 % — AB (ref 11.5–14.5)
WBC: 8.4 10*3/uL (ref 3.6–11.0)

## 2015-09-24 LAB — BASIC METABOLIC PANEL
Anion gap: 9 (ref 5–15)
BUN: 32 mg/dL — AB (ref 6–20)
CHLORIDE: 109 mmol/L (ref 101–111)
CO2: 22 mmol/L (ref 22–32)
CREATININE: 1.65 mg/dL — AB (ref 0.44–1.00)
Calcium: 8.9 mg/dL (ref 8.9–10.3)
GFR, EST AFRICAN AMERICAN: 34 mL/min — AB (ref >60)
GFR, EST NON AFRICAN AMERICAN: 30 mL/min — AB (ref >60)
GLUCOSE: 105 mg/dL — AB (ref 65–99)
Potassium: 4.2 mmol/L (ref 3.5–5.1)
Sodium: 140 mmol/L (ref 135–145)

## 2015-09-24 MED ORDER — FERROUS SULFATE 325 (65 FE) MG PO TABS
325.0000 mg | ORAL_TABLET | Freq: Two times a day (BID) | ORAL | Status: DC
  Administered 2015-09-24 – 2015-09-25 (×3): 325 mg via ORAL
  Filled 2015-09-24 (×3): qty 1

## 2015-09-24 MED ORDER — IRON (FERROUS SULFATE) 142 (45 FE) MG PO TBCR: 142.0000 mg | EXTENDED_RELEASE_TABLET | Freq: Two times a day (BID) | ORAL | Status: DC

## 2015-09-24 MED ORDER — ENOXAPARIN SODIUM 30 MG/0.3ML ~~LOC~~ SOLN
30.0000 mg | SUBCUTANEOUS | Status: DC
  Administered 2015-09-24: 30 mg via SUBCUTANEOUS
  Filled 2015-09-24: qty 0.3

## 2015-09-24 NOTE — Progress Notes (Signed)
Pt has discharge order. RN notifies daughter for clothing for discharge. Daughter is unaware of discharge order and wants patient to have a PT consult prior to discharge. MD notified. Order for PT consult and suspend discharge order received. I will continue assess.

## 2015-09-24 NOTE — Progress Notes (Signed)
Pharmacy Note - Anticoagulation  Patient ordered enoxaparin 40mg  SQ Q24H for VTE prophylaxis  Estimated Creatinine Clearance: 27.4 mL/min (by C-G formula based on Cr of 1.65).  Will adjust to enoxaparin 30mg  SQ Q24H for CrCl < 5930ml/min  Garlon HatchetJody Dustyn Dansereau, PharmD Clinical Pharmacist  09/24/2015 10:18 AM

## 2015-09-24 NOTE — Progress Notes (Signed)
Initial Heart Failure Clinic appointment scheduled on Oct 04, 2015 at 9:00am. Thank you.

## 2015-09-24 NOTE — Discharge Summary (Signed)
Sally Curry, 75 y.o., DOB 1941-05-24, MRN [id number]. 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 female with a known history of CHF. She has been having weight gain about 20 pounds over past week. She's been short of breath. Worsening swelling of the legs. The patient stated that she saw her PMD who sent her here to the hospital. Patient was admitted for acute on chronic systolic of exasperation. She was started on therapy with IV Lasix. Her creatinine did worsen little bit and therefore the Lasix was discontinued. She is feeling much better. She will be resumed on her oral regimen. Repeat echocardiogram of the heart showed moderate aortic regurg. She also was noted to be anemic with a drop in her hemoglobin her blood work reveals her to likely have anemia of chronic disease. Patient needs to have her hemoglobin monitored by her primary care provider. If continues to below knees to be referred to GI and hematology.           Consults  None  Significant Tests:  See full reports for all details      Dg Chest 2 View  09/22/2015  CLINICAL DATA:  Shortness of breath started last night. Increased swelling in bilateral feet. EXAM: CHEST  2 VIEW COMPARISON:  08/16/2015 FINDINGS: Two views of the chest demonstrate a left dual chamber cardiac pacemaker which appears to be stable. There is cardiomegaly. Prominent interstitial lung markings bilaterally are concerning for pulmonary edema. No large pleural effusions. No acute bone  abnormalities. IMPRESSION: Cardiomegaly with evidence for interstitial pulmonary edema. Electronically Signed   By: Richarda Overlie M.D.   On: 09/22/2015 17:41       Today   Subjective:   Sally Curry  issue feels much better breathing much improved  Objective:   Blood pressure 123/45, pulse 66, temperature 97.9 F (36.6 C), temperature source Oral, resp. rate 20, height 5' (1.524 m), weight 74.844 kg (165 lb), SpO2 95 %.  .  Intake/Output Summary (Last 24 hours) at 09/24/15 1451 Last data filed at 09/24/15 0900  Gross per 24 hour  Intake      0 ml  Output    606 ml  Net   -606 ml    Exam VITAL SIGNS: Blood pressure 123/45, pulse 66, temperature 97.9 F (36.6 C), temperature source Oral, resp. rate 20, height 5' (1.524 m), weight 74.844 kg (165 lb), SpO2 95 %.  GENERAL:  75 y.o.-year-old patient lying in the bed with no acute distress.  EYES: Pupils equal, round, reactive to light and accommodation. No scleral icterus. Extraocular muscles intact.  HEENT: Head atraumatic, normocephalic. Oropharynx and nasopharynx clear.  NECK:  Supple, no jugular venous distention. No thyroid enlargement, no tenderness.  LUNGS: Normal breath sounds bilaterally, no wheezing, rales,rhonchi or crepitation. No use of accessory muscles of respiration.  CARDIOVASCULAR: S1, S2 normal. No murmurs, rubs, or gallops.  ABDOMEN: Soft, nontender, nondistended. Bowel sounds present. No organomegaly or mass.  EXTREMITIES: No pedal edema, cyanosis, or clubbing.  NEUROLOGIC: Cranial nerves II through XII are intact. Muscle strength 5/5 in all  extremities. Sensation intact. Gait not checked.  PSYCHIATRIC: The patient is alert and oriented x 3.  SKIN: No obvious rash, lesion, or ulcer.   Data Review     CBC w Diff: Lab Results  Component Value Date   WBC 8.4 09/24/2015   WBC 9.7 01/19/2014   HGB 8.3* 09/24/2015   HGB 8.3* 01/19/2014   HCT 26.2* 09/24/2015   HCT 25.5* 01/19/2014   PLT 262 09/24/2015   PLT  173 01/19/2014   LYMPHOPCT 19 08/16/2015   LYMPHOPCT 5.6 01/19/2014   MONOPCT 16 08/16/2015   MONOPCT 1.3 01/19/2014   EOSPCT 1 08/16/2015   EOSPCT 0.0 01/19/2014   BASOPCT 1 08/16/2015   BASOPCT 0.1 01/19/2014   CMP: Lab Results  Component Value Date   NA 140 09/24/2015   NA 142 01/19/2014   K 4.2 09/24/2015   K 4.0 01/19/2014   CL 109 09/24/2015   CL 114* 01/19/2014   CO2 22 09/24/2015   CO2 18* 01/19/2014   BUN 32* 09/24/2015   BUN 39* 01/19/2014   CREATININE 1.65* 09/24/2015   CREATININE 1.37* 01/19/2014   PROT 5.9* 06/04/2015   PROT 7.6 12/09/2012   ALBUMIN 3.2* 06/04/2015   ALBUMIN 3.8 12/09/2012   BILITOT 0.7 06/04/2015   BILITOT 0.5 12/09/2012   ALKPHOS 91 06/04/2015   ALKPHOS 152* 12/09/2012   AST 17 06/04/2015   AST 20 12/09/2012   ALT 11* 06/04/2015   ALT 17 12/09/2012  .  Micro Results No results found for this or any previous visit (from the past 240 hour(s)).      Code Status Orders        Start     Ordered   09/22/15 2029  Full code   Continuous     09/22/15 2029    Code Status History    Date Active Date Inactive Code Status Order ID Comments User Context   06/04/2015  4:30 AM 06/07/2015  7:22 PM Full Code [id number]  Alberteen Samhristopher P Danford, MD Inpatient   03/16/2015  7:23 PM 03/19/2015  7:52 PM Full Code [id number]  Gale Journeyatherine P Walsh, MD Inpatient          Follow-up Information    Follow up with Singh,Jasmine, MD. Go on 09/30/2015.   Specialty:  Internal Medicine   Why:  at 3:00pm.    Contact information:   [address] [facility] [place] [address] [phone]       Follow up with Dalia HeadingFATH,KENNETH A., MD. Nyra CapesGo on 10/04/2015.   Specialty:  Cardiology   Why:  at 3:30pm.    Contact information:   [address] [place] [address] [phone]       Follow up with Delma Freezeina A Hackney, FNP. Go on 10/04/2015.   Specialty:  Family Medicine   Why:  at 9:00am , to the Heart Failure Clinic   Contact information:   [address] [place] [address] [phone]       [facility].   Why:  They will make Home visit tomorrow    Contact information:   [address] [place] [address] [phone]       Discharge Medications     Medication List    TAKE these medications        allopurinol 100 MG tablet  Commonly known as:  ZYLOPRIM  Take 100 mg by mouth daily.     amLODipine 2.5 MG tablet  Commonly  known as:  NORVASC  Take 1 tablet (2.5 mg total) by mouth daily.     aspirin EC 81 MG tablet  Take 81 mg by mouth daily.     cholecalciferol 1000 units tablet  Commonly known as:  VITAMIN D  Take 1,000 Units by mouth daily.     clopidogrel 75 MG tablet  Commonly known as:  PLAVIX  Take 75 mg by mouth daily.     folic acid 1 MG tablet  Commonly known as:  FOLVITE  Take 1 mg by mouth daily.     furosemide 20 MG tablet  Commonly known as:  LASIX  Take 1 tablet (20 mg total) by mouth daily.     gemfibrozil 600 MG tablet  Commonly known as:  LOPID  Take 600 mg by mouth 2 (two) times daily before a meal.     hydrALAZINE 100 MG tablet  Commonly known as:  APRESOLINE  Take 100 mg by mouth 3 (three) times daily.     Iron (Ferrous Sulfate) 142 (45 Fe) MG Tbcr  Take 142 mg by mouth 2 (two) times daily.     isosorbide mononitrate 60 MG 24 hr tablet  Commonly known as:  IMDUR  Take 60 mg by mouth daily.     lovastatin 40 MG tablet  Commonly known as:  MEVACOR  Take 40 mg by mouth at bedtime.     Melatonin 3 MG Tabs  Take 3 mg by mouth at bedtime as needed (for sleep).     metoprolol succinate 50 MG 24 hr tablet  Commonly known as:  TOPROL-XL  Take 50 mg by mouth daily. Take with or immediately following a meal.     multivitamin with minerals Tabs tablet  Take 1 tablet by mouth daily.     nystatin cream  Commonly known as:  MYCOSTATIN  Apply 1 application topically 2 (two) times daily as needed (for rash/itching).      omeprazole 40 MG capsule  Commonly known as:  PRILOSEC  Take 40 mg by mouth daily.     ondansetron 4 MG tablet  Commonly known as:  ZOFRAN  Take 4 mg by mouth every 8 (eight) hours as needed for nausea or vomiting.     PARoxetine 40 MG tablet  Commonly known as:  PAXIL  Take 40 mg by mouth at bedtime.     pilocarpine 1 % ophthalmic solution  Commonly known as:  PILOCAR  Place 1 drop into both eyes 4 (four) times daily.     potassium chloride SA 20 MEQ tablet  Commonly known as:  K-DUR,KLOR-CON  Take 1 tablet (20 mEq total) by mouth 2 (two) times daily.     senna 8.6 MG tablet  Commonly known as:  SENOKOT  Take 1 tablet by mouth at bedtime as needed for constipation.           Total Time in preparing paper work, data evaluation and todays exam - 35 minutes  Auburn Bilberry M.D on 09/24/2015 at 2:51 PM  [facility]   Office  [phone]

## 2015-09-24 NOTE — Discharge Instructions (Signed)
Heart Failure Clinic appointment on Oct 04, 2015 at 9:00am with Sally Kindredina Liliani Bobo, FNP. Please call [phone] to reschedule.    DIET:  Cardiac diet  DISCHARGE CONDITION:  Stable  ACTIVITY:  Activity as tolerated  OXYGEN:  Home Oxygen: No.   Oxygen Delivery: room air  DISCHARGE LOCATION:  home    ADDITIONAL DISCHARGE INSTRUCTION:   If you experience worsening of your admission symptoms, develop shortness of breath, life threatening emergency, suicidal or homicidal thoughts you must seek medical attention immediately by calling 911 or calling your MD immediately  if symptoms less severe.  You Must read complete instructions/literature along with all the possible adverse reactions/side effects for all the Medicines you take and that have been prescribed to you. Take any new Medicines after you have completely understood and accpet all the possible adverse reactions/side effects.   Please note  You were cared for by a hospitalist during your hospital stay. If you have any questions about your discharge medications or the care you received while you were in the hospital after you are discharged, you can call the unit and asked to speak with the hospitalist on call if the hospitalist that took care of you is not available. Once you are discharged, your primary care physician will handle any further medical issues. Please note that NO REFILLS for any discharge medications will be authorized once you are discharged, as it is imperative that you return to your primary care physician (or establish a relationship with a primary care physician if you do not have one) for your aftercare needs so that they can reassess your need for medications and monitor your lab values.

## 2015-09-24 NOTE — Progress Notes (Signed)
Pt remains alert and oriented, no complaints of pain, SR on telemetry. OOB to chair this shift. One assist to bedside commode. Incision on abdomen is well approximated. No drainage noted.

## 2015-09-24 NOTE — Care Management (Signed)
Notified [facility] of discharge. Faxed discharge HH orders. Updated patient.

## 2015-09-25 NOTE — Evaluation (Signed)
Physical Therapy Evaluation Patient Details Name: Sally Curry MRN: [id number] DOB: November 17, 1940 Today's Date: 09/25/2015   History of Present Illness  75 yo female with onset of weakness after recent SNF admission, CHF exacerbation and mitral valve regurg.  BP elevated, anemic, has CKD.  PMHx:  I and D to abdomen with incision, groin fungal infection, CKD, depression, glaucoma.  Clinical Impression  Pt is in with SNF as previously and will be reassessed there this weekend for mobility and preparation to transition home ASAP.  Pt is being cared for by daughter at home and will benefit to decrease her need for supplemental O2 and to increase independence with stairs and walker back to PLOF.    Follow Up Recommendations SNF    Equipment Recommendations  Rolling walker with 5" wheels (if pt does not have one)    Recommendations for Other Services Rehab consult     Precautions / Restrictions Precautions Precautions: Fall;ICD/Pacemaker (telemetry) Precaution Comments: abd incision Restrictions Weight Bearing Restrictions: No      Mobility  Bed Mobility Overal bed mobility: Needs Assistance Bed Mobility: Supine to Sit;Sit to Supine     Supine to sit: Min assist Sit to supine: Min assist   General bed mobility comments: pt cannot lift her legs into bed fully and needs extra help  Transfers Overall transfer level: Needs assistance Equipment used: Rolling walker (2 wheeled);1 person hand held assist (O2 sats checked and was 93% premobility) Transfers: Sit to/from [facility] Sit to Stand: Min assist Stand pivot transfers: Min assist       General transfer comment: cues for sequence and safety  Ambulation/Gait Ambulation/Gait assistance: Min assist (listing to R side) Ambulation Distance (Feet): 100 Feet Assistive device: Rolling walker (2 wheeled);1 person hand held assist Gait Pattern/deviations: Step-to pattern;Decreased dorsiflexion - right;Wide base of  support;Drifts right/left (R foot lands on her toes) Gait velocity: reduced Gait velocity interpretation: Below normal speed for age/gender General Gait Details: R foot drop  Stairs            Wheelchair Mobility    Modified Rankin (Stroke Patients Only) Modified Rankin (Stroke Patients Only) Pre-Morbid Rankin Score: Moderate disability Modified Rankin: Moderately severe disability     Balance Overall balance assessment: Needs assistance Sitting-balance support: Feet supported Sitting balance-Leahy Scale: Fair   Postural control: Posterior lean Standing balance support: Bilateral upper extremity supported Standing balance-Leahy Scale: Poor                               Pertinent Vitals/Pain Pain Assessment: No/denies pain    Home Living Family/patient expects to be discharged to:: Private residence Living Arrangements: Children Available Help at Discharge: Family;Available PRN/intermittently (daughter works 7-3) Type of Home: House Home Access: Level entry     Home Layout: Two level;1/2 bath on main level Home Equipment: Walker - 2 wheels;Walker - 4 wheels;Tub bench;Grab bars - tub/shower;Wheelchair - manual      Prior Function Level of Independence: Independent with assistive device(s)         Comments: uses rollator all the time     Hand Dominance   Dominant Hand: Left    Extremity/Trunk Assessment   Upper Extremity Assessment: Generalized weakness;RUE deficits/detail (R hand was assisted to open to grasp walker)           Lower Extremity Assessment: Generalized weakness      Cervical / Trunk Assessment: Normal  Communication   Communication: No difficulties  Cognition Arousal/Alertness: Awake/alert Behavior During Therapy: WFL for tasks assessed/performed Overall Cognitive Status: Within Functional Limits for tasks assessed                      General Comments General comments (skin integrity, edema, etc.): Pt  was seen for PT with ck of O2 sats with 93% and post mobility was 87% with slow recovery on room air to 90%.      Exercises        Assessment/Plan    PT Assessment All further PT needs can be met in the next venue of care  PT Diagnosis Difficulty walking;Generalized weakness;Hemiplegia non-dominant side   PT Problem List Decreased strength;Decreased range of motion;Decreased activity tolerance;Decreased balance;Decreased mobility;Decreased coordination;Decreased knowledge of use of DME;Decreased safety awareness;Cardiopulmonary status limiting activity;Decreased skin integrity  PT Treatment Interventions     PT Goals (Current goals can be found in the Care Plan section) Acute Rehab PT Goals Patient Stated Goal: to get home PT Goal Formulation: With patient/family Time For Goal Achievement: 10/09/15 Potential to Achieve Goals: Good    Frequency     Barriers to discharge        Co-evaluation               End of Session Equipment Utilized During Treatment: Gait belt Activity Tolerance: Patient tolerated treatment well;Patient limited by fatigue;Other (comment) (low O2 sats with SOB) Patient left: in bed;with call bell/phone within reach Nurse Communication: Mobility status (discharge recommendations)         Time: 2952-8413 PT Time Calculation (min) (ACUTE ONLY): 22 min   Charges:   PT Evaluation $PT Eval Moderate Complexity: 1 Procedure     PT G CodesRamond Dial 10-Oct-2015, 10:47 AM   Mee Hives, PT MS Acute Rehab Dept. Number: [facility] [id number] and [place] [phone]

## 2015-09-25 NOTE — Care Management Note (Addendum)
Case Management Note  Patient Details  Name: Sally Curry MRN: [id number] Date of Birth: 1940/06/29  Subjective/Objective:   Curry referral for home health RN, PT, SLP, OT, and nurse aid [facility].  Discussed discharge planning with SW who reports that Sally Curry will be transported to [facility] today and will transition from [facility] to home with [facility]. Daughter is aware and is agreement with this discharge plan.                Action/Plan:   Expected Discharge Date:                  Expected Discharge Plan:  Home w Home Health Services  In-House Referral:     Discharge planning Services  CM Consult  Post Acute Care Choice:  Resumption of Svcs/PTA Provider Choice offered to:     DME Arranged:    DME Agency:     HH Arranged:    HH Agency:  [facility]  Status of Service:  In process, will continue to follow  Medicare Important Message Given:    Date Medicare IM Given:    Medicare IM give by:    Date Additional Medicare IM Given:    Additional Medicare Important Message give by:     If discussed at Long Length of Stay Meetings, dates discussed:    Additional Comments:  Sally Tober A, RN 09/25/2015, 8:35 AM

## 2015-09-25 NOTE — Clinical Social Work Note (Signed)
Clinical Social Work Assessment  Patient Details  Name: Sally Curry MRN: [id number] Date of Birth: 1940/06/15  Date of referral:  09/25/15               Reason for consult:  Facility Placement                Permission sought to share information with:  Family Supports Permission granted to share information::  Yes, Verbal Permission Granted  Name::     Randall An, daughter  Housing/Transportation Living arrangements for the past 2 months:  [place], [place] of Information:  Patient, Adult Children Patient Interpreter Needed:  None Criminal Activity/Legal Involvement Pertinent to Current Situation/Hospitalization:  No - Comment as needed Significant Relationships:  Adult Children, Other Family Members Lives with:  Self, Adult Children Do you feel safe going back to the place where you live?  Yes Need for family participation in patient care:  Yes (Comment)  Care giving concerns:  No care giving concerns identified.   Social Worker assessment / plan:  CSW was consulted as PT recommended SNF. Pt is ready for discharge. CSW spoke with pt's daughter on the phone. CSW met with pt to address consult. CSW introduced herself and explained role of social work. CSW also explained the process of discharging to SNF. Pt was recently discharged from [facility]. Pt's daughter would like for pt to return to facility. CSW followed up with [facility], and pt is able to return. CSW updated MD on discharge plan, as well as RNCM.  Pt and pt's daughter are aware and agreeable to discharge plan. CSW provided support around returning to facility. CSW sent all discharge information to facility and they are ready to admit pt. RN called report and EMS will provide transportation. CSW is signing off as no further needs identified.  Employment status:  Retired Forensic scientist:  Medicare PT Recommendations:  [place] / Referral to community  resources:  Other (Comment Required) ([place] for [facility])  Patient/Family's Response to care:  Pt and pt's daughter were appreciative of CSW support.   Patient/Family's Understanding of and Emotional Response to Diagnosis, Current Treatment, and Prognosis:  Pt knowns that returning to STR would be beneficial for her, however she was hoping to return home.   Emotional Assessment Appearance:  Appears stated age Attitude/Demeanor/Rapport:  Other (Approriate) Affect (typically observed):  Pleasant Orientation:  Oriented to Self, Oriented to Place, Oriented to  Time, Oriented to Situation Alcohol / Substance use:  Never Used Psych involvement (Current and /or in the community):  No (Comment)  Discharge Needs  Concerns to be addressed:  Adjustment to Illness Readmission within the last 30 days:  No Current discharge risk:  Chronically ill Barriers to Discharge:  No Barriers Identified   Darden Dates, LCSW 09/25/2015, 11:15 AM

## 2015-09-25 NOTE — Discharge Summary (Addendum)
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 CAD (coronary artery disease)   . Hypertension   . Stroke (HCC)   . Gout   . PVD (peripheral vascular disease) (HCC)   . Anemia   . Chronic kidney disease   . Anxiety   . CHF (congestive heart failure) (HCC)   . Depression   . Glaucoma   . Presence of permanent cardiac pacemaker   . Renal insufficiency     HOSPITAL COURSE:  75 year old white female presenting with weight gain and shortness (  1. Acute on chronic diastolic congestive heart failure with mild to moderate mitral regurgitation.  -received IV Lasix and diuresed well - Her creatinine has bumped up a little. However that is close to her baseline. Repeat echo showed EF 60% and moderate AR  2. Accelerated hypertension blood pressure improved this morning. Continue amlodipine and hydralazine and Toprol-XL. She is also on Imdur.  3. Fungal infection groin continue nystatin cream  4. Anemia of chronic dz. hgb 8.3 -cont MVI, ferrous sulfate and po folic acid -defer to PCP for outpt f/u  5. Chronic kidney disease stage II- renal function little elevated monitor GFR  6. Depression- continue Paxil  7. Glaucoma unspecified continue eyedrops  PT saw pt and recomends STR. Pt will d/c to [facility] today.  CONSULTS OBTAINED:     DRUG ALLERGIES:  No Known  Allergies  DISCHARGE MEDICATIONS:   Current Discharge Medication List    START taking these medications   Details  Iron, Ferrous Sulfate, 142 (45 Fe) MG TBCR Take 142 mg by mouth 2 (two) times daily. Qty: 60 tablet, Refills: 0      CONTINUE these medications which have NOT CHANGED   Details  allopurinol (ZYLOPRIM) 100 MG tablet Take 100 mg by mouth daily.    amLODipine (NORVASC) 2.5 MG tablet Take 1 tablet (2.5 mg total) by mouth daily. Qty: 30 tablet, Refills: 3    aspirin EC 81 MG tablet Take 81 mg by mouth daily.    cholecalciferol (VITAMIN D) 1000 units tablet Take 1,000 Units by mouth daily.    clopidogrel (PLAVIX) 75 MG tablet Take 75 mg by mouth daily.    folic acid (FOLVITE) 1 MG tablet Take 1 mg by mouth daily.    furosemide (LASIX) 20 MG tablet Take 1 tablet (20 mg total) by mouth daily. Qty: 30 tablet    gemfibrozil (LOPID) 600 MG tablet Take 600 mg by mouth 2 (two) times daily before a meal.    hydrALAZINE (APRESOLINE) 100 MG tablet Take 100 mg by mouth 3 (three) times daily.    isosorbide mononitrate (IMDUR) 60 MG 24 hr tablet Take 60 mg by mouth daily.    lovastatin (MEVACOR) 40 MG tablet Take 40 mg by mouth at bedtime.    Melatonin 3 MG TABS Take 3 mg by mouth at bedtime as needed (for sleep).  metoprolol succinate (TOPROL-XL) 50 MG 24 hr tablet Take 50 mg by mouth daily. Take with or immediately following a meal.    Multiple Vitamin (MULTIVITAMIN WITH MINERALS) TABS tablet Take 1 tablet by mouth daily.    nystatin cream (MYCOSTATIN) Apply 1 application topically 2 (two) times daily as needed (for rash/itching).     omeprazole (PRILOSEC) 40 MG capsule Take 40 mg by mouth daily.    ondansetron (ZOFRAN) 4 MG tablet Take 4 mg by mouth every 8 (eight) hours as needed for nausea or vomiting.    PARoxetine (PAXIL) 40 MG tablet Take 40 mg by mouth at bedtime.    pilocarpine (PILOCAR) 1 % ophthalmic solution Place 1 drop into both eyes 4 (four) times  daily.    potassium chloride SA (K-DUR,KLOR-CON) 20 MEQ tablet Take 1 tablet (20 mEq total) by mouth 2 (two) times daily.    senna (SENOKOT) 8.6 MG tablet Take 1 tablet by mouth at bedtime as needed for constipation.        If you experience worsening of your admission symptoms, develop shortness of breath, life threatening emergency, suicidal or homicidal thoughts you must seek medical attention immediately by calling 911 or calling your MD immediately  if symptoms less severe.  You Must read complete instructions/literature along with all the possible adverse reactions/side effects for all the Medicines you take and that have been prescribed to you. Take any new Medicines after you have completely understood and accept all the possible adverse reactions/side effects.   Please note  You were cared for by a hospitalist during your hospital stay. If you have any questions about your discharge medications or the care you received while you were in the hospital after you are discharged, you can call the unit and asked to speak with the hospitalist on call if the hospitalist that took care of you is not available. Once you are discharged, your primary care physician will handle any further medical issues. Please note that NO REFILLS for any discharge medications will be authorized once you are discharged, as it is imperative that you return to your primary care physician (or establish a relationship with a primary care physician if you do not have one) for your aftercare needs so that they can reassess your need for medications and monitor your lab values. Today   SUBJECTIVE  Doing well   VITAL SIGNS:  Blood pressure 175/54, pulse 63, temperature 97.9 F (36.6 C), temperature source Oral, resp. rate 21, height 5' (1.524 m), weight 74.844 kg (165 lb), SpO2 95 %.  I/O:   Intake/Output Summary (Last 24 hours) at 09/25/15 0728 Last data filed at 09/25/15 0630  Gross per 24 hour  Intake      0  ml  Output    531 ml  Net   -531 ml    PHYSICAL EXAMINATION:  GENERAL:  75 y.o.-year-old patient lying in the bed with no acute distress.  EYES: Pupils equal, round, reactive to light and accommodation. No scleral icterus. Extraocular muscles intact.  HEENT: Head atraumatic, normocephalic. Oropharynx and nasopharynx clear.  NECK:  Supple, no jugular venous distention. No thyroid enlargement, no tenderness.  LUNGS: Normal breath sounds bilaterally, no wheezing, rales,rhonchi or crepitation. No use of accessory muscles of respiration.  CARDIOVASCULAR: S1, S2 normal. No murmurs, rubs, or gallops.  ABDOMEN: Soft, non-tender, non-distended. Bowel sounds present. No organomegaly or mass.  EXTREMITIES: No pedal edema, cyanosis, or clubbing.  NEUROLOGIC: Cranial nerves II through XII are intact. Muscle strength  5/5 in all extremities. Sensation intact. Gait not checked.  PSYCHIATRIC: The patient is alert and oriented x 3.  SKIN: No obvious rash, lesion, or ulcer.   DATA REVIEW:   CBC   Recent Labs Lab 09/24/15 0438  WBC 8.4  HGB 8.3*  HCT 26.2*  PLT 262    Chemistries   Recent Labs Lab 09/24/15 0438  NA 140  K 4.2  CL 109  CO2 22  GLUCOSE 105*  BUN 32*  CREATININE 1.65*  CALCIUM 8.9    Microbiology Results   No results found for this or any previous visit (from the past 240 hour(s)).  RADIOLOGY:  No results found.   Management plans discussed with the patient, family and they are in agreement.  CODE STATUS:     Code Status Orders        Start     Ordered   09/22/15 2029  Full code   Continuous     09/22/15 2029    Code Status History    Date Active Date Inactive Code Status Order ID Comments User Context   06/04/2015  4:30 AM 06/07/2015  7:22 PM Full Code [id number]  Alberteen Sam, MD Inpatient   03/16/2015  7:23 PM 03/19/2015  7:52 PM Full Code [id number]  Gale Journey, MD Inpatient      TOTAL TIME TAKING CARE OF THIS PATIENT: .     Ikeisha Blumberg M.D on 09/25/2015 at 7:28 AM  Between 7am to 6pm - Pager - [phone] After 6pm go to [email or web address] - password [email or web address] [facility]  [place] [facility]  Office  [phone]  CC: Primary care physician; Clide Dales, FNP

## 2015-09-25 NOTE — Progress Notes (Signed)
A & O. Belongings packed up. Report called to Meghan at [facility]. [place] CampbellJaimie was made aware of tx to [facility], EMS called. IV and tele removed. Pt has no further concerns at this time.

## 2015-09-25 NOTE — [place] FL2 (Signed)
[place] MEDICAID FL2 LEVEL OF CARE SCREENING TOOL     IDENTIFICATION  Patient Name: Sally Curry Birthdate: 1940/10/30 Sex: female Admission Date (Current Location): 09/22/2015  [place]   Facility and Address:  [facility], [address], [place], [place] [address]      Provider Number: [id number]  Attending Physician Name and Address:  Enedina FinnerSona Patel, MD  Relative Name and Phone Number:       Current Level of Care: Hospital Recommended Level of Care: Skilled Nursing Facility Prior Approval Number:    Date Approved/Denied:   PASRR Number: [id number]  Discharge Plan: SNF    Current Diagnoses: Patient Active Problem List   Diagnosis Date Noted  . CHF (congestive heart failure) (HCC) 09/22/2015  . Acute CHF (congestive heart failure) (HCC) 06/04/2015  . Flank pain 06/04/2015  . UTI (lower urinary tract infection) 06/04/2015  . Acute congestive heart failure (HCC)   . Chest pain 03/16/2015  . Duodenal ulcer with hemorrhage 01/20/2014  . Acute lower GI bleeding 01/20/2014  . CAD (coronary artery disease), native coronary artery 01/20/2014  . Essential hypertension, benign 01/20/2014  . CHF with unknown LVEF (HCC) 01/20/2014  . Dyslipidemia 01/20/2014  . Gout of right knee 01/20/2014  . Hypokalemia 01/20/2014  . Glaucoma 01/20/2014  . Depression with anxiety 01/20/2014    Orientation RESPIRATION BLADDER Height & Weight     Self, Time, Situation, Place  O2 (2L) Continent Weight: 165 lb (74.844 kg) Height:  5' (152.4 cm)  BEHAVIORAL SYMPTOMS/MOOD NEUROLOGICAL BOWEL NUTRITION STATUS      Continent Diet (2 grams sodium, thin liquids)  AMBULATORY STATUS COMMUNICATION OF NEEDS Skin   Limited Assist Verbally Normal                       Personal Care Assistance Level of Assistance  Bathing, Feeding, Dressing Bathing Assistance: Limited assistance Feeding assistance: Independent Dressing Assistance:  Limited assistance     Functional Limitations Info  Speech, Hearing, Sight Sight Info: Adequate Hearing Info: Adequate Speech Info: Adequate    SPECIAL CARE FACTORS FREQUENCY  PT (By licensed PT)     PT Frequency: 5              Contractures      Additional Factors Info  Code Status, Allergies Code Status Info: DNR Allergies Info: No known allergies           Current Medications (09/25/2015):  This is the current hospital active medication list Current Facility-Administered Medications  Medication Dose Route Frequency Provider Last Rate Last Dose  . acetaminophen (TYLENOL) tablet 650 mg  650 mg Oral Q6H PRN Alford Highlandichard Wieting, MD   650 mg at 09/23/15 [id number]   Or  . acetaminophen (TYLENOL) suppository 650 mg  650 mg Rectal Q6H PRN Alford Highlandichard Wieting, MD      . allopurinol (ZYLOPRIM) tablet 100 mg  100 mg Oral Daily Alford Highlandichard Wieting, MD   100 mg at 09/25/15 0925  . amLODipine (NORVASC) tablet 2.5 mg  2.5 mg Oral Daily Alford Highlandichard Wieting, MD   2.5 mg at 09/25/15 0925  . aspirin EC tablet 81 mg  81 mg Oral Daily Alford Highlandichard Wieting, MD   81 mg at 09/25/15 0925  . cholecalciferol (VITAMIN D) tablet 1,000 Units  1,000 Units Oral Daily Alford Highlandichard Wieting, MD   1,000 Units at 09/25/15 [phone]  . clopidogrel (PLAVIX) tablet 75 mg  75 mg Oral Daily Alford Highlandichard Wieting, MD   75  mg at 09/25/15 0925  . colchicine tablet 0.6 mg  0.6 mg Oral Daily Ihor Austin, MD   0.6 mg at 09/25/15 0925  . enoxaparin (LOVENOX) injection 30 mg  30 mg Subcutaneous Q24H Auburn Bilberry, MD   30 mg at 09/24/15 2117  . ferrous sulfate tablet 325 mg  325 mg Oral BID WC Auburn Bilberry, MD   325 mg at 09/25/15 1096  . folic acid (FOLVITE) tablet 1 mg  1 mg Oral Daily Alford Highland, MD   1 mg at 09/25/15 0926  . gemfibrozil (LOPID) tablet 600 mg  600 mg Oral BID AC Alford Highland, MD   600 mg at 09/25/15 0454  . hydrALAZINE (APRESOLINE) tablet 100 mg  100 mg Oral TID Alford Highland, MD   100 mg at 09/25/15 0926  . isosorbide  mononitrate (IMDUR) 24 hr tablet 60 mg  60 mg Oral Daily Alford Highland, MD   60 mg at 09/25/15 0925  . metoprolol succinate (TOPROL-XL) 24 hr tablet 50 mg  50 mg Oral Daily Alford Highland, MD   50 mg at 09/25/15 0926  . multivitamin with minerals tablet 1 tablet  1 tablet Oral Q supper Alford Highland, MD   1 tablet at 09/24/15 1830  . nystatin cream (MYCOSTATIN) 1 application  1 application Topical TID PRN Alford Highland, MD      . ondansetron [facility]) tablet 4 mg  4 mg Oral Q8H PRN Alford Highland, MD      . pantoprazole (PROTONIX) EC tablet 40 mg  40 mg Oral QAC breakfast Alford Highland, MD   40 mg at 09/25/15 0926  . PARoxetine (PAXIL) tablet 40 mg  40 mg Oral QHS Alford Highland, MD   40 mg at 09/24/15 2117  . pilocarpine (PILOCAR) 1 % ophthalmic solution 1 drop  1 drop Both Eyes QID Alford Highland, MD   1 drop at 09/25/15 1000  . potassium chloride SA (K-DUR,KLOR-CON) CR tablet 20 mEq  20 mEq Oral BID Alford Highland, MD   20 mEq at 09/25/15 0926  . pravastatin (PRAVACHOL) tablet 40 mg  40 mg Oral q1800 Alford Highland, MD   40 mg at 09/24/15 1830  . senna (SENOKOT) tablet 8.6 mg  1 tablet Oral QHS PRN Alford Highland, MD      . sodium chloride flush (NS) 0.9 % injection 3 mL  3 mL Intravenous Q12H Alford Highland, MD   3 mL at 09/25/15 1000     Discharge Medications: Please see discharge summary for a list of discharge medications.  Relevant Imaging Results:  Relevant Lab Results:   Additional Information SSN:  [id number]  Dede Query, LCSW

## 2015-09-25 NOTE — Clinical Social Work Placement (Signed)
   CLINICAL SOCIAL WORK PLACEMENT  NOTE  Date:  09/25/2015  Patient Details  Name: [name] MRN: [id number] Date of Birth: June 15, 1940  Clinical Social Work is seeking post-discharge placement for this patient at the Skilled  Nursing Facility level of care (*CSW will initial, date and re-position this form in  chart as items are completed):  Yes   Patient/family provided with [facility] Clinical Social Work Department's list of facilities offering this level of care within the geographic area requested by the patient (or if unable, by the patient's family).  Yes   Patient/family informed of their freedom to choose among providers that offer the needed level of care, that participate in Medicare, Medicaid or managed care program needed by the patient, have an available bed and are willing to accept the patient.  Yes   Patient/family informed of [facility]'s ownership interest in [facility] and [facility], as well as of the fact that they are under no obligation to receive care at these facilities.  PASRR submitted to EDS on       PASRR number received on       Existing PASRR number confirmed on 09/25/15     FL2 transmitted to all facilities in geographic area requested by pt/family on 09/25/15     FL2 transmitted to all facilities within larger geographic area on       Patient informed that his/her managed care company has contracts with or will negotiate with certain facilities, including the following:        Yes   Patient/family informed of bed offers received.  [facility]     Physician recommends and patient chooses bed at  [facility](SNF)    Patient to be transferred to   on 09/25/15.  Patient to be transferred to facility by [facility]     Patient family notified on 09/25/15 of transfer.  Name of family member notified:  daughter, [name]     PHYSICIAN       Additional Comment:     _______________________________________________ Dede QuerySarah Raymona Boss, LCSW 09/25/2015, 11:12 AM

## 2015-10-04 ENCOUNTER — Ambulatory Visit: Payer: Medicare (Managed Care) | Attending: Family | Admitting: Family

## 2015-10-04 ENCOUNTER — Encounter: Payer: Self-pay | Admitting: Family

## 2015-10-04 VITALS — BP 160/60 | HR 61 | Resp 20 | Ht 60.0 in | Wt 166.0 lb

## 2015-10-04 DIAGNOSIS — R531 Weakness: Secondary | ICD-10-CM | POA: Insufficient documentation

## 2015-10-04 DIAGNOSIS — D649 Anemia, unspecified: Secondary | ICD-10-CM | POA: Insufficient documentation

## 2015-10-04 DIAGNOSIS — I129 Hypertensive chronic kidney disease with stage 1 through stage 4 chronic kidney disease, or unspecified chronic kidney disease: Secondary | ICD-10-CM | POA: Insufficient documentation

## 2015-10-04 DIAGNOSIS — Z7982 Long term (current) use of aspirin: Secondary | ICD-10-CM | POA: Insufficient documentation

## 2015-10-04 DIAGNOSIS — M109 Gout, unspecified: Secondary | ICD-10-CM | POA: Diagnosis not present

## 2015-10-04 DIAGNOSIS — I110 Hypertensive heart disease with heart failure: Secondary | ICD-10-CM | POA: Diagnosis not present

## 2015-10-04 DIAGNOSIS — F329 Major depressive disorder, single episode, unspecified: Secondary | ICD-10-CM | POA: Diagnosis not present

## 2015-10-04 DIAGNOSIS — Z950 Presence of cardiac pacemaker: Secondary | ICD-10-CM | POA: Insufficient documentation

## 2015-10-04 DIAGNOSIS — Z8673 Personal history of transient ischemic attack (TIA), and cerebral infarction without residual deficits: Secondary | ICD-10-CM | POA: Insufficient documentation

## 2015-10-04 DIAGNOSIS — Z9049 Acquired absence of other specified parts of digestive tract: Secondary | ICD-10-CM | POA: Diagnosis not present

## 2015-10-04 DIAGNOSIS — I2510 Atherosclerotic heart disease of native coronary artery without angina pectoris: Secondary | ICD-10-CM | POA: Diagnosis not present

## 2015-10-04 DIAGNOSIS — N189 Chronic kidney disease, unspecified: Secondary | ICD-10-CM | POA: Diagnosis not present

## 2015-10-04 DIAGNOSIS — N289 Disorder of kidney and ureter, unspecified: Secondary | ICD-10-CM | POA: Diagnosis not present

## 2015-10-04 DIAGNOSIS — F419 Anxiety disorder, unspecified: Secondary | ICD-10-CM | POA: Insufficient documentation

## 2015-10-04 DIAGNOSIS — H409 Unspecified glaucoma: Secondary | ICD-10-CM | POA: Insufficient documentation

## 2015-10-04 DIAGNOSIS — Z9889 Other specified postprocedural states: Secondary | ICD-10-CM | POA: Diagnosis not present

## 2015-10-04 DIAGNOSIS — I739 Peripheral vascular disease, unspecified: Secondary | ICD-10-CM | POA: Diagnosis not present

## 2015-10-04 DIAGNOSIS — I5032 Chronic diastolic (congestive) heart failure: Secondary | ICD-10-CM | POA: Diagnosis present

## 2015-10-04 DIAGNOSIS — I6930 Unspecified sequelae of cerebral infarction: Secondary | ICD-10-CM

## 2015-10-04 DIAGNOSIS — Z79899 Other long term (current) drug therapy: Secondary | ICD-10-CM | POA: Diagnosis not present

## 2015-10-04 DIAGNOSIS — I10 Essential (primary) hypertension: Secondary | ICD-10-CM

## 2015-10-04 NOTE — Progress Notes (Signed)
Subjective:    Patient ID: Sally Curry, female    DOB: 12-09-1940, 75 y.o.   MRN: [id number]  Congestive Heart Failure Presents for initial visit. The disease course has been stable. Associated symptoms include edema (very little), fatigue and muscle weakness (right arm). Pertinent negatives include no abdominal pain, chest pain, chest pressure, orthopnea, palpitations or shortness of breath. The symptoms have been stable. Past treatments include beta blockers and salt and fluid restriction. The treatment provided significant relief. Compliance with prior treatments has been good. Her past medical history is significant for anemia, CAD, CVA and HTN. There is no history of DM or DVT.  Hypertension This is a chronic problem. The current episode started more than 1 year ago. The problem has been waxing and waning since onset. Associated symptoms include peripheral edema. Pertinent negatives include no chest pain, headaches, neck pain, palpitations or shortness of breath. There are no associated agents to hypertension. Risk factors for coronary artery disease include post-menopausal state and sedentary lifestyle. Past treatments include beta blockers, calcium channel blockers, diuretics and lifestyle changes. The current treatment provides moderate improvement. Compliance problems include exercise.  Hypertensive end-organ damage includes kidney disease, CAD/MI, CVA, heart failure and PVD.   Past Medical History  Diagnosis Date  . CAD (coronary artery disease)   . Hypertension   . Stroke (HCC)   . Gout   . PVD (peripheral vascular disease) (HCC)   . Anemia   . Chronic kidney disease   . Anxiety   . CHF (congestive heart failure) (HCC)   . Depression   . Glaucoma   . Presence of permanent cardiac pacemaker   . Renal insufficiency     Past Surgical History  Procedure Laterality Date  . Coronary angioplasty with stent placement    . Pacemaker insertion    . Cholecystectomy    . Carotid  endarterectomy    . Percutaneous placement intravascular stent cervical carotid artery    . Appendectomy    . Abdominal surgery      Family History  Problem Relation Age of Onset  . Hypertension Mother   . Parkinson's disease Mother   . Emphysema Father   . Diabetes Sister     Social History  Substance Use Topics  . Smoking status: Never Smoker   . Smokeless tobacco: Never Used  . Alcohol Use: No    No Known Allergies  Prior to Admission medications   Medication Sig Start Date End Date Taking? Authorizing Provider  allopurinol (ZYLOPRIM) 100 MG tablet Take 100 mg by mouth daily.   Yes Historical Provider, MD  amLODipine (NORVASC) 2.5 MG tablet Take 1 tablet (2.5 mg total) by mouth daily. 03/19/15  Yes Leotis Shames, MD  aspirin EC 81 MG tablet Take 81 mg by mouth daily.   Yes Historical Provider, MD  cholecalciferol (VITAMIN D) 1000 units tablet Take 1,000 Units by mouth daily.   Yes Historical Provider, MD  clopidogrel (PLAVIX) 75 MG tablet Take 75 mg by mouth daily.   Yes Historical Provider, MD  folic acid (FOLVITE) 1 MG tablet Take 800 mcg by mouth daily.    Yes Historical Provider, MD  furosemide (LASIX) 20 MG tablet Take 1 tablet (20 mg total) by mouth daily. 06/09/15  Yes Ripudeep Jenna Luo, MD  gemfibrozil (LOPID) 600 MG tablet Take 600 mg by mouth 2 (two) times daily before a meal.   Yes Historical Provider, MD  hydrALAZINE (APRESOLINE) 100 MG tablet Take 100 mg by mouth 3 (three)  times daily.   Yes Historical Provider, MD  Iron, Ferrous Sulfate, 142 (45 Fe) MG TBCR Take 142 mg by mouth 2 (two) times daily. 09/24/15  Yes Auburn BilberryShreyang Patel, MD  isosorbide mononitrate (IMDUR) 60 MG 24 hr tablet Take 60 mg by mouth daily.   Yes Historical Provider, MD  lovastatin (MEVACOR) 40 MG tablet Take 40 mg by mouth at bedtime.   Yes Historical Provider, MD  Melatonin 3 MG TABS Take 3 mg by mouth at bedtime as needed (for sleep).   Yes Historical Provider, MD  metoprolol succinate (TOPROL-XL)  50 MG 24 hr tablet Take 50 mg by mouth daily. Take with or immediately following a meal.   Yes Historical Provider, MD  Multiple Vitamin (MULTIVITAMIN WITH MINERALS) TABS tablet Take 1 tablet by mouth daily.   Yes Historical Provider, MD  nystatin cream (MYCOSTATIN) Apply 1 application topically 2 (two) times daily as needed (for rash/itching).    Yes Historical Provider, MD  omeprazole (PRILOSEC) 40 MG capsule Take 40 mg by mouth daily.   Yes Historical Provider, MD  ondansetron (ZOFRAN) 4 MG tablet Take 4 mg by mouth every 8 (eight) hours as needed for nausea or vomiting.   Yes Historical Provider, MD  PARoxetine (PAXIL) 40 MG tablet Take 40 mg by mouth at bedtime.   Yes Historical Provider, MD  pilocarpine (PILOCAR) 1 % ophthalmic solution Place 1 drop into both eyes 4 (four) times daily.   Yes Historical Provider, MD  potassium chloride SA (K-DUR,KLOR-CON) 20 MEQ tablet Take 1 tablet (20 mEq total) by mouth 2 (two) times daily. 06/09/15  Yes Ripudeep Jenna LuoK Rai, MD  senna (SENOKOT) 8.6 MG tablet Take 1 tablet by mouth at bedtime as needed for constipation.   Yes Historical Provider, MD      Review of Systems  Constitutional: Positive for fatigue. Negative for appetite change.  HENT: Negative for congestion, postnasal drip and sore throat.   Eyes: Negative.   Respiratory: Negative for cough, chest tightness and shortness of breath.   Cardiovascular: Negative for chest pain, palpitations and leg swelling.  Gastrointestinal: Negative for abdominal pain and abdominal distention.  Endocrine: Negative.   Genitourinary: Negative.   Musculoskeletal: Positive for muscle weakness (right arm). Negative for back pain and neck pain.  Skin: Negative.   Allergic/Immunologic: Negative.   Neurological: Positive for weakness (right arm). Negative for dizziness, light-headedness and headaches.  Hematological: Negative for adenopathy. Bruises/bleeds easily.  Psychiatric/Behavioral: Negative for sleep  disturbance (sleeping on 1 pillow) and dysphoric mood. The patient is not nervous/anxious.        Objective:   Physical Exam  Constitutional: She is oriented to person, place, and time. She appears well-developed and well-nourished.  HENT:  Head: Normocephalic and atraumatic.  Eyes: Conjunctivae are normal. Pupils are equal, round, and reactive to light.  Neck: Normal range of motion. Neck supple.  Cardiovascular: Normal rate and regular rhythm.   Pulmonary/Chest: Effort normal. She has no wheezes. She has no rales.  Abdominal: Soft. She exhibits no distension. There is no tenderness.  Musculoskeletal: She exhibits edema (trace amount pedal edema in bilateral ankles). She exhibits no tenderness.  Neurological: She is alert and oriented to person, place, and time.  Skin: Skin is warm and dry.  Psychiatric: She has a normal mood and affect. Her behavior is normal. Thought content normal.  Nursing note and vitals reviewed.   BP 160/60 mmHg  Pulse 61  Resp 20  Ht 5' (1.524 m)  Wt 166 lb (75.297  kg)  BMI 32.42 kg/m2  SpO2 92%       Assessment & Plan:  1: Chronic heart failure with preserved ejection fraction- Patient presents with minimal fatigue (Class II) upon exertion which she says quickly improves with rest. She denies any shortness of breath nor difficulty sleeping. She has minimal swelling in her ankles. She says that she gets weighed about every other day at [facility]. An order was written for her to get weighed daily and for the provider to be called for an overnight weight gain of >2 pounds or a weekly weight gain of >5 pounds. She says that she adds "a little salt sometimes" to her food and she was encouraged to not add any salt to any foods. The importance of following a 2000mg  sodium diet was discussed and written information about that was given to her. [facility] went in and reviewed medications with the patient.  2: HTN- Blood pressure mildly elevated today but she  says that she was a little nervous since this was her first visit. She says that it usually runs 140's/70's at the facility. Will continue to monitor and adjust medications accordingly. 3: CVA- Patient does have some right arm weakness and does need a little assistance with her ADL's. She did come into the office in a wheelchair.  Medication list was reviewed with the patient.  Return here in 1 month or sooner for any questions/problems before then.

## 2015-10-04 NOTE — Patient Instructions (Signed)
Begin weighing daily and call for an overnight weight gain of > 2 pounds or a weekly weight gain of >5 pounds. 

## 2015-11-04 ENCOUNTER — Ambulatory Visit: Payer: Medicare Other | Admitting: Family

## 2015-11-10 ENCOUNTER — Encounter: Payer: Self-pay | Admitting: Family

## 2015-11-10 ENCOUNTER — Ambulatory Visit: Payer: Medicare Other | Attending: Family | Admitting: Family

## 2015-11-10 VITALS — BP 152/44 | HR 55 | Resp 18 | Ht 60.0 in

## 2015-11-10 DIAGNOSIS — Z950 Presence of cardiac pacemaker: Secondary | ICD-10-CM | POA: Diagnosis not present

## 2015-11-10 DIAGNOSIS — I6930 Unspecified sequelae of cerebral infarction: Secondary | ICD-10-CM

## 2015-11-10 DIAGNOSIS — D649 Anemia, unspecified: Secondary | ICD-10-CM | POA: Diagnosis not present

## 2015-11-10 DIAGNOSIS — I5032 Chronic diastolic (congestive) heart failure: Secondary | ICD-10-CM | POA: Insufficient documentation

## 2015-11-10 DIAGNOSIS — I10 Essential (primary) hypertension: Secondary | ICD-10-CM

## 2015-11-10 DIAGNOSIS — I130 Hypertensive heart and chronic kidney disease with heart failure and stage 1 through stage 4 chronic kidney disease, or unspecified chronic kidney disease: Secondary | ICD-10-CM | POA: Diagnosis not present

## 2015-11-10 DIAGNOSIS — Z79899 Other long term (current) drug therapy: Secondary | ICD-10-CM | POA: Diagnosis not present

## 2015-11-10 DIAGNOSIS — Z833 Family history of diabetes mellitus: Secondary | ICD-10-CM | POA: Diagnosis not present

## 2015-11-10 DIAGNOSIS — M109 Gout, unspecified: Secondary | ICD-10-CM | POA: Insufficient documentation

## 2015-11-10 DIAGNOSIS — R531 Weakness: Secondary | ICD-10-CM | POA: Diagnosis not present

## 2015-11-10 DIAGNOSIS — F329 Major depressive disorder, single episode, unspecified: Secondary | ICD-10-CM | POA: Diagnosis not present

## 2015-11-10 DIAGNOSIS — Z955 Presence of coronary angioplasty implant and graft: Secondary | ICD-10-CM | POA: Insufficient documentation

## 2015-11-10 DIAGNOSIS — Z9049 Acquired absence of other specified parts of digestive tract: Secondary | ICD-10-CM | POA: Insufficient documentation

## 2015-11-10 DIAGNOSIS — I2510 Atherosclerotic heart disease of native coronary artery without angina pectoris: Secondary | ICD-10-CM | POA: Diagnosis not present

## 2015-11-10 DIAGNOSIS — Z8249 Family history of ischemic heart disease and other diseases of the circulatory system: Secondary | ICD-10-CM | POA: Insufficient documentation

## 2015-11-10 DIAGNOSIS — Z7982 Long term (current) use of aspirin: Secondary | ICD-10-CM | POA: Diagnosis not present

## 2015-11-10 DIAGNOSIS — I739 Peripheral vascular disease, unspecified: Secondary | ICD-10-CM | POA: Insufficient documentation

## 2015-11-10 DIAGNOSIS — F419 Anxiety disorder, unspecified: Secondary | ICD-10-CM | POA: Insufficient documentation

## 2015-11-10 DIAGNOSIS — Z836 Family history of other diseases of the respiratory system: Secondary | ICD-10-CM | POA: Diagnosis not present

## 2015-11-10 DIAGNOSIS — N189 Chronic kidney disease, unspecified: Secondary | ICD-10-CM | POA: Diagnosis not present

## 2015-11-10 DIAGNOSIS — H409 Unspecified glaucoma: Secondary | ICD-10-CM | POA: Diagnosis not present

## 2015-11-10 DIAGNOSIS — Z8673 Personal history of transient ischemic attack (TIA), and cerebral infarction without residual deficits: Secondary | ICD-10-CM | POA: Insufficient documentation

## 2015-11-10 NOTE — Patient Instructions (Signed)
Begin weighing daily and call for an overnight weight gain of > 2 pounds or a weekly weight gain of >5 pounds. 

## 2015-11-10 NOTE — Progress Notes (Signed)
Subjective:    Patient ID: Sally Dimmerawn Curry, female    DOB: 1940/12/27, 75 y.o.   MRN: [id number]  Congestive Heart Failure Presents for follow-up visit. The disease course has been stable. Associated symptoms include fatigue. Pertinent negatives include no abdominal pain, chest pain, chest pressure, edema, muscle weakness, orthopnea, palpitations or shortness of breath. The symptoms have been stable. Past treatments include beta blockers and salt and fluid restriction. The treatment provided significant relief. Compliance with prior treatments has been good. Her past medical history is significant for anemia, CAD, CVA and HTN.  Hypertension This is a chronic problem. The current episode started more than 1 year ago. The problem is unchanged. The problem is controlled. Pertinent negatives include no chest pain, headaches, neck pain, palpitations, peripheral edema or shortness of breath. There are no associated agents to hypertension. Risk factors for coronary artery disease include post-menopausal state and sedentary lifestyle. Past treatments include beta blockers, diuretics and lifestyle changes. The current treatment provides moderate improvement. Compliance problems include exercise.  Hypertensive end-organ damage includes kidney disease, CAD/MI and heart failure.    Past Medical History  Diagnosis Date  . CAD (coronary artery disease)   . Hypertension   . Stroke (HCC)   . Gout   . PVD (peripheral vascular disease) (HCC)   . Anemia   . Chronic kidney disease   . Anxiety   . CHF (congestive heart failure) (HCC)   . Depression   . Glaucoma   . Presence of permanent cardiac pacemaker   . Renal insufficiency     Past Surgical History  Procedure Laterality Date  . Coronary angioplasty with stent placement    . Pacemaker insertion    . Cholecystectomy    . Carotid endarterectomy    . Percutaneous placement intravascular stent cervical carotid artery    . Appendectomy    . Abdominal  surgery    ;  Family History  Problem Relation Age of Onset  . Hypertension Mother   . Parkinson's disease Mother   . Emphysema Father   . Diabetes Sister     Social History  Substance Use Topics  . Smoking status: Never Smoker   . Smokeless tobacco: Never Used  . Alcohol Use: No    No Known Allergies  Prior to Admission medications   Medication Sig Start Date End Date Taking? Authorizing Provider  allopurinol (ZYLOPRIM) 100 MG tablet Take 100 mg by mouth daily.   Yes Historical Provider, MD  amLODipine (NORVASC) 2.5 MG tablet Take 1 tablet (2.5 mg total) by mouth daily. 03/19/15  Yes Leotis ShamesJasmine Singh, MD  aspirin EC 81 MG tablet Take 81 mg by mouth daily.   Yes Historical Provider, MD  cholecalciferol (VITAMIN D) 1000 units tablet Take 1,000 Units by mouth daily.   Yes Historical Provider, MD  clopidogrel (PLAVIX) 75 MG tablet Take 75 mg by mouth daily.   Yes Historical Provider, MD  folic acid (FOLVITE) 1 MG tablet Take 800 mcg by mouth daily.    Yes Historical Provider, MD  furosemide (LASIX) 20 MG tablet Take 1 tablet (20 mg total) by mouth daily. 06/09/15  Yes Ripudeep Jenna LuoK Rai, MD  gemfibrozil (LOPID) 600 MG tablet Take 600 mg by mouth 2 (two) times daily before a meal.   Yes Historical Provider, MD  hydrALAZINE (APRESOLINE) 100 MG tablet Take 100 mg by mouth 3 (three) times daily.   Yes Historical Provider, MD  Iron, Ferrous Sulfate, 142 (45 Fe) MG TBCR Take 142 mg by  mouth 2 (two) times daily. 09/24/15  Yes Auburn Bilberry, MD  isosorbide mononitrate (IMDUR) 60 MG 24 hr tablet Take 60 mg by mouth daily.   Yes Historical Provider, MD  lovastatin (MEVACOR) 40 MG tablet Take 40 mg by mouth at bedtime.   Yes Historical Provider, MD  Melatonin 3 MG TABS Take 3 mg by mouth at bedtime as needed (for sleep).   Yes Historical Provider, MD  metoprolol succinate (TOPROL-XL) 50 MG 24 hr tablet Take 50 mg by mouth daily. Take with or immediately following a meal.   Yes Historical Provider, MD   Multiple Vitamin (MULTIVITAMIN WITH MINERALS) TABS tablet Take 1 tablet by mouth daily.   Yes Historical Provider, MD  nystatin cream (MYCOSTATIN) Apply 1 application topically 2 (two) times daily as needed (for rash/itching).    Yes Historical Provider, MD  omeprazole (PRILOSEC) 40 MG capsule Take 40 mg by mouth daily.   Yes Historical Provider, MD  ondansetron (ZOFRAN) 4 MG tablet Take 4 mg by mouth every 8 (eight) hours as needed for nausea or vomiting.   Yes Historical Provider, MD  PARoxetine (PAXIL) 40 MG tablet Take 40 mg by mouth at bedtime.   Yes Historical Provider, MD  pilocarpine (PILOCAR) 1 % ophthalmic solution Place 1 drop into both eyes 4 (four) times daily.   Yes Historical Provider, MD  potassium chloride SA (K-DUR,KLOR-CON) 20 MEQ tablet Take 1 tablet (20 mEq total) by mouth 2 (two) times daily. 06/09/15  Yes Ripudeep Jenna Luo, MD  senna (SENOKOT) 8.6 MG tablet Take 1 tablet by mouth at bedtime as needed for constipation.   Yes Historical Provider, MD     Review of Systems  Constitutional: Positive for fatigue. Negative for appetite change.  HENT: Negative for congestion, postnasal drip and sore throat.   Eyes: Negative.   Respiratory: Negative for cough, chest tightness and shortness of breath.   Cardiovascular: Negative for chest pain, palpitations and leg swelling.  Gastrointestinal: Negative for abdominal pain and abdominal distention.  Endocrine: Negative.   Genitourinary: Negative.   Musculoskeletal: Negative for back pain, muscle weakness and neck pain.  Skin: Negative.   Allergic/Immunologic: Negative.   Neurological: Positive for weakness (right arm). Negative for dizziness, light-headedness and headaches.  Hematological: Negative for adenopathy. Bruises/bleeds easily.  Psychiatric/Behavioral: Negative for sleep disturbance (sleeping on 2 pillows) and dysphoric mood.       Objective:   Physical Exam  Constitutional: She is oriented to person, place, and time.  She appears well-developed and well-nourished.  HENT:  Head: Normocephalic and atraumatic.  Eyes: Conjunctivae are normal. Pupils are equal, round, and reactive to light.  Neck: Normal range of motion. Neck supple.  Cardiovascular: Regular rhythm.  Bradycardia present.   Pulmonary/Chest: Effort normal. She has no wheezes. She has no rales.  Abdominal: Soft. She exhibits no distension. There is no tenderness.  Musculoskeletal: She exhibits no edema or tenderness.  Neurological: She is alert and oriented to person, place, and time.  Skin: Skin is warm and dry.  Psychiatric: She has a normal mood and affect. Her behavior is normal. Thought content normal.  Nursing note and vitals reviewed.   BP 152/44 mmHg  Pulse 55  Resp 18  Ht 5' (1.524 m)  SpO2 95%       Assessment & Plan:  1: Chronic heart failure with preserved ejection fraction- Patient presents with fatigue upon exertion (Class II). She denies any shortness of breath or swelling in her legs or abdomen. She hasn't been  weighing herself but a couple of times a week and says that her last weight was around 160 pounds. Discussed the importance of weighing herself daily and to call for an overnight weight gain of >2 pounds or a weekly weight gain of >5 pounds. She is not adding any salt to her food and tries to follow a low sodium diet.  2: HTN- Blood pressure looks pretty good today. Continue medications at this time. 3: CVA- Patient does have some right arm weakness requiring assistance with some ADL's. Came into the office in a wheelchair.  Patient did not bring her medications nor a list. Each medication was verbally reviewed with the patient and she was encouraged to bring the bottles to every visit to confirm accuracy of list.  Return in 3 months or sooner for any questions/problems before then.

## 2015-11-25 ENCOUNTER — Inpatient Hospital Stay: Payer: Medicare Other

## 2015-11-25 ENCOUNTER — Emergency Department: Payer: Medicare Other

## 2015-11-25 ENCOUNTER — Inpatient Hospital Stay
Admission: EM | Admit: 2015-11-25 | Discharge: 2015-11-27 | DRG: 872 | Disposition: A | Discharge status: Home Health | Payer: Medicare Other | Attending: Internal Medicine | Admitting: Internal Medicine

## 2015-11-25 DIAGNOSIS — I2510 Atherosclerotic heart disease of native coronary artery without angina pectoris: Secondary | ICD-10-CM | POA: Diagnosis present

## 2015-11-25 DIAGNOSIS — A419 Sepsis, unspecified organism: Principal | ICD-10-CM | POA: Diagnosis present

## 2015-11-25 DIAGNOSIS — F329 Major depressive disorder, single episode, unspecified: Secondary | ICD-10-CM | POA: Diagnosis present

## 2015-11-25 DIAGNOSIS — R112 Nausea with vomiting, unspecified: Secondary | ICD-10-CM

## 2015-11-25 DIAGNOSIS — I739 Peripheral vascular disease, unspecified: Secondary | ICD-10-CM | POA: Diagnosis present

## 2015-11-25 DIAGNOSIS — D72829 Elevated white blood cell count, unspecified: Secondary | ICD-10-CM

## 2015-11-25 DIAGNOSIS — D649 Anemia, unspecified: Secondary | ICD-10-CM | POA: Diagnosis present

## 2015-11-25 DIAGNOSIS — I5032 Chronic diastolic (congestive) heart failure: Secondary | ICD-10-CM | POA: Diagnosis present

## 2015-11-25 DIAGNOSIS — N179 Acute kidney failure, unspecified: Secondary | ICD-10-CM | POA: Diagnosis present

## 2015-11-25 DIAGNOSIS — Z950 Presence of cardiac pacemaker: Secondary | ICD-10-CM | POA: Diagnosis not present

## 2015-11-25 DIAGNOSIS — H409 Unspecified glaucoma: Secondary | ICD-10-CM | POA: Diagnosis present

## 2015-11-25 DIAGNOSIS — N189 Chronic kidney disease, unspecified: Secondary | ICD-10-CM

## 2015-11-25 DIAGNOSIS — N390 Urinary tract infection, site not specified: Secondary | ICD-10-CM | POA: Diagnosis present

## 2015-11-25 DIAGNOSIS — Z955 Presence of coronary angioplasty implant and graft: Secondary | ICD-10-CM | POA: Diagnosis not present

## 2015-11-25 DIAGNOSIS — Z8673 Personal history of transient ischemic attack (TIA), and cerebral infarction without residual deficits: Secondary | ICD-10-CM

## 2015-11-25 DIAGNOSIS — N184 Chronic kidney disease, stage 4 (severe): Secondary | ICD-10-CM | POA: Diagnosis present

## 2015-11-25 DIAGNOSIS — I130 Hypertensive heart and chronic kidney disease with heart failure and stage 1 through stage 4 chronic kidney disease, or unspecified chronic kidney disease: Secondary | ICD-10-CM | POA: Diagnosis present

## 2015-11-25 DIAGNOSIS — M109 Gout, unspecified: Secondary | ICD-10-CM | POA: Diagnosis present

## 2015-11-25 DIAGNOSIS — Z79899 Other long term (current) drug therapy: Secondary | ICD-10-CM | POA: Diagnosis not present

## 2015-11-25 DIAGNOSIS — R531 Weakness: Secondary | ICD-10-CM | POA: Diagnosis present

## 2015-11-25 DIAGNOSIS — E860 Dehydration: Secondary | ICD-10-CM | POA: Diagnosis present

## 2015-11-25 DIAGNOSIS — F0390 Unspecified dementia without behavioral disturbance: Secondary | ICD-10-CM | POA: Diagnosis present

## 2015-11-25 DIAGNOSIS — E785 Hyperlipidemia, unspecified: Secondary | ICD-10-CM | POA: Diagnosis present

## 2015-11-25 DIAGNOSIS — F419 Anxiety disorder, unspecified: Secondary | ICD-10-CM | POA: Diagnosis present

## 2015-11-25 DIAGNOSIS — Z7982 Long term (current) use of aspirin: Secondary | ICD-10-CM | POA: Diagnosis not present

## 2015-11-25 DIAGNOSIS — Z7902 Long term (current) use of antithrombotics/antiplatelets: Secondary | ICD-10-CM

## 2015-11-25 DIAGNOSIS — R197 Diarrhea, unspecified: Secondary | ICD-10-CM

## 2015-11-25 DIAGNOSIS — I10 Essential (primary) hypertension: Secondary | ICD-10-CM

## 2015-11-25 LAB — CBC
HEMATOCRIT: 41.8 % (ref 35.0–47.0)
Hemoglobin: 13.4 g/dL (ref 12.0–16.0)
MCH: 29.8 pg (ref 26.0–34.0)
MCHC: 32.1 g/dL (ref 32.0–36.0)
MCV: 92.7 fL (ref 80.0–100.0)
Platelets: 311 10*3/uL (ref 150–440)
RBC: 4.51 MIL/uL (ref 3.80–5.20)
RDW: 15.6 % — AB (ref 11.5–14.5)
WBC: 13.4 10*3/uL — AB (ref 3.6–11.0)

## 2015-11-25 LAB — URINALYSIS COMPLETE WITH MICROSCOPIC (ARMC ONLY)
Bilirubin Urine: NEGATIVE
Glucose, UA: NEGATIVE mg/dL
HGB URINE DIPSTICK: NEGATIVE
Ketones, ur: NEGATIVE mg/dL
Nitrite: NEGATIVE
PROTEIN: 100 mg/dL — AB
SPECIFIC GRAVITY, URINE: 1.011 (ref 1.005–1.030)
pH: 5 (ref 5.0–8.0)

## 2015-11-25 LAB — COMPREHENSIVE METABOLIC PANEL
ALT: 14 U/L (ref 14–54)
AST: 23 U/L (ref 15–41)
Albumin: 4.3 g/dL (ref 3.5–5.0)
Alkaline Phosphatase: 103 U/L (ref 38–126)
Anion gap: 16 — ABNORMAL HIGH (ref 5–15)
BILIRUBIN TOTAL: 0.8 mg/dL (ref 0.3–1.2)
BUN: 68 mg/dL — AB (ref 6–20)
CO2: 17 mmol/L — ABNORMAL LOW (ref 22–32)
CREATININE: 2 mg/dL — AB (ref 0.44–1.00)
Calcium: 10.3 mg/dL (ref 8.9–10.3)
Chloride: 103 mmol/L (ref 101–111)
GFR calc Af Amer: 27 mL/min — ABNORMAL LOW (ref >60)
GFR, EST NON AFRICAN AMERICAN: 24 mL/min — AB (ref >60)
Glucose, Bld: 140 mg/dL — ABNORMAL HIGH (ref 65–99)
POTASSIUM: 4.2 mmol/L (ref 3.5–5.1)
Sodium: 136 mmol/L (ref 135–145)
TOTAL PROTEIN: 8.2 g/dL — AB (ref 6.5–8.1)

## 2015-11-25 LAB — TROPONIN I: Troponin I: <0.03 ng/mL (ref <0.03)

## 2015-11-25 MED ORDER — FOLIC ACID 1 MG PO TABS
1000.0000 ug | ORAL_TABLET | Freq: Every day | ORAL | Status: DC
  Administered 2015-11-26 – 2015-11-27 (×2): 1 mg via ORAL
  Filled 2015-11-25 (×2): qty 1

## 2015-11-25 MED ORDER — MELATONIN 5 MG PO TABS
5.0000 mg | ORAL_TABLET | Freq: Every evening | ORAL | Status: DC | PRN
  Filled 2015-11-25: qty 1

## 2015-11-25 MED ORDER — ONDANSETRON HCL 4 MG/2ML IJ SOLN: 4.0000 mg | Freq: Four times a day (QID) | INTRAMUSCULAR | Status: DC | PRN

## 2015-11-25 MED ORDER — CLOPIDOGREL BISULFATE 75 MG PO TABS
75.0000 mg | ORAL_TABLET | Freq: Every day | ORAL | Status: DC
  Administered 2015-11-25 – 2015-11-27 (×3): 75 mg via ORAL
  Filled 2015-11-25 (×3): qty 1

## 2015-11-25 MED ORDER — DEXTROSE 5 % IV SOLN
1.0000 g | INTRAVENOUS | Status: DC
  Filled 2015-11-25: qty 10

## 2015-11-25 MED ORDER — SODIUM CHLORIDE 0.9 % IV SOLN
INTRAVENOUS | Status: DC
  Administered 2015-11-26: 20:00:00 via INTRAVENOUS

## 2015-11-25 MED ORDER — GEMFIBROZIL 600 MG PO TABS
600.0000 mg | ORAL_TABLET | Freq: Two times a day (BID) | ORAL | Status: DC
  Administered 2015-11-26 – 2015-11-27 (×3): 600 mg via ORAL
  Filled 2015-11-25 (×4): qty 1

## 2015-11-25 MED ORDER — ONDANSETRON HCL 4 MG/2ML IJ SOLN
4.0000 mg | Freq: Once | INTRAMUSCULAR | Status: AC
  Administered 2015-11-25: 4 mg via INTRAVENOUS
  Filled 2015-11-25: qty 2

## 2015-11-25 MED ORDER — HEPARIN SODIUM (PORCINE) 5000 UNIT/ML IJ SOLN
5000.0000 [IU] | Freq: Three times a day (TID) | INTRAMUSCULAR | Status: DC
  Administered 2015-11-25 – 2015-11-27 (×6): 5000 [IU] via SUBCUTANEOUS
  Filled 2015-11-25 (×6): qty 1

## 2015-11-25 MED ORDER — AMLODIPINE BESYLATE 5 MG PO TABS
2.5000 mg | ORAL_TABLET | Freq: Every day | ORAL | Status: DC
  Administered 2015-11-25 – 2015-11-27 (×3): 2.5 mg via ORAL
  Filled 2015-11-25 (×3): qty 1

## 2015-11-25 MED ORDER — ACETAMINOPHEN 325 MG PO TABS: 650.0000 mg | ORAL_TABLET | Freq: Four times a day (QID) | ORAL | Status: DC | PRN

## 2015-11-25 MED ORDER — ACETAMINOPHEN 650 MG RE SUPP: 650.0000 mg | Freq: Four times a day (QID) | RECTAL | Status: DC | PRN

## 2015-11-25 MED ORDER — DEXTROSE 5 % IV SOLN
1.0000 g | Freq: Once | INTRAVENOUS | Status: AC
  Administered 2015-11-25: 1 g via INTRAVENOUS
  Filled 2015-11-25: qty 10

## 2015-11-25 MED ORDER — SODIUM CHLORIDE 0.9% FLUSH: 3.0000 mL | Freq: Two times a day (BID) | INTRAVENOUS | Status: DC

## 2015-11-25 MED ORDER — METOPROLOL SUCCINATE ER 50 MG PO TB24
50.0000 mg | ORAL_TABLET | Freq: Every day | ORAL | Status: DC
  Administered 2015-11-26 – 2015-11-27 (×2): 50 mg via ORAL
  Filled 2015-11-25 (×3): qty 1

## 2015-11-25 MED ORDER — ASPIRIN EC 81 MG PO TBEC
81.0000 mg | DELAYED_RELEASE_TABLET | Freq: Every day | ORAL | Status: DC
  Administered 2015-11-26 – 2015-11-27 (×2): 81 mg via ORAL
  Filled 2015-11-25 (×3): qty 1

## 2015-11-25 MED ORDER — PRAVASTATIN SODIUM 40 MG PO TABS
40.0000 mg | ORAL_TABLET | Freq: Every day | ORAL | Status: DC
  Administered 2015-11-26: 40 mg via ORAL
  Filled 2015-11-25: qty 1

## 2015-11-25 MED ORDER — LABETALOL HCL 5 MG/ML IV SOLN
10.0000 mg | INTRAVENOUS | Status: DC | PRN
  Filled 2015-11-25: qty 4

## 2015-11-25 MED ORDER — PANTOPRAZOLE SODIUM 40 MG PO TBEC
40.0000 mg | DELAYED_RELEASE_TABLET | Freq: Every day | ORAL | Status: DC
  Administered 2015-11-25 – 2015-11-27 (×3): 40 mg via ORAL
  Filled 2015-11-25 (×3): qty 1

## 2015-11-25 MED ORDER — PILOCARPINE HCL 1 % OP SOLN
1.0000 [drp] | Freq: Four times a day (QID) | OPHTHALMIC | Status: DC
  Administered 2015-11-25 – 2015-11-27 (×7): 1 [drp] via OPHTHALMIC
  Filled 2015-11-25: qty 15

## 2015-11-25 MED ORDER — HYDRALAZINE HCL 20 MG/ML IJ SOLN
10.0000 mg | INTRAMUSCULAR | Status: AC
  Administered 2015-11-25: 10 mg via INTRAVENOUS

## 2015-11-25 MED ORDER — ONDANSETRON HCL 4 MG PO TABS: 4.0000 mg | ORAL_TABLET | Freq: Four times a day (QID) | ORAL | Status: DC | PRN

## 2015-11-25 MED ORDER — ISOSORBIDE MONONITRATE ER 60 MG PO TB24
60.0000 mg | ORAL_TABLET | Freq: Every day | ORAL | Status: DC
  Administered 2015-11-25 – 2015-11-27 (×3): 60 mg via ORAL
  Filled 2015-11-25 (×3): qty 1

## 2015-11-25 MED ORDER — HYDRALAZINE HCL 50 MG PO TABS
100.0000 mg | ORAL_TABLET | Freq: Three times a day (TID) | ORAL | Status: DC
  Administered 2015-11-25 – 2015-11-27 (×4): 100 mg via ORAL
  Filled 2015-11-25 (×4): qty 2

## 2015-11-25 MED ORDER — SENNA 8.6 MG PO TABS
1.0000 | ORAL_TABLET | Freq: Every evening | ORAL | Status: DC | PRN
  Filled 2015-11-25: qty 1

## 2015-11-25 MED ORDER — SODIUM CHLORIDE 0.9 % IV SOLN
Freq: Once | INTRAVENOUS | Status: AC
  Administered 2015-11-25: 1000 mL via INTRAVENOUS

## 2015-11-25 MED ORDER — SODIUM CHLORIDE 0.9 % IV BOLUS (SEPSIS)
1000.0000 mL | Freq: Once | INTRAVENOUS | Status: AC
Start: 2015-11-25 — End: 2015-11-25
  Administered 2015-11-25: 1000 mL via INTRAVENOUS

## 2015-11-25 MED ORDER — DIATRIZOATE MEGLUMINE & SODIUM 66-10 % PO SOLN
15.0000 mL | ORAL | Status: AC
  Administered 2015-11-25: 15 mL via ORAL

## 2015-11-25 MED ORDER — HYDRALAZINE HCL 20 MG/ML IJ SOLN
INTRAMUSCULAR | Status: AC
  Administered 2015-11-25: 10 mg via INTRAVENOUS
  Filled 2015-11-25: qty 1

## 2015-11-25 MED ORDER — ALLOPURINOL 100 MG PO TABS
100.0000 mg | ORAL_TABLET | Freq: Every day | ORAL | Status: DC
  Administered 2015-11-25 – 2015-11-27 (×3): 100 mg via ORAL
  Filled 2015-11-25 (×3): qty 1

## 2015-11-25 NOTE — ED Provider Notes (Signed)
[facility] Emergency Department Provider Note  Time seen: 3:28 PM  I have reviewed the triage vital signs and the nursing notes.   HISTORY  Chief Complaint Urinary Tract Infection    HPI Sally Curry is a 75 y.o. female with a past medical history of CAD, hypertension, CVA, CHF, who presents the emergency department with possible UTI. Per EMS report patient was seen by her PCP yesterday for UTI symptoms, given an antibiotic but never took them. Home health nurse and the patient to the emergency department today for evaluation due to generalized weakness, dysuria and polyuria. Patient does have an open wound to the right hip which is currently being managed by wound clinic. Patient is not able to provide a history. She states she is "very sick", but cannot specify any symptoms. When asked specifically about symptoms such as pain, fever, weakness, etc. she states "I don't know, you have to ask my daughter." Currently awaiting family arrival for further history.     Past Medical History  Diagnosis Date  . CAD (coronary artery disease)   . Hypertension   . Stroke (HCC)   . Gout   . PVD (peripheral vascular disease) (HCC)   . Anemia   . Chronic kidney disease   . Anxiety   . CHF (congestive heart failure) (HCC)   . Depression   . Glaucoma   . Presence of permanent cardiac pacemaker   . Renal insufficiency     Patient Active Problem List   Diagnosis Date Noted  . History of CVA with residual deficit 10/04/2015  . CHF (congestive heart failure) (HCC) 09/22/2015  . UTI (lower urinary tract infection) 06/04/2015  . Duodenal ulcer with hemorrhage 01/20/2014  . CAD (coronary artery disease), native coronary artery 01/20/2014  . Essential hypertension, benign 01/20/2014  . Dyslipidemia 01/20/2014  . Hypokalemia 01/20/2014  . Glaucoma 01/20/2014  . Depression with anxiety 01/20/2014    Past Surgical History  Procedure Laterality Date  . Coronary  angioplasty with stent placement    . Pacemaker insertion    . Cholecystectomy    . Carotid endarterectomy    . Percutaneous placement intravascular stent cervical carotid artery    . Appendectomy    . Abdominal surgery      Current Outpatient Rx  Name  Route  Sig  Dispense  Refill  . allopurinol (ZYLOPRIM) 100 MG tablet   Oral   Take 100 mg by mouth daily.         Marland Kitchen. amLODipine (NORVASC) 2.5 MG tablet   Oral   Take 1 tablet (2.5 mg total) by mouth daily.   30 tablet   3   . aspirin EC 81 MG tablet   Oral   Take 81 mg by mouth daily.         . cholecalciferol (VITAMIN D) 1000 units tablet   Oral   Take 1,000 Units by mouth daily.         . clopidogrel (PLAVIX) 75 MG tablet   Oral   Take 75 mg by mouth daily.         . folic acid (FOLVITE) 1 MG tablet   Oral   Take 800 mcg by mouth daily.          . furosemide (LASIX) 20 MG tablet   Oral   Take 1 tablet (20 mg total) by mouth daily.   30 tablet      . gemfibrozil (LOPID) 600 MG tablet   Oral  Take 600 mg by mouth 2 (two) times daily before a meal.         . hydrALAZINE (APRESOLINE) 100 MG tablet   Oral   Take 100 mg by mouth 3 (three) times daily.         . Iron, Ferrous Sulfate, 142 (45 Fe) MG TBCR   Oral   Take 142 mg by mouth 2 (two) times daily.   60 tablet   0   . isosorbide mononitrate (IMDUR) 60 MG 24 hr tablet   Oral   Take 60 mg by mouth daily.         Marland Kitchen lovastatin (MEVACOR) 40 MG tablet   Oral   Take 40 mg by mouth at bedtime.         . Melatonin 3 MG TABS   Oral   Take 3 mg by mouth at bedtime as needed (for sleep).         . metoprolol succinate (TOPROL-XL) 50 MG 24 hr tablet   Oral   Take 50 mg by mouth daily. Take with or immediately following a meal.         . Multiple Vitamin (MULTIVITAMIN WITH MINERALS) TABS tablet   Oral   Take 1 tablet by mouth daily.         Marland Kitchen nystatin cream (MYCOSTATIN)   Topical   Apply 1 application topically 2 (two) times  daily as needed (for rash/itching).          Marland Kitchen omeprazole (PRILOSEC) 40 MG capsule   Oral   Take 40 mg by mouth daily.         . ondansetron (ZOFRAN) 4 MG tablet   Oral   Take 4 mg by mouth every 8 (eight) hours as needed for nausea or vomiting.         Marland Kitchen PARoxetine (PAXIL) 40 MG tablet   Oral   Take 40 mg by mouth at bedtime.         . pilocarpine (PILOCAR) 1 % ophthalmic solution   Both Eyes   Place 1 drop into both eyes 4 (four) times daily.         . potassium chloride SA (K-DUR,KLOR-CON) 20 MEQ tablet   Oral   Take 1 tablet (20 mEq total) by mouth 2 (two) times daily.         Marland Kitchen senna (SENOKOT) 8.6 MG tablet   Oral   Take 1 tablet by mouth at bedtime as needed for constipation.           Allergies Review of patient's allergies indicates no known allergies.  Family History  Problem Relation Age of Onset  . Hypertension Mother   . Parkinson's disease Mother   . Emphysema Father   . Diabetes Sister     Social History Social History  Substance Use Topics  . Smoking status: Never Smoker   . Smokeless tobacco: Never Used  . Alcohol Use: No    Review of Systems Patient is unable to answer review of systems questioning, history of CVA/dementia.  ____________________________________________   PHYSICAL EXAM:  VITAL SIGNS: ED Triage Vitals  Enc Vitals Group     BP 11/25/15 1509 175/64 mmHg     Pulse Rate 11/25/15 1509 86     Resp 11/25/15 1509 16     Temp 11/25/15 1509 97.9 F (36.6 C)     Temp Source 11/25/15 1509 Oral     SpO2 11/25/15 1509 96 %     Weight --  Height --      Head Cir --      Peak Flow --      Pain Score --      Pain Loc --      Pain Edu? --      Excl. in GC? --    Constitutional: Alert. No distress. Awake and answers questions but is confused, disoriented, likely a baseline however. Awaiting family arrival for confirmation. Eyes: Normal exam ENT   Head: Normocephalic and atraumatic.   Mouth/Throat: Dry  mucous membranes. Cardiovascular: Normal rate, regular rhythm. Respiratory: Normal respiratory effort without tachypnea nor retractions. Breath sounds are clear  Gastrointestinal: Soft and nontender. No reaction to abdominal palpation. No distention. Musculoskeletal: Patient does have a packed wound to the right anterior hip, no surrounding erythema or induration. No signs of acute infection. Neurologic:  Normal speech and language. Unable to participate adequately in neurological exam. Skin:  Skin is warm, dry and intact.  Psychiatric: Somnolent.  ____________________________________________   RADIOLOGY  No acute abnormality  ____________________________________________    INITIAL IMPRESSION / ASSESSMENT AND PLAN / ED COURSE  Pertinent labs & imaging results that were available during my care of the patient were reviewed by me and considered in my medical decision making (see chart for details).  Patient presents the emergency department with report of generalized weakness dysuria and polyuria. Patient cannot contribute to her history. Currently awaiting family arrival for further history. We will check labs, IV hydrate and closely monitor in the emergency department.   The daughter is now here. She states they took the patient to see her surgeon yesterday given her generalized weakness, and she hasn't open wound to the right lower abdomen in the area where she had a panniculectomy in January. The surgeons saw the wound, did not believe the wound was associated with the patient generalized fatigue/weakness and center to the emergency department for evaluation. At the emergency department the patient was seen and evaluated, labs were drawn, blood cultures were sent, urine culture was sent, the patient was diagnosed with urinary tract infection and given a dose of anabiotic prior to discharge. Daughter states soon after discharge the patient vomited, she attempted to give another  antibiotic last night and the patient vomited once again. She states she was awoken overnight around 4:00 in the morning by the patient vomiting while lying in bed. She once again attempted to give the patient an antibiotic this morning and the patient once again vomited. Home health nurse came to see the patient today as the daughter was working, and due to the generalized weakness, somnolence, recent vomiting they sent her to the emergency department via EMS for evaluation. Daughter states at baseline the patient is very sharp mentally, does get confused at times but can walk on her own, can have intelligent conversations. States today the patient has been sleeping all day, is more confused than normal, and appears extremely weak. The daughter does not want a repeat urinalysis performed as the patient had a [facility] which is nitrite positive and a urine culture was sent. She does not want blood cultures repeated as the patient had blood cultures drawn at [facility] yesterday. We will check labs, IV hydrate, start IV Rocephin. Given the report of vomiting and her sleep last light we'll check a chest x-ray to rule out aspiration.  X-ray negative for pneumonia. Patient's labs show a leukocytosis of 13,000, otherwise largely normal. Patient's urinalysis from yesterday shows nitrite positive urine.  We will continue IV antibiotics. As the patient is nauseated cannot tolerate oral antibiotics or her medications we'll admit to the hospital for IV fluids, IV antibiotics  ____________________________________________   FINAL CLINICAL IMPRESSION(S) / ED DIAGNOSES  Generalized weakness Urinary Tract Infection Nausea and vomiting  Minna Antis, MD 11/25/15 1800

## 2015-11-25 NOTE — ED Notes (Signed)
Daughter Dickey GaveJamie Lee, [phone]

## 2015-11-25 NOTE — ED Notes (Signed)
Pt transported to room 203. 

## 2015-11-25 NOTE — H&P (Signed)
[facility] [place] [facility]   PATIENT NAME: Sally Curry    MR#:  [id number]  DATE OF BIRTH:  08/06/1940  DATE OF ADMISSION:  11/25/2015  PRIMARY CARE PHYSICIAN: Leotis ShamesSingh,Jasmine, MD   REQUESTING/REFERRING PHYSICIAN:   CHIEF COMPLAINT:   Chief Complaint  Patient presents with  . Urinary Tract Infection    HISTORY OF PRESENT ILLNESS: Sally DimmerDawn Curry  is a 75 y.o. 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 It appears last 4770 which was drained in March 2017 with residual right lower quadrant abdominal wound,  who presents to the hospital with complaints of generalized weakness, intermittent nausea and vomiting, dysuria, polyuria. Apparently patient was seen by primary care physician for possible urinary tract infection symptoms, given antibiotic, but there was not able to ever take it due to nausea and vomiting. Review of systems is difficult obtained as the patient is weak, somnolent. The patient admits of having fever to 101 over the past few days.   PAST MEDICAL HISTORY:   Past Medical History  Diagnosis Date  . CAD (coronary artery disease)   . Hypertension   . Stroke (HCC)   . Gout   . PVD (peripheral vascular disease) (HCC)   . Anemia   . Chronic kidney disease   . Anxiety   . CHF (congestive heart failure) (HCC)   . Depression   . Glaucoma   . Presence of permanent cardiac pacemaker   . Renal insufficiency     PAST SURGICAL HISTORY: Past Surgical History  Procedure Laterality Date  . Coronary angioplasty with stent placement    . Pacemaker insertion    . Cholecystectomy    . Carotid endarterectomy    . Percutaneous placement intravascular stent cervical carotid artery    . Appendectomy    . Abdominal surgery      SOCIAL HISTORY:  Social History   Substance Use Topics  . Smoking status: Never Smoker   . Smokeless tobacco: Never Used  . Alcohol Use: No    FAMILY HISTORY:  Family History  Problem Relation Age of Onset  . Hypertension Mother   . Parkinson's disease Mother   . Emphysema Father   . Diabetes Sister     DRUG ALLERGIES: No Known Allergies  Review of Systems  Constitutional: Positive for fever, weight loss and malaise/fatigue. Negative for chills.  HENT: Negative for congestion.   Eyes: Negative for blurred vision and double vision.  Respiratory: Positive for cough. Negative for sputum production, shortness of breath and wheezing.   Cardiovascular: Negative for chest pain, palpitations, orthopnea, leg swelling and PND.  Gastrointestinal: Positive for nausea, vomiting, abdominal pain and diarrhea. Negative for constipation, blood in stool and melena.  Genitourinary: Positive for dysuria, urgency and frequency. Negative for hematuria.  Musculoskeletal: Negative for falls.  Skin: Negative for rash.  Neurological: Positive for weakness. Negative for dizziness.  Psychiatric/Behavioral: Negative for depression and memory loss. The patient is not nervous/anxious.     MEDICATIONS AT HOME:  Prior to Admission medications   Medication Sig Start Date End Date Taking? Authorizing Provider  allopurinol (ZYLOPRIM) 100 MG tablet Take 100 mg by mouth daily.   Yes Historical Provider, MD  amLODipine (NORVASC) 2.5 MG tablet Take 1 tablet (2.5 mg total) by mouth daily. 03/19/15  Yes Leotis ShamesJasmine Singh, MD  aspirin EC 81 MG  tablet Take 81 mg by mouth daily.   Yes Historical Provider, MD  cholecalciferol (VITAMIN D) 1000 units tablet Take 1,000 Units by mouth daily.   Yes Historical Provider, MD  clopidogrel (PLAVIX) 75 MG tablet Take 75 mg by mouth daily.   Yes Historical Provider, MD  folic acid (FOLVITE) 1 MG tablet Take 800 mcg by mouth daily.    Yes Historical Provider, MD  furosemide (LASIX) 20 MG tablet Take 1 tablet (20 mg  total) by mouth daily. 06/09/15  Yes Ripudeep Jenna Luo, MD  gemfibrozil (LOPID) 600 MG tablet Take 600 mg by mouth 2 (two) times daily before a meal.   Yes Historical Provider, MD  hydrALAZINE (APRESOLINE) 100 MG tablet Take 100 mg by mouth 3 (three) times daily.   Yes Historical Provider, MD  Iron, Ferrous Sulfate, 142 (45 Fe) MG TBCR Take 142 mg by mouth 2 (two) times daily. 09/24/15  Yes Auburn Bilberry, MD  isosorbide mononitrate (IMDUR) 60 MG 24 hr tablet Take 60 mg by mouth daily.   Yes Historical Provider, MD  lovastatin (MEVACOR) 40 MG tablet Take 40 mg by mouth at bedtime.   Yes Historical Provider, MD  Melatonin 3 MG TABS Take 3 mg by mouth at bedtime as needed (for sleep).   Yes Historical Provider, MD  metoprolol succinate (TOPROL-XL) 50 MG 24 hr tablet Take 50 mg by mouth daily. Take with or immediately following a meal.   Yes Historical Provider, MD  Multiple Vitamin (MULTIVITAMIN WITH MINERALS) TABS tablet Take 1 tablet by mouth daily.   Yes Historical Provider, MD  nystatin cream (MYCOSTATIN) Apply 1 application topically 2 (two) times daily as needed (for rash/itching).    Yes Historical Provider, MD  omeprazole (PRILOSEC) 40 MG capsule Take 40 mg by mouth daily.   Yes Historical Provider, MD  ondansetron (ZOFRAN) 4 MG tablet Take 4 mg by mouth every 8 (eight) hours as needed for nausea or vomiting.   Yes Historical Provider, MD  PARoxetine (PAXIL) 40 MG tablet Take 40 mg by mouth at bedtime.   Yes Historical Provider, MD  pilocarpine (PILOCAR) 1 % ophthalmic solution Place 1 drop into both eyes 4 (four) times daily.   Yes Historical Provider, MD  potassium chloride SA (K-DUR,KLOR-CON) 20 MEQ tablet Take 1 tablet (20 mEq total) by mouth 2 (two) times daily. 06/09/15  Yes Ripudeep Jenna Luo, MD  senna (SENOKOT) 8.6 MG tablet Take 1 tablet by mouth at bedtime as needed for constipation.   Yes Historical Provider, MD      PHYSICAL EXAMINATION:   VITAL SIGNS: Blood pressure 182/71, pulse 96,  temperature 97.9 F (36.6 C), temperature source Oral, resp. rate 20, SpO2 92 %.  GENERAL:  75 y.o.-year-old patient lying in the bed with no acute distressSomnolent, however, able to open her eyes, converses briefly and then drifts back to sleep.  EYES: Pupils equal, round, reactive to light and accommodation. No scleral icterus. Extraocular muscles intact.  HEENT: Head atraumatic, normocephalic. Oropharynx and nasopharynx clear. Dry oral mucosa, lips  NECK:  Supple, no jugular venous distention. No thyroid enlargement, no tenderness.  LUNGS: Normal breath sounds bilaterally, no wheezing, rales,rhonchi or crepitation. No use of accessory muscles of respiration.  CARDIOVASCULAR: S1, S2 normal. No murmurs, rubs, or gallops.  ABDOMEN: Soft,mildly tender to palpation in the right upper quadrant but no rebound or guarding was noted, nondistended. Bowel sounds present. No organomegaly or mass. Right lower quadrant abdominal wall wound with some erythematous, increased warmth surrounding. No  purulence noted, no foul smell  EXTREMITIES: No pedal edema, cyanosis, or clubbing.  NEUROLOGIC: Cranial nerves II through XII are intact. Muscle strength 5/5 in all extremities. Sensation intact. Gait not checked.  PSYCHIATRIC: The patient is somnolent, oriented x 3.  SKIN: No obvious rash, lesion, or ulcer. Present. Lower quadrant abdominal wall wound with some erythematous, increased warmth surroundings, with no purulence or foul smell, no drainage  LABORATORY PANEL:   CBC  Recent Labs Lab 11/25/15 1553  WBC 13.4*  HGB 13.4  HCT 41.8  PLT 311  MCV 92.7  MCH 29.8  MCHC 32.1  RDW 15.6*   ------------------------------------------------------------------------------------------------------------------  Chemistries   Recent Labs Lab 11/25/15 1553  NA 136  K 4.2  CL 103  CO2 17*  GLUCOSE 140*  BUN 68*  CREATININE 2.00*  CALCIUM 10.3  AST 23  ALT 14  ALKPHOS 103  BILITOT 0.8    ------------------------------------------------------------------------------------------------------------------  Cardiac Enzymes  Recent Labs Lab 11/25/15 1553  TROPONINI <0.03   ------------------------------------------------------------------------------------------------------------------  RADIOLOGY: Dg Chest Portable 1 View  11/25/2015  CLINICAL DATA:  Weakness and dysuria.  Cough. EXAM: PORTABLE CHEST 1 VIEW COMPARISON:  September 22, 2015 FINDINGS: There is no edema or consolidation. Heart is slightly enlarged with pulmonary vascularity within normal limits. Pacemaker leads are attached to the right atrium and right ventricle. There is a coronary stent on the left. There is calcification in the aortic arch region. No adenopathy is evident. There is an old healed fracture of the proximal right humerus. IMPRESSION: No edema or consolidation. Mild cardiac enlargement. Aortic atherosclerosis. Pacemaker leads attached to right atrium and right ventricle. Coronary stent noted on the left. Electronically Signed   By: Bretta BangWilliam  Woodruff III M.D.   On: 11/25/2015 17:03    EKG: Orders placed or performed during the hospital encounter of 09/22/15  . EKG 12-Lead  . EKG 12-Lead  . EKG    IMPRESSION AND PLAN:  Principal Problem:   Sepsis (HCC) Active Problems:   Malignant essential hypertension   Generalized weakness   Acute on chronic renal failure (HCC)   Leukocytosis   Nausea and vomiting   Dehydration #1. Sepsis due to urinary tract infection, admit patient to medical floor, initiate her on Rocephin,  [facility], pending.  #2. Acute on chronic renal failure, initiate patient on IV fluids, follow patient's creatinine in the morning, gets CT of abdomen and pelvis without contrast  #3. Leukocytosis, follow with antibiotic therapy   #4. nausea and vomiting, supportive therapy, IV fluids, follow clinically, initiate clear  liquid diet if patient is able to take by mouth #5. Dehydration, follow clinically #6. Malignant essential hypertension, continue Norvasc, hydralazine, Imdur, metoprolol, advanced medications as needed   All the records are reviewed and case discussed with ED provider. Management plans discussed with the patient, family and they are in agreement.  CODE STATUS: Code Status History    Date Active Date Inactive Code Status Order ID Comments User Context   09/22/2015  8:29 PM 09/25/2015  2:52 PM Full Code [id number]  Alford Highlandichard Wieting, MD ED   06/04/2015  4:30 AM 06/07/2015  7:22 PM Full Code [id number]  Alberteen Samhristopher P Danford, MD Inpatient   03/16/2015  7:23 PM 03/19/2015  7:52 PM Full Code [id number]  Gale Journeyatherine P Walsh, MD Inpatient       TOTAL TIME TAKING CARE OF THIS PATIENT: 50 minutes.    Katharina CaperVAICKUTE,Myangel Summons M.D on 11/25/2015 at 6:37 PM  Between 7am  to 6pm - Pager - [phone] After 6pm go to [email or web address] - password [email or web address] [facility]  [place] [facility]  Office  [phone]  CC: Primary care physician; Leotis Shames, MD

## 2015-11-25 NOTE — ED Notes (Signed)
Pt placed on bedpan to urinate.

## 2015-11-25 NOTE — ED Notes (Signed)
MD informed of pt last BP reading. Family concerned because pt has not been able to take her BP meds today due to lack of coordination when swallowing. Verbal orders received.

## 2015-11-25 NOTE — ED Notes (Signed)
Pt from home via EMS, reports UTI symptoms, was seen at PCP yesterday for same and given ABX but never took them. Reports generalized weakness, polyuria and dysuria. Pt also has open wound on R hip

## 2015-11-25 NOTE — ED Notes (Signed)
Pt assisted to use bedpan, pericare performed and pt resting comfortably in room. Call bell within reach, will continue to monitor.

## 2015-11-26 LAB — BASIC METABOLIC PANEL
ANION GAP: 10 (ref 5–15)
BUN: 51 mg/dL — ABNORMAL HIGH (ref 6–20)
CALCIUM: 9.3 mg/dL (ref 8.9–10.3)
CO2: 14 mmol/L — ABNORMAL LOW (ref 22–32)
CREATININE: 1.37 mg/dL — AB (ref 0.44–1.00)
Chloride: 112 mmol/L — ABNORMAL HIGH (ref 101–111)
GFR, EST AFRICAN AMERICAN: 43 mL/min — AB (ref >60)
GFR, EST NON AFRICAN AMERICAN: 37 mL/min — AB (ref >60)
GLUCOSE: 159 mg/dL — AB (ref 65–99)
Potassium: 3.9 mmol/L (ref 3.5–5.1)
Sodium: 136 mmol/L (ref 135–145)

## 2015-11-26 LAB — CBC
HCT: 35.8 % (ref 35.0–47.0)
HEMOGLOBIN: 11.7 g/dL — AB (ref 12.0–16.0)
MCH: 30.5 pg (ref 26.0–34.0)
MCHC: 32.7 g/dL (ref 32.0–36.0)
MCV: 93.2 fL (ref 80.0–100.0)
PLATELETS: 272 10*3/uL (ref 150–440)
RBC: 3.84 MIL/uL (ref 3.80–5.20)
RDW: 16.1 % — ABNORMAL HIGH (ref 11.5–14.5)
WBC: 11.4 10*3/uL — ABNORMAL HIGH (ref 3.6–11.0)

## 2015-11-26 LAB — TROPONIN I
TROPONIN I: 0.03 ng/mL — AB (ref <0.03)
TROPONIN I: 0.03 ng/mL — AB (ref <0.03)

## 2015-11-26 MED ORDER — CEPHALEXIN 500 MG PO CAPS
500.0000 mg | ORAL_CAPSULE | Freq: Two times a day (BID) | ORAL | Status: DC
  Administered 2015-11-26 – 2015-11-27 (×3): 500 mg via ORAL
  Filled 2015-11-26 (×3): qty 1

## 2015-11-26 MED ORDER — PAROXETINE HCL 20 MG PO TABS
40.0000 mg | ORAL_TABLET | Freq: Every day | ORAL | Status: DC
  Administered 2015-11-26: 40 mg via ORAL
  Filled 2015-11-26: qty 2

## 2015-11-26 NOTE — H&P (Signed)
Md notified of SVT's . No new order. Continue to monitor.

## 2015-11-26 NOTE — Evaluation (Signed)
Occupational Therapy Evaluation Patient Details Name: Sally Curry MRN: [id number] DOB: Mar 30, 1941 Today's Date: 11/26/2015    History of Present Illness Pt. is a 75 y.o. female who presents to the hospital with complaints of generalized weakness, intermittent nausea and vomiting, dysuria, and polyuria. Pt. has ben recently treated for a UTI.   Clinical Impression   Pt. Is a 75 y.o. Female who presents to the hospital with generalized weakness, intermittent nausea, vomiting, dysuria, and polyuria. OT initial assessment was performed bedside, as pt. declined OOB activity secondary to not feeling well. Pt. 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 Pt. Could benefit from skilled OT services for ADL and A/E training, neuro-muscular re-ed, pt. Ed. In energy conservation/work simplification skills.     Follow Up Recommendations  Outpatient OT    Equipment Recommendations       Recommendations for Other Services       Precautions / Restrictions   Pacemaker                                                    ADL Overall ADL's : Needs assistance/impaired                                       General ADL Comments: Pt. reports using her LUE to assist with self care tasks secondary to residual limitations in her RUE from a remote CVA. Pt. reports being able to complete light ADL tasks for grooming and self-feeding. Pt. reports having a poor appetite.     Vision     Perception     Praxis      Pertinent Vitals/Pain       Hand Dominance Left   Extremity/Trunk Assessment Upper Extremity Assessment Upper Extremity Assessment: RUE deficits/detail;LUE deficits/detail RUE Deficits / Details: RUE  shoulder limitations in flexion and abduction from a previous CVA. 3/5 elbow flexion, 3=/5 wrist extension.    LUE Deficits / Details: LUE is WFL. Pt. Has a history of  pacemaker placed in 2000 per pt. report.           Communication Communication Communication: No difficulties   Cognition Arousal/Alertness: Awake/alert Behavior During Therapy: WFL for tasks assessed/performed Overall Cognitive Status: Within Functional Limits for tasks assessed                     General Comments       Exercises       Shoulder Instructions      Home Living Family/patient expects to be discharged to:: Private residence Living Arrangements: Children Available Help at Discharge: Family;Available PRN/intermittently Type of Home: House Home Access: Level entry     Home Layout: Two level;1/2 bath on main level Alternate Level Stairs-Number of Steps: 15 Alternate Level Stairs-Rails: Right Bathroom Shower/Tub: Tub/shower unit Shower/tub characteristics: Curtain FirefighterBathroom Toilet: Standard Bathroom Accessibility: Yes   Home Equipment: Environmental consultantWalker - 2 wheels;Walker - 4 wheels;Tub bench;Grab bars - tub/shower;Wheelchair - manual          Prior Functioning/Environment Level of Independence: Independent with assistive device(s)             OT Diagnosis: Generalized weakness   OT Problem List: Decreased strength;Decreased activity tolerance;Decreased knowledge of use  of DME or AE;Impaired UE functional use;Decreased coordination   OT Treatment/Interventions: Self-care/ADL training;Therapeutic activities;Therapeutic exercise;Neuromuscular education;Patient/family education    OT Goals(Current goals can be found in the care plan section) Acute Rehab OT Goals Patient Stated Goal: To retrun home, and get better. OT Goal Formulation: With patient  OT Frequency: Min 1X/week   Barriers to D/C:            Co-evaluation              End of Session    Activity Tolerance: Other (comment) (Session limited as Pt. reports just not feeling well.Marland Kitchen.) Patient left: in bed;with call bell/phone within reach;with bed alarm set   Time: [id number] OT  Time Calculation (min): 13 min Charges:  OT General Charges $OT Visit: 1 Procedure OT Evaluation $OT Eval Low Complexity: 1 Procedure G-Codes:    Sally MessierElaine Emmagrace Runkel, MS, OTR/L Sally Curry 11/26/2015, 10:04 AM

## 2015-11-26 NOTE — Progress Notes (Signed)
MD notified of 0.03 Troponin. No new order.

## 2015-11-26 NOTE — Care Management Note (Signed)
Case Management Note  Patient Details  Name: Sally Curry MRN: [id number] Date of Birth: 01-25-1941  Subjective/Objective:     Spoke with patient who is alert and oriented from home with her adult daughter [name]. Patient stated that her daughter takes care of her but that she does work during the day. She stated that her daughter "gets everything ready for her in the morning " before leaving for work in the morning.   Patient has a Rolator, a shower chair and a bedside commode, but no front rolling walker. Patient does live in a home that is on two levels and has to transverse stairs to get to her bathroom. She stated her daughter assists her but that she is able to do this without difficulty.  No stairs to get into her home.  Patient [facility] and denies issues with filling prescriptions. She doe not drive but her daughter takes her to appointments. PT has recommended Home Health PT. [facility] She has had HH previously but cannot remember the company , she thinks it may have been [facility].  Contact information given to patient for myself and 2C case manager Bevelyn NgoStephanie Bowen. More to follow.        Action/Plan: Home with HH.   Expected Discharge Date:                  Expected Discharge Plan:  Home w Home Health Services  In-House Referral:     Discharge planning Services  CM Consult  Post Acute Care Choice:    Choice offered to:  Patient  DME Arranged:    DME Agency:     HH Arranged:    HH Agency:     Status of Service:  In process, will continue to follow  If discussed at Long Length of Stay Meetings, dates discussed:    Additional Comments:  Adonis HugueninBerkhead, Ulrick Methot L, RN 11/26/2015, 2:56 PM

## 2015-11-26 NOTE — Progress Notes (Addendum)
Patient ID: Sally Curry, female   DOB: 02/22/1941, 75 y.o.   MRN: [id number] [facility]  [facility]   PATIENT NAME: Sally Curry    MR#:  [id number]  DATE OF BIRTH:  02/22/1941  SUBJECTIVE:  Came in after having increasing nausea vomiting and unable to tolerate oral antibiotics given as outpatient for UTI. Patient feels much better today able to tolerate by mouth diet no vomiting. Daughter in the room.  REVIEW OF SYSTEMS:   Review of Systems  Constitutional: Negative for fever, chills and weight loss.  HENT: Negative for ear discharge, ear pain and nosebleeds.   Eyes: Negative for blurred vision, pain and discharge.  Respiratory: Negative for sputum production, shortness of breath, wheezing and stridor.   Cardiovascular: Negative for chest pain, palpitations, orthopnea and PND.  Gastrointestinal: Positive for nausea. Negative for vomiting, abdominal pain and diarrhea.  Genitourinary: Negative for urgency and frequency.  Musculoskeletal: Negative for back pain and joint pain.  Neurological: Positive for weakness. Negative for sensory change, speech change and focal weakness.  Psychiatric/Behavioral: Negative for depression and hallucinations. The patient is not nervous/anxious.   All other systems reviewed and are negative.  Tolerating Diet: YES Tolerating PT: Pending  DRUG ALLERGIES:  No Known Allergies  VITALS:  Blood pressure 144/56, pulse 90, temperature 98.4 F (36.9 C), temperature source Oral, resp. rate 18, height 5' (1.524 m), weight 68.675 kg (151 lb 6.4 oz), SpO2 95 %.  PHYSICAL EXAMINATION:   Physical Exam  GENERAL:  75 y.o.-year-old patient lying in the bed with no acute distress. Morbidly obese. EYES: Pupils equal, round, reactive to light and accommodation. No scleral icterus. Extraocular muscles intact.  HEENT: Head atraumatic, normocephalic. Oropharynx and nasopharynx clear.  NECK:  Supple, no jugular venous  distention. No thyroid enlargement, no tenderness.  LUNGS: Normal breath sounds bilaterally, no wheezing, rales, rhonchi. No use of accessory muscles of respiration.  CARDIOVASCULAR: S1, S2 normal. No murmurs, rubs, or gallops.  ABDOMEN: Soft, nontender, nondistended. Bowel sounds present. No organomegaly or mass. Chronic   right lower quadrant abdominal wound from previous surgery healing well. No skin cellulitis on the abdomen.  exREMITIES: No cyanosis, clubbing or edema b/l.    NEUROLOGIC: Cranial nerves II through XII are intact. No focal Motor or sensory deficits b/l.   PSYCHIATRIC:  patient is alert and oriented x 3.  SKIN: No obvious rash, lesion, or ulcer.   LABORATORY PANEL:  CBC  Recent Labs Lab 11/26/15 0837  WBC 11.4*  HGB 11.7*  HCT 35.8  PLT 272    Chemistries   Recent Labs Lab 11/25/15 1553 11/26/15 0837  NA 136 136  K 4.2 3.9  CL 103 112*  CO2 17* 14*  GLUCOSE 140* 159*  BUN 68* 51*  CREATININE 2.00* 1.37*  CALCIUM 10.3 9.3  AST 23  --   ALT 14  --   ALKPHOS 103  --   BILITOT 0.8  --    Cardiac Enzymes  Recent Labs Lab 11/26/15 0837  TROPONINI 0.03*   RADIOLOGY:  Ct Abdomen Pelvis Wo Contrast  11/25/2015  CLINICAL DATA:  Lower abdominal wound. Generalized weakness and nausea. EXAM: CT ABDOMEN AND PELVIS WITHOUT CONTRAST TECHNIQUE: Multidetector CT imaging of the abdomen and pelvis was performed following the standard protocol without IV contrast. COMPARISON:  08/16/2015 FINDINGS: The anterior abdominal wall subcutaneous inflammation has partially resolved since 08/16/2015. On the midline sagittal image, the involved area now measures 3.5 x 8.0 cm and previously measured  4.6 x 12.8 cm. No large drainable collection is evident, but a small internal component of drainable material is not entirely excluded. Sensitivity is reduced in the absence of intravenous contrast. No evidence of extension of inflammation or abscess deep to the abdominal wall  musculature. There are unremarkable unenhanced appearances of the liver, gallbladder, bile ducts, pancreas, spleen, adrenals and kidneys. Incidental unchanged 4.2 cm cyst of the left renal lower pole. Ureters and urinary bladder are unremarkable. The stomach, small bowel and colon are remarkable only for mild uncomplicated colonic diverticulosis. There is very extensive atherosclerotic calcification of the normal caliber aorta. Prior hysterectomy.  No adnexal abnormality. No ascites.  No adenopathy. No significant abnormality in the lower chest. No significant skeletal lesion. IMPRESSION: 1. Extensive subcutaneous opacity and stranding associated with the low transverse abdominal wound, partially resolved from 08/16/2015. No large drainable component is evident but cannot exclude a small internal drainable component. No soft tissue gas. No extension deep to the abdominal wall musculature. 2. No acute findings in the peritoneum or retroperitoneum. Electronically Signed   By: Ellery Plunkaniel R Mitchell M.D.   On: 11/25/2015 21:22   Dg Chest Portable 1 View  11/25/2015  CLINICAL DATA:  Weakness and dysuria.  Cough. EXAM: PORTABLE CHEST 1 VIEW COMPARISON:  September 22, 2015 FINDINGS: There is no edema or consolidation. Heart is slightly enlarged with pulmonary vascularity within normal limits. Pacemaker leads are attached to the right atrium and right ventricle. There is a coronary stent on the left. There is calcification in the aortic arch region. No adenopathy is evident. There is an old healed fracture of the proximal right humerus. IMPRESSION: No edema or consolidation. Mild cardiac enlargement. Aortic atherosclerosis. Pacemaker leads attached to right atrium and right ventricle. Coronary stent noted on the left. Electronically Signed   By: Bretta BangWilliam  Woodruff III M.D.   On: 11/25/2015 17:03   ASSESSMENT AND PLAN:  Sally Curry is a 75 y.o. female with a known history of Chronic diastolic CHF, hypertension,,  hyperlipidemia, coronary artery disease, CKD-IVCame to the emergency room withgeneralized weakness, intermittent nausea and vomiting, dysuria, polyuria.  #1. Sepsis due to urinary tract infection, admit patient to medical floor, - on Rocephin, [facility], pending.   #2. Acute on chronic renal failure, initiate patient on IV fluids, -Creatinine improving. Patient looks well hydrated. Encourage oral fluid intake.   #3. Leukocytosis, follow with antibiotic therapy   #4. nausea and vomiting, supportive therapy, IV fluids -Patient tolerating diet appropriately will advance as tolerated.   #5. Dehydration, follow clinically  #6. Malignant essential hypertension, continue Norvasc, hydralazine, Imdur, metoprolol, advanced medications as needed We'll have physical therapy see patient. Case discussed with Care Management/Social Worker. Management plans discussed with the patient, family and they are in agreement.  CODE STATUS:full  DVT Prophylaxis: lovenox  TOTAL TIME TAKING CARE OF THIS PATIENT: 30 minutes.  >50% time spent on counselling and coordination of care  POSSIBLE D/C IN1-2* DAYS, DEPENDING ON CLINICAL CONDITION.  Note: This dictation was prepared with Dragon dictation along with smaller phrase technology. Any transcriptional errors that result from this process are unintentional.  Iyonnah Ferrante M.D on 11/26/2015 at 2:00 PM  Between 7am to 6pm - Pager - [phone]  After 6pm go to [email or web address] - password [email or web address] [facility]  [facility]  Office  [phone]  CC: Primary care physician; Leotis ShamesSingh,Jasmine, MD

## 2015-11-26 NOTE — Progress Notes (Signed)
Initial Nutrition Assessment  DOCUMENTATION CODES:   Not applicable  INTERVENTION:  -Spoke with MD Allena KatzPatel regarding diet advancement, MD agreeable to advancing to Soft Diet; orders placed. Cater to pt preferences -If po intake inadequate on follow-up, recommend addition of nutritional supplement  NUTRITION DIAGNOSIS:   Inadequate oral intake related to acute illness as evidenced by per patient/family report.  GOAL:   Patient will meet greater than or equal to 90% of their needs  MONITOR:   PO intake, Labs, Weight trends  REASON FOR ASSESSMENT:   Malnutrition Screening Tool    ASSESSMENT:    75 yo female admitted with sepsis due to UTI, acute on CRF  Pt reports fair appetite at home, eating 2-3 meals per day. Pt thinks she has lost weight but does not know how much or her UBW. Pt reports she lives at home with her daughter and daughter does grocery shopping and cooking  Pt reports N/V for 1 night prior to admission; tolerated CL trays today without difficulty  Past Medical History  Diagnosis Date  . CAD (coronary artery disease)   . Hypertension   . Stroke (HCC)   . Gout   . PVD (peripheral vascular disease) (HCC)   . Anemia   . Chronic kidney disease   . Anxiety   . CHF (congestive heart failure) (HCC)   . Depression   . Glaucoma   . Presence of permanent cardiac pacemaker   . Renal insufficiency    Diet Order:  Clear Liquid Diet  Skin:  Reviewed, no issues  Last BM:  11/26/15  Height:   Ht Readings from Last 1 Encounters:  11/25/15 5' (1.524 m)    Weight:  Wt Readings from Last 1 Encounters:  11/25/15 151 lb 6.4 oz (68.675 kg)   BMI:  Body mass index is 29.57 kg/(m^2).  Estimated Nutritional Needs:   Kcal:  1700-2070 kcals   Protein:  69-83 g  Fluid:  >/= 1.7 L  EDUCATION NEEDS:   No education needs identified at this time  Romelle StarcherCate Sianne Tejada MS, RD, LDN [phone] Pager  [phone] Weekend/On-Call Pager

## 2015-11-26 NOTE — Evaluation (Signed)
Physical Therapy Evaluation Patient Details Name: Sally Curry MRN: [id number] DOB: 04-13-1941 Today's Date: 11/26/2015   History of Present Illness  75 yo F presented to ED for symptoms of UTI and has an open wound on her R hip from a prior procedure. She was admitted for sepsis. PMH includes CAD, stroke, PVD, CHF, pacemaker, and renal insufficiency.  Clinical Impression  Pt demonstrated generalized weakness and difficulty walking during evaluation. LE strength grossly 4/5 B. Seated and standing balance good. Required min A for bed mobility as she had difficulty getting trunk upright due to decreased use of R UE from prior stroke. Transfers and ambulation up to 80 ft with FWW required min guard. Demonstrated good stability and fair speed with no LOB. HHPT recommended after hospital discharge to address deficits of strength, endurance, balance and gait to progress towards PLOF. Pt will benefit from skilled PT services to increase functional I and mobility for safe discharge.     Follow Up Recommendations Home health PT;Supervision - Intermittent    Equipment Recommendations  None recommended by PT    Recommendations for Other Services       Precautions / Restrictions Precautions Precautions: Fall;ICD/Pacemaker Restrictions Weight Bearing Restrictions: No      Mobility  Bed Mobility Overal bed mobility: Needs Assistance Bed Mobility: Supine to Sit     Supine to sit: Min assist;HOB elevated     General bed mobility comments: difficulty getting out on L side of bed due to decreased use of R UE  Transfers Overall transfer level: Needs assistance Equipment used: Rolling walker (2 wheeled) Transfers: Sit to/from UGI CorporationStand;Stand Pivot Transfers Sit to Stand: Min guard Stand pivot transfers: Min guard       General transfer comment: good stability, no LOB  Ambulation/Gait Ambulation/Gait assistance: Min guard Ambulation Distance (Feet): 80 Feet Assistive device: Rolling  walker (2 wheeled) Gait Pattern/deviations: Decreased stride length;Narrow base of support Gait velocity: reduced Gait velocity interpretation: Below normal speed for age/gender General Gait Details: Steady with fair speed. Limps due to decreased R knee ext from prior stroke  Stairs            Wheelchair Mobility    Modified Rankin (Stroke Patients Only)       Balance Overall balance assessment: Needs assistance Sitting-balance support: Single extremity supported;Feet supported Sitting balance-Leahy Scale: Good     Standing balance support: Bilateral upper extremity supported Standing balance-Leahy Scale: Good Standing balance comment: maintains without LOB                             Pertinent Vitals/Pain      Home Living Family/patient expects to be discharged to:: Private residence Living Arrangements: Children Available Help at Discharge: Family;Available PRN/intermittently Type of Home: House Home Access: Level entry     Home Layout: Two level;1/2 bath on main level Home Equipment: Walker - 2 wheels;Walker - 4 wheels;Tub bench;Grab bars - tub/shower;Wheelchair - manual      Prior Function Level of Independence: Independent with assistive device(s)         Comments: uses rollator all the time, assistance from daughter for transportation and ambulation outdoors as well as ADLs as needed     Hand Dominance        Extremity/Trunk Assessment   Upper Extremity Assessment: Defer to OT evaluation;RUE deficits/detail RUE Deficits / Details: RUE  shoulder limitations in flexion and abduction from a previous CVA. 3/5 elbow flexion, 3=/5 wrist extension.  LUE Deficits / Details: LUE ROM and strength WFL   Lower Extremity Assessment: Generalized weakness;RLE deficits/detail RLE Deficits / Details: decreased R knee extension ROM from previous stroke       Communication   Communication: No difficulties  Cognition Arousal/Alertness:  Awake/alert Behavior During Therapy: WFL for tasks assessed/performed Overall Cognitive Status: Within Functional Limits for tasks assessed                      General Comments      Exercises Other Exercises Other Exercises: B LE therex: supine: ankle pumps, heel slides, hip abd slides; seated LAQs and marching x15 each. Cues for technique. Some were difficult for R LE due to HS tightness.      Assessment/Plan    PT Assessment Patient needs continued PT services  PT Diagnosis Difficulty walking;Generalized weakness   PT Problem List Decreased strength;Decreased range of motion;Decreased activity tolerance;Decreased balance  PT Treatment Interventions Gait training;Stair training;Therapeutic activities;Therapeutic exercise;Balance training;Neuromuscular re-education;Patient/family education   PT Goals (Current goals can be found in the Care Plan section) Acute Rehab PT Goals Patient Stated Goal: To retrun home, and get better. PT Goal Formulation: With patient Time For Goal Achievement: 12/10/15 Potential to Achieve Goals: Fair    Frequency Min 2X/week   Barriers to discharge Decreased caregiver support pt sometimes alone during the day, daughter is out of work as Runner, broadcasting/film/videoteacher for the summer and will be able to help pt as needed    Co-evaluation               End of Session Equipment Utilized During Treatment: Gait belt Activity Tolerance: Patient tolerated treatment well Patient left: in chair;with call bell/phone within reach;with chair alarm set Nurse Communication: Mobility status         Time: 1325-1351 PT Time Calculation (min) (ACUTE ONLY): 26 min   Charges:   PT Evaluation $PT Eval Low Complexity: 1 Procedure PT Treatments $Therapeutic Exercise: 8-22 mins   PT G Codes:        Adelene IdlerMindy Jo Neomia Herbel, PT, DPT  11/26/2015, 2:26 PM [phone]

## 2015-11-26 NOTE — Progress Notes (Signed)
Lab notified RN of pt.'s critical lab, troponin 0.03. Doctor Sheryle Hailiamond was notified, no new orders given at this time.   Sally Curry

## 2015-11-27 MED ORDER — CEPHALEXIN 500 MG PO CAPS: 500.0000 mg | ORAL_CAPSULE | Freq: Two times a day (BID) | ORAL | Status: DC

## 2015-11-27 NOTE — Progress Notes (Signed)
Dr. Allena KatzPatel notified of BP 141/53 per hypotensive protocol parameters. Give oral antihypertensive as scheduled.

## 2015-11-27 NOTE — Discharge Instructions (Signed)
Home health PT?

## 2015-11-27 NOTE — Care Management Note (Signed)
Case Management Note  Patient Details  Name: Sally Curry MRN: [id number] Date of Birth: 02-09-1941  Subjective/Objective:        This Clinical research associatewriter discussed discharge planning with [name] who chose [facility] to be her home health PT provider because she has used them in the past. A referral was faxed to [facility] requesting HH-PT services.             Action/Plan:   Expected Discharge Date:                  Expected Discharge Plan:  Home w Home Health Services  In-House Referral:     Discharge planning Services  CM Consult  Post Acute Care Choice:    Choice offered to:  Patient  DME Arranged:    DME Agency:     HH Arranged:    HH Agency:     Status of Service:  In process, will continue to follow  If discussed at Long Length of Stay Meetings, dates discussed:    Additional Comments:  Raeven Pint A, RN 11/27/2015, 11:06 AM

## 2015-11-27 NOTE — Progress Notes (Signed)
11/27/2015 5:19 PM  BP 128/41 mmHg  Pulse 77  Temp(Src) 98.1 F (36.7 C) (Oral)  Resp 18  Ht 5' (1.524 m)  Wt 68.675 kg (151 lb 6.4 oz)  BMI 29.57 kg/m2  SpO2 95% Patient discharged per MD orders. Discharge instructions reviewed with patient and patient verbalized understanding. IV removed per policy. Prescriptions discussed and given to patient. Daughter called and informed of all discharge information. Discharged via wheelchair escorted by nursing staff.  Ron ParkerHerron, Kostantinos Tallman D, RN

## 2015-11-27 NOTE — Care Management Note (Signed)
Case Management Note  Patient Details  Name: Nash DimmerDawn Porco MRN: [id number] Date of Birth: 1941/05/09  Subjective/Objective:       A referral for home health PT was faxed to [facility] per [facility]-PT recommendation.              Action/Plan:   Expected Discharge Date:                  Expected Discharge Plan:  Home w Home Health Services  In-House Referral:     Discharge planning Services  CM Consult  Post Acute Care Choice:    Choice offered to:  Patient  DME Arranged:    DME Agency:     HH Arranged:    HH Agency:     Status of Service:  In process, will continue to follow  If discussed at Long Length of Stay Meetings, dates discussed:    Additional Comments:  Brookelyn Gaynor A, RN 11/27/2015, 11:23 AM

## 2015-11-27 NOTE — Discharge Summary (Signed)
[facility] -  at [facility]   PATIENT NAME: Sally Curry    MR#:  [id number]  DATE OF BIRTH:  [birth year]  DATE OF ADMISSION:  11/25/2015 ADMITTING PHYSICIAN: Katharina Caperima Vaickute, MD  DATE OF DISCHARGE: 11/27/15  PRIMARY CARE PHYSICIAN: Singh,Jasmine, MD    ADMISSION DIAGNOSIS:  Weakness [R53.1] UTI (lower urinary tract infection) [N39.0] Nausea vomiting and diarrhea [R11.2, R19.7]  DISCHARGE DIAGNOSIS:  Sepsis due to UTI Acute on chronic renal failure  SECONDARY DIAGNOSIS:   Past Medical History  Diagnosis Date  . CAD (coronary artery disease)   . Hypertension   . Stroke (HCC)   . Gout   . PVD (peripheral vascular disease) (HCC)   . Anemia   . Chronic kidney disease   . Anxiety   . CHF (congestive heart failure) (HCC)   . Depression   . Glaucoma   . Presence of permanent cardiac pacemaker   . Renal insufficiency     HOSPITAL COURSE:  Sally DimmerDawn Curry is a 75 y.o. female with a known history of Chronic diastolic CHF, hypertension,, hyperlipidemia, coronary artery disease, CKD-IVCame to the emergency room withgeneralized weakness, intermittent nausea and vomiting, dysuria, polyuria.  #1. Sepsis due to urinary tract infection, admit patient to medical floor, - on Rocephin, urinary cultures grwoing e coli. Changed to po keflex  #2. Acute on chronic renal failure -recieved IV fluids -Creatinine improving. Patient looks well hydrated. Encourage oral fluid intake.   #3. Leukocytosis, follow with antibiotic therapy   #4. nausea and vomiting, supportive therapy, IV fluids -Patient tolerating diet appropriately will advance as tolerated.   #5. Dehydration resolved  #6. Malignant essential hypertension, continue Norvasc, hydralazine, Imdur, metoprolol, advanced medications as needed  Overall stable for d/c to home with HHPT  CONSULTS OBTAINED:     DRUG ALLERGIES:  No Known Allergies  DISCHARGE MEDICATIONS:   Current Discharge  Medication List    START taking these medications   Details  cephALEXin (KEFLEX) 500 MG capsule Take 1 capsule (500 mg total) by mouth 2 (two) times daily. Qty: 12 capsule, Refills: 0      CONTINUE these medications which have NOT CHANGED   Details  allopurinol (ZYLOPRIM) 100 MG tablet Take 100 mg by mouth daily.    amLODipine (NORVASC) 2.5 MG tablet Take 1 tablet (2.5 mg total) by mouth daily. Qty: 30 tablet, Refills: 3    aspirin EC 81 MG tablet Take 81 mg by mouth daily.    cholecalciferol (VITAMIN D) 1000 units tablet Take 1,000 Units by mouth daily.    clopidogrel (PLAVIX) 75 MG tablet Take 75 mg by mouth daily.    folic acid (FOLVITE) 1 MG tablet Take 800 mcg by mouth daily.     furosemide (LASIX) 20 MG tablet Take 1 tablet (20 mg total) by mouth daily. Qty: 30 tablet    gemfibrozil (LOPID) 600 MG tablet Take 600 mg by mouth 2 (two) times daily before a meal.    hydrALAZINE (APRESOLINE) 100 MG tablet Take 100 mg by mouth 3 (three) times daily.    Iron, Ferrous Sulfate, 142 (45 Fe) MG TBCR Take 142 mg by mouth 2 (two) times daily. Qty: 60 tablet, Refills: 0    isosorbide mononitrate (IMDUR) 60 MG 24 hr tablet Take 60 mg by mouth daily.    lovastatin (MEVACOR) 40 MG tablet Take 40 mg by mouth at bedtime.    Melatonin 3 MG TABS Take 3 mg by mouth at bedtime as needed (for sleep).  metoprolol succinate (TOPROL-XL) 50 MG 24 hr tablet Take 50 mg by mouth daily. Take with or immediately following a meal.    Multiple Vitamin (MULTIVITAMIN WITH MINERALS) TABS tablet Take 1 tablet by mouth daily.    nystatin cream (MYCOSTATIN) Apply 1 application topically 2 (two) times daily as needed (for rash/itching).     omeprazole (PRILOSEC) 40 MG capsule Take 40 mg by mouth daily.    ondansetron (ZOFRAN) 4 MG tablet Take 4 mg by mouth every 8 (eight) hours as needed for nausea or vomiting.    PARoxetine (PAXIL) 40 MG tablet Take 40 mg by mouth at bedtime.    pilocarpine  (PILOCAR) 1 % ophthalmic solution Place 1 drop into both eyes 4 (four) times daily.    potassium chloride SA (K-DUR,KLOR-CON) 20 MEQ tablet Take 1 tablet (20 mEq total) by mouth 2 (two) times daily.    senna (SENOKOT) 8.6 MG tablet Take 1 tablet by mouth at bedtime as needed for constipation.        If you experience worsening of your admission symptoms, develop shortness of breath, life threatening emergency, suicidal or homicidal thoughts you must seek medical attention immediately by calling 911 or calling your MD immediately  if symptoms less severe.  You Must read complete instructions/literature along with all the possible adverse reactions/side effects for all the Medicines you take and that have been prescribed to you. Take any new Medicines after you have completely understood and accept all the possible adverse reactions/side effects.   Please note  You were cared for by a hospitalist during your hospital stay. If you have any questions about your discharge medications or the care you received while you were in the hospital after you are discharged, you can call the unit and asked to speak with the hospitalist on call if the hospitalist that took care of you is not available. Once you are discharged, your primary care physician will handle any further medical issues. Please note that NO REFILLS for any discharge medications will be authorized once you are discharged, as it is imperative that you return to your primary care physician (or establish a relationship with a primary care physician if you do not have one) for your aftercare needs so that they can reassess your need for medications and monitor your lab values. Today   SUBJECTIVE   Doing well  VITAL SIGNS:  Blood pressure 141/53, pulse 72, temperature 98.2 F (36.8 C), temperature source Oral, resp. rate 18, height 5' (1.524 m), weight 68.675 kg (151 lb 6.4 oz), SpO2 96 %.  I/O:   Intake/Output Summary (Last 24 hours) at  11/27/15 1112 Last data filed at 11/27/15 0800  Gross per 24 hour  Intake 1858.4 ml  Output      0 ml  Net 1858.4 ml    PHYSICAL EXAMINATION:  GENERAL:  75 y.o.-year-old patient lying in the bed with no acute distress.  EYES: Pupils equal, round, reactive to light and accommodation. No scleral icterus. Extraocular muscles intact.  HEENT: Head atraumatic, normocephalic. Oropharynx and nasopharynx clear.  NECK:  Supple, no jugular venous distention. No thyroid enlargement, no tenderness.  LUNGS: Normal breath sounds bilaterally, no wheezing, rales,rhonchi or crepitation. No use of accessory muscles of respiration.  CARDIOVASCULAR: S1, S2 normal. No murmurs, rubs, or gallops.  ABDOMEN: Soft, non-tender, non-distended. Bowel sounds present. No organomegaly or mass. Chronic right LLQ wound...healing well EXTREMITIES: No pedal edema, cyanosis, or clubbing.  NEUROLOGIC: Cranial nerves II through XII are intact. Muscle  strength 5/5 in all extremities. Sensation intact. Gait not checked.  PSYCHIATRIC: The patient is alert and oriented x 3.  SKIN: No obvious rash, lesion, or ulcer.   DATA REVIEW:   CBC   Recent Labs Lab 11/26/15 0837  WBC 11.4*  HGB 11.7*  HCT 35.8  PLT 272    Chemistries   Recent Labs Lab 11/25/15 1553 11/26/15 0837  NA 136 136  K 4.2 3.9  CL 103 112*  CO2 17* 14*  GLUCOSE 140* 159*  BUN 68* 51*  CREATININE 2.00* 1.37*  CALCIUM 10.3 9.3  AST 23  --   ALT 14  --   ALKPHOS 103  --   BILITOT 0.8  --     Microbiology Results   Recent Results (from the past 240 hour(s))  Urine culture     Status: Abnormal (Preliminary result)   Collection Time: 11/25/15  5:34 PM  Result Value Ref Range Status   Specimen Description URINE, CATHETERIZED  Final   Special Requests NONE  Final   Culture >=100,000 COLONIES/mL ESCHERICHIA COLI (A)  Final   Report Status PENDING  Incomplete  CULTURE, BLOOD (ROUTINE X 2) w Reflex to ID Panel     Status: None (Preliminary  result)   Collection Time: 11/25/15  7:40 PM  Result Value Ref Range Status   Specimen Description BLOOD L FA  Final   Special Requests BOTTLES DRAWN AEROBIC AND ANAEROBIC 8CC  Final   Culture NO GROWTH 2 DAYS  Final   Report Status PENDING  Incomplete  CULTURE, BLOOD (ROUTINE X 2) w Reflex to ID Panel     Status: None (Preliminary result)   Collection Time: 11/25/15  7:40 PM  Result Value Ref Range Status   Specimen Description BLOOD R FA  Final   Special Requests BOTTLES DRAWN AEROBIC AND ANAEROBIC 10CC  Final   Culture NO GROWTH 2 DAYS  Final   Report Status PENDING  Incomplete    RADIOLOGY:  Ct Abdomen Pelvis Wo Contrast  11/25/2015  CLINICAL DATA:  Lower abdominal wound. Generalized weakness and nausea. EXAM: CT ABDOMEN AND PELVIS WITHOUT CONTRAST TECHNIQUE: Multidetector CT imaging of the abdomen and pelvis was performed following the standard protocol without IV contrast. COMPARISON:  08/16/2015 FINDINGS: The anterior abdominal wall subcutaneous inflammation has partially resolved since 08/16/2015. On the midline sagittal image, the involved area now measures 3.5 x 8.0 cm and previously measured 4.6 x 12.8 cm. No large drainable collection is evident, but a small internal component of drainable material is not entirely excluded. Sensitivity is reduced in the absence of intravenous contrast. No evidence of extension of inflammation or abscess deep to the abdominal wall musculature. There are unremarkable unenhanced appearances of the liver, gallbladder, bile ducts, pancreas, spleen, adrenals and kidneys. Incidental unchanged 4.2 cm cyst of the left renal lower pole. Ureters and urinary bladder are unremarkable. The stomach, small bowel and colon are remarkable only for mild uncomplicated colonic diverticulosis. There is very extensive atherosclerotic calcification of the normal caliber aorta. Prior hysterectomy.  No adnexal abnormality. No ascites.  No adenopathy. No significant abnormality  in the lower chest. No significant skeletal lesion. IMPRESSION: 1. Extensive subcutaneous opacity and stranding associated with the low transverse abdominal wound, partially resolved from 08/16/2015. No large drainable component is evident but cannot exclude a small internal drainable component. No soft tissue gas. No extension deep to the abdominal wall musculature. 2. No acute findings in the peritoneum or retroperitoneum. Electronically Signed   By: Reuel Boom  Royce Macadamia M.D.   On: 11/25/2015 21:22   Dg Chest Portable 1 View  11/25/2015  CLINICAL DATA:  Weakness and dysuria.  Cough. EXAM: PORTABLE CHEST 1 VIEW COMPARISON:  September 22, 2015 FINDINGS: There is no edema or consolidation. Heart is slightly enlarged with pulmonary vascularity within normal limits. Pacemaker leads are attached to the right atrium and right ventricle. There is a coronary stent on the left. There is calcification in the aortic arch region. No adenopathy is evident. There is an old healed fracture of the proximal right humerus. IMPRESSION: No edema or consolidation. Mild cardiac enlargement. Aortic atherosclerosis. Pacemaker leads attached to right atrium and right ventricle. Coronary stent noted on the left. Electronically Signed   By: Bretta Bang III M.D.   On: 11/25/2015 17:03     Management plans discussed with the patient, family and they are in agreement.  CODE STATUS:     Code Status Orders        Start     Ordered   11/25/15 2021  Full code   Continuous     11/25/15 2020    Code Status History    Date Active Date Inactive Code Status Order ID Comments User Context   09/22/2015  8:29 PM 09/25/2015  2:52 PM Full Code [id number]  Alford Highland, MD ED   06/04/2015  4:30 AM 06/07/2015  7:22 PM Full Code [id number]  Alberteen Sam, MD Inpatient   03/16/2015  7:23 PM 03/19/2015  7:52 PM Full Code [id number]  Gale Journey, MD Inpatient      TOTAL TIME TAKING CARE OF THIS PATIENT: 40 minutes.     Azelyn Batie M.D on 11/27/2015 at 11:12 AM  Between 7am to 6pm - Pager - [phone] After 6pm go to [email or web address] - password [email or web address] [facility]  [place] [facility]  Office  [phone]  CC: Primary care physician; Leotis Shames, MD

## 2015-11-28 LAB — URINE CULTURE: Culture: >=100000 — AB

## 2015-11-30 LAB — CULTURE, BLOOD (ROUTINE X 2)
Culture: NO GROWTH
Culture: NO GROWTH

## 2015-12-18 ENCOUNTER — Inpatient Hospital Stay
Admission: EM | Admit: 2015-12-18 | Discharge: 2015-12-21 | DRG: 372 | Disposition: A | Discharge status: Home / Self Care | Payer: Medicare Other | Attending: Internal Medicine | Admitting: Internal Medicine

## 2015-12-18 DIAGNOSIS — Z9889 Other specified postprocedural states: Secondary | ICD-10-CM

## 2015-12-18 DIAGNOSIS — N390 Urinary tract infection, site not specified: Secondary | ICD-10-CM

## 2015-12-18 DIAGNOSIS — A047 Enterocolitis due to Clostridium difficile: Principal | ICD-10-CM | POA: Diagnosis present

## 2015-12-18 DIAGNOSIS — E785 Hyperlipidemia, unspecified: Secondary | ICD-10-CM | POA: Diagnosis present

## 2015-12-18 DIAGNOSIS — I739 Peripheral vascular disease, unspecified: Secondary | ICD-10-CM | POA: Diagnosis present

## 2015-12-18 DIAGNOSIS — E860 Dehydration: Secondary | ICD-10-CM | POA: Diagnosis present

## 2015-12-18 DIAGNOSIS — I130 Hypertensive heart and chronic kidney disease with heart failure and stage 1 through stage 4 chronic kidney disease, or unspecified chronic kidney disease: Secondary | ICD-10-CM | POA: Diagnosis present

## 2015-12-18 DIAGNOSIS — Z7982 Long term (current) use of aspirin: Secondary | ICD-10-CM

## 2015-12-18 DIAGNOSIS — Z8249 Family history of ischemic heart disease and other diseases of the circulatory system: Secondary | ICD-10-CM

## 2015-12-18 DIAGNOSIS — N183 Chronic kidney disease, stage 3 (moderate): Secondary | ICD-10-CM | POA: Diagnosis present

## 2015-12-18 DIAGNOSIS — R4781 Slurred speech: Secondary | ICD-10-CM

## 2015-12-18 DIAGNOSIS — R609 Edema, unspecified: Secondary | ICD-10-CM

## 2015-12-18 DIAGNOSIS — H409 Unspecified glaucoma: Secondary | ICD-10-CM | POA: Diagnosis present

## 2015-12-18 DIAGNOSIS — F329 Major depressive disorder, single episode, unspecified: Secondary | ICD-10-CM | POA: Diagnosis present

## 2015-12-18 DIAGNOSIS — Z79899 Other long term (current) drug therapy: Secondary | ICD-10-CM

## 2015-12-18 DIAGNOSIS — Z9049 Acquired absence of other specified parts of digestive tract: Secondary | ICD-10-CM

## 2015-12-18 DIAGNOSIS — I5032 Chronic diastolic (congestive) heart failure: Secondary | ICD-10-CM | POA: Diagnosis present

## 2015-12-18 DIAGNOSIS — I2510 Atherosclerotic heart disease of native coronary artery without angina pectoris: Secondary | ICD-10-CM | POA: Diagnosis present

## 2015-12-18 DIAGNOSIS — Z833 Family history of diabetes mellitus: Secondary | ICD-10-CM

## 2015-12-18 DIAGNOSIS — Z8673 Personal history of transient ischemic attack (TIA), and cerebral infarction without residual deficits: Secondary | ICD-10-CM

## 2015-12-18 DIAGNOSIS — Z825 Family history of asthma and other chronic lower respiratory diseases: Secondary | ICD-10-CM

## 2015-12-18 DIAGNOSIS — R531 Weakness: Secondary | ICD-10-CM

## 2015-12-18 DIAGNOSIS — Z955 Presence of coronary angioplasty implant and graft: Secondary | ICD-10-CM

## 2015-12-18 DIAGNOSIS — R197 Diarrhea, unspecified: Secondary | ICD-10-CM | POA: Diagnosis present

## 2015-12-18 DIAGNOSIS — M109 Gout, unspecified: Secondary | ICD-10-CM | POA: Diagnosis present

## 2015-12-18 DIAGNOSIS — Z820 Family history of epilepsy and other diseases of the nervous system: Secondary | ICD-10-CM

## 2015-12-18 DIAGNOSIS — Z950 Presence of cardiac pacemaker: Secondary | ICD-10-CM

## 2015-12-18 NOTE — ED Notes (Signed)
Per EMS, family states pt having increased slurred speech and weakness.  Pt alert and oriented.  Pt has residual R side weakness from 2008 stroke.  Pt states she feels slightly weaker than normal, but nothing else new at this time.  Per EMS pt's blood pressure on low side of normal.  Pt had a period of diarrhea earlier this evening, but pt denies any blood in stool.   Pt states it was just like water coming out.  Pt states she has had diarrhea 4-5 times in past 2 days. Pt alert and oriented but sleepy.

## 2015-12-19 ENCOUNTER — Encounter: Payer: Self-pay | Admitting: *Deleted

## 2015-12-19 ENCOUNTER — Emergency Department: Payer: Medicare Other

## 2015-12-19 ENCOUNTER — Inpatient Hospital Stay: Payer: Medicare Other

## 2015-12-19 DIAGNOSIS — Z8673 Personal history of transient ischemic attack (TIA), and cerebral infarction without residual deficits: Secondary | ICD-10-CM | POA: Diagnosis not present

## 2015-12-19 DIAGNOSIS — M109 Gout, unspecified: Secondary | ICD-10-CM | POA: Diagnosis present

## 2015-12-19 DIAGNOSIS — R4781 Slurred speech: Secondary | ICD-10-CM | POA: Diagnosis present

## 2015-12-19 DIAGNOSIS — H409 Unspecified glaucoma: Secondary | ICD-10-CM | POA: Diagnosis present

## 2015-12-19 DIAGNOSIS — Z79899 Other long term (current) drug therapy: Secondary | ICD-10-CM | POA: Diagnosis not present

## 2015-12-19 DIAGNOSIS — Z8249 Family history of ischemic heart disease and other diseases of the circulatory system: Secondary | ICD-10-CM | POA: Diagnosis not present

## 2015-12-19 DIAGNOSIS — I5032 Chronic diastolic (congestive) heart failure: Secondary | ICD-10-CM | POA: Diagnosis present

## 2015-12-19 DIAGNOSIS — Z950 Presence of cardiac pacemaker: Secondary | ICD-10-CM | POA: Diagnosis not present

## 2015-12-19 DIAGNOSIS — N183 Chronic kidney disease, stage 3 (moderate): Secondary | ICD-10-CM | POA: Diagnosis present

## 2015-12-19 DIAGNOSIS — F329 Major depressive disorder, single episode, unspecified: Secondary | ICD-10-CM | POA: Diagnosis present

## 2015-12-19 DIAGNOSIS — Z833 Family history of diabetes mellitus: Secondary | ICD-10-CM | POA: Diagnosis not present

## 2015-12-19 DIAGNOSIS — Z825 Family history of asthma and other chronic lower respiratory diseases: Secondary | ICD-10-CM | POA: Diagnosis not present

## 2015-12-19 DIAGNOSIS — R197 Diarrhea, unspecified: Secondary | ICD-10-CM | POA: Diagnosis present

## 2015-12-19 DIAGNOSIS — I130 Hypertensive heart and chronic kidney disease with heart failure and stage 1 through stage 4 chronic kidney disease, or unspecified chronic kidney disease: Secondary | ICD-10-CM | POA: Diagnosis present

## 2015-12-19 DIAGNOSIS — N390 Urinary tract infection, site not specified: Secondary | ICD-10-CM | POA: Diagnosis present

## 2015-12-19 DIAGNOSIS — Z9049 Acquired absence of other specified parts of digestive tract: Secondary | ICD-10-CM | POA: Diagnosis not present

## 2015-12-19 DIAGNOSIS — E860 Dehydration: Secondary | ICD-10-CM | POA: Diagnosis present

## 2015-12-19 DIAGNOSIS — A047 Enterocolitis due to Clostridium difficile: Secondary | ICD-10-CM | POA: Diagnosis present

## 2015-12-19 DIAGNOSIS — Z9889 Other specified postprocedural states: Secondary | ICD-10-CM | POA: Diagnosis not present

## 2015-12-19 DIAGNOSIS — Z820 Family history of epilepsy and other diseases of the nervous system: Secondary | ICD-10-CM | POA: Diagnosis not present

## 2015-12-19 DIAGNOSIS — Z955 Presence of coronary angioplasty implant and graft: Secondary | ICD-10-CM | POA: Diagnosis not present

## 2015-12-19 DIAGNOSIS — I2510 Atherosclerotic heart disease of native coronary artery without angina pectoris: Secondary | ICD-10-CM | POA: Diagnosis present

## 2015-12-19 DIAGNOSIS — Z7982 Long term (current) use of aspirin: Secondary | ICD-10-CM | POA: Diagnosis not present

## 2015-12-19 DIAGNOSIS — I739 Peripheral vascular disease, unspecified: Secondary | ICD-10-CM | POA: Diagnosis present

## 2015-12-19 DIAGNOSIS — E785 Hyperlipidemia, unspecified: Secondary | ICD-10-CM | POA: Diagnosis present

## 2015-12-19 LAB — C DIFFICILE QUICK SCREEN W PCR REFLEX
C DIFFICLE (CDIFF) ANTIGEN: POSITIVE — AB
C Diff toxin: NEGATIVE

## 2015-12-19 LAB — URINALYSIS COMPLETE WITH MICROSCOPIC (ARMC ONLY)
BILIRUBIN URINE: NEGATIVE
Broad Casts, UA: 4
GLUCOSE, UA: NEGATIVE mg/dL
HGB URINE DIPSTICK: NEGATIVE
KETONES UR: NEGATIVE mg/dL
NITRITE: NEGATIVE
Protein, ur: 100 mg/dL — AB
SPECIFIC GRAVITY, URINE: 1.012 (ref 1.005–1.030)
pH: 5 (ref 5.0–8.0)

## 2015-12-19 LAB — COMPREHENSIVE METABOLIC PANEL
ALBUMIN: 3.5 g/dL (ref 3.5–5.0)
ALK PHOS: 102 U/L (ref 38–126)
ALT: 12 U/L — ABNORMAL LOW (ref 14–54)
ANION GAP: 11 (ref 5–15)
AST: 25 U/L (ref 15–41)
BILIRUBIN TOTAL: 0.6 mg/dL (ref 0.3–1.2)
BUN: 49 mg/dL — ABNORMAL HIGH (ref 6–20)
CALCIUM: 9.5 mg/dL (ref 8.9–10.3)
CO2: 23 mmol/L (ref 22–32)
Chloride: 109 mmol/L (ref 101–111)
Creatinine, Ser: 1.81 mg/dL — ABNORMAL HIGH (ref 0.44–1.00)
GFR calc Af Amer: 31 mL/min — ABNORMAL LOW (ref >60)
GFR, EST NON AFRICAN AMERICAN: 27 mL/min — AB (ref >60)
GLUCOSE: 85 mg/dL (ref 65–99)
Potassium: 4.4 mmol/L (ref 3.5–5.1)
Sodium: 143 mmol/L (ref 135–145)
TOTAL PROTEIN: 6.8 g/dL (ref 6.5–8.1)

## 2015-12-19 LAB — GASTROINTESTINAL PANEL BY PCR, STOOL (REPLACES STOOL CULTURE)
ASTROVIRUS: NOT DETECTED
Adenovirus F40/41: NOT DETECTED
CYCLOSPORA CAYETANENSIS: NOT DETECTED
Campylobacter species: NOT DETECTED
Cryptosporidium: NOT DETECTED
E. COLI O157: NOT DETECTED
ENTAMOEBA HISTOLYTICA: NOT DETECTED
ENTEROTOXIGENIC E COLI (ETEC): NOT DETECTED
Enteroaggregative E coli (EAEC): NOT DETECTED
Enteropathogenic E coli (EPEC): NOT DETECTED
Giardia lamblia: NOT DETECTED
NOROVIRUS GI/GII: NOT DETECTED
Plesimonas shigelloides: NOT DETECTED
ROTAVIRUS A: NOT DETECTED
SALMONELLA SPECIES: NOT DETECTED
SAPOVIRUS (I, II, IV, AND V): NOT DETECTED
SHIGA LIKE TOXIN PRODUCING E COLI (STEC): NOT DETECTED
SHIGELLA/ENTEROINVASIVE E COLI (EIEC): NOT DETECTED
VIBRIO CHOLERAE: NOT DETECTED
VIBRIO SPECIES: NOT DETECTED
Yersinia enterocolitica: NOT DETECTED

## 2015-12-19 LAB — CBC
HEMATOCRIT: 32.6 % — AB (ref 35.0–47.0)
HEMOGLOBIN: 10.8 g/dL — AB (ref 12.0–16.0)
MCH: 30.6 pg (ref 26.0–34.0)
MCHC: 33.1 g/dL (ref 32.0–36.0)
MCV: 92.3 fL (ref 80.0–100.0)
Platelets: 278 10*3/uL (ref 150–440)
RBC: 3.54 MIL/uL — ABNORMAL LOW (ref 3.80–5.20)
RDW: 16.2 % — AB (ref 11.5–14.5)
WBC: 8 10*3/uL (ref 3.6–11.0)

## 2015-12-19 LAB — CLOSTRIDIUM DIFFICILE BY PCR: Toxigenic C. Difficile by PCR: POSITIVE — AB

## 2015-12-19 LAB — TROPONIN I

## 2015-12-19 MED ORDER — ISOSORBIDE MONONITRATE ER 60 MG PO TB24
60.0000 mg | ORAL_TABLET | Freq: Every day | ORAL | Status: DC
  Administered 2015-12-19 – 2015-12-21 (×3): 60 mg via ORAL
  Filled 2015-12-19 (×3): qty 1

## 2015-12-19 MED ORDER — FOLIC ACID 1 MG PO TABS
1000.0000 ug | ORAL_TABLET | Freq: Every day | ORAL | Status: DC
  Administered 2015-12-19 – 2015-12-21 (×3): 1 mg via ORAL
  Filled 2015-12-19 (×3): qty 1

## 2015-12-19 MED ORDER — ONDANSETRON HCL 4 MG PO TABS: 4.0000 mg | ORAL_TABLET | Freq: Four times a day (QID) | ORAL | Status: DC | PRN

## 2015-12-19 MED ORDER — VITAMIN D 1000 UNITS PO TABS
1000.0000 [IU] | ORAL_TABLET | Freq: Every day | ORAL | Status: DC
  Administered 2015-12-19 – 2015-12-21 (×3): 1000 [IU] via ORAL
  Filled 2015-12-19 (×3): qty 1

## 2015-12-19 MED ORDER — ASPIRIN EC 81 MG PO TBEC
81.0000 mg | DELAYED_RELEASE_TABLET | Freq: Every day | ORAL | Status: DC
  Administered 2015-12-19 – 2015-12-21 (×3): 81 mg via ORAL
  Filled 2015-12-19 (×3): qty 1

## 2015-12-19 MED ORDER — MELATONIN 3 MG PO TABS
3.0000 mg | ORAL_TABLET | Freq: Every evening | ORAL | Status: DC | PRN
  Filled 2015-12-19: qty 1

## 2015-12-19 MED ORDER — HYDRALAZINE HCL 50 MG PO TABS
100.0000 mg | ORAL_TABLET | Freq: Three times a day (TID) | ORAL | Status: DC
  Administered 2015-12-19 – 2015-12-21 (×7): 100 mg via ORAL
  Filled 2015-12-19 (×7): qty 2

## 2015-12-19 MED ORDER — ONDANSETRON HCL 4 MG/2ML IJ SOLN
4.0000 mg | Freq: Four times a day (QID) | INTRAMUSCULAR | Status: DC | PRN
  Administered 2015-12-20: 4 mg via INTRAVENOUS
  Filled 2015-12-19: qty 2

## 2015-12-19 MED ORDER — PANTOPRAZOLE SODIUM 40 MG PO TBEC
40.0000 mg | DELAYED_RELEASE_TABLET | Freq: Every day | ORAL | Status: DC
  Administered 2015-12-19 – 2015-12-21 (×3): 40 mg via ORAL
  Filled 2015-12-19 (×3): qty 1

## 2015-12-19 MED ORDER — MELATONIN 5 MG PO TABS
5.0000 mg | ORAL_TABLET | Freq: Every evening | ORAL | Status: DC | PRN
  Filled 2015-12-19: qty 1

## 2015-12-19 MED ORDER — DEXTROSE 5 % IV SOLN
1.0000 g | Freq: Once | INTRAVENOUS | Status: AC
  Administered 2015-12-19: 1 g via INTRAVENOUS
  Filled 2015-12-19: qty 10

## 2015-12-19 MED ORDER — PRAVASTATIN SODIUM 20 MG PO TABS
20.0000 mg | ORAL_TABLET | Freq: Every day | ORAL | Status: DC
  Administered 2015-12-19 – 2015-12-20 (×2): 20 mg via ORAL
  Filled 2015-12-19 (×2): qty 1

## 2015-12-19 MED ORDER — CLOPIDOGREL BISULFATE 75 MG PO TABS
75.0000 mg | ORAL_TABLET | Freq: Every day | ORAL | Status: DC
  Administered 2015-12-19 – 2015-12-21 (×3): 75 mg via ORAL
  Filled 2015-12-19 (×3): qty 1

## 2015-12-19 MED ORDER — POTASSIUM CHLORIDE CRYS ER 20 MEQ PO TBCR
20.0000 meq | EXTENDED_RELEASE_TABLET | Freq: Two times a day (BID) | ORAL | Status: DC
  Administered 2015-12-19 – 2015-12-21 (×5): 20 meq via ORAL
  Filled 2015-12-19 (×5): qty 1

## 2015-12-19 MED ORDER — METRONIDAZOLE 500 MG PO TABS
500.0000 mg | ORAL_TABLET | Freq: Three times a day (TID) | ORAL | Status: DC
  Administered 2015-12-19 – 2015-12-21 (×7): 500 mg via ORAL
  Filled 2015-12-19 (×7): qty 1

## 2015-12-19 MED ORDER — SENNA 8.6 MG PO TABS: 1.0000 | ORAL_TABLET | Freq: Every evening | ORAL | Status: DC | PRN

## 2015-12-19 MED ORDER — FERROUS SULFATE 220 (44 FE) MG/5ML PO ELIX
142.0000 mg | ORAL_SOLUTION | Freq: Two times a day (BID) | ORAL | Status: DC
  Administered 2015-12-19 – 2015-12-21 (×5): 142 mg via ORAL
  Filled 2015-12-19 (×6): qty 3.3

## 2015-12-19 MED ORDER — PAROXETINE HCL 20 MG PO TABS
40.0000 mg | ORAL_TABLET | Freq: Every day | ORAL | Status: DC
  Administered 2015-12-19 – 2015-12-20 (×2): 40 mg via ORAL
  Filled 2015-12-19 (×2): qty 2

## 2015-12-19 MED ORDER — ACETAMINOPHEN 325 MG PO TABS: 650.0000 mg | ORAL_TABLET | Freq: Four times a day (QID) | ORAL | Status: DC | PRN

## 2015-12-19 MED ORDER — HYDROCODONE-ACETAMINOPHEN 5-325 MG PO TABS: 1.0000 | ORAL_TABLET | ORAL | Status: DC | PRN

## 2015-12-19 MED ORDER — ENOXAPARIN SODIUM 30 MG/0.3ML ~~LOC~~ SOLN
30.0000 mg | SUBCUTANEOUS | Status: DC
  Administered 2015-12-19 – 2015-12-21 (×3): 30 mg via SUBCUTANEOUS
  Filled 2015-12-19 (×3): qty 0.3

## 2015-12-19 MED ORDER — PILOCARPINE HCL 1 % OP SOLN
1.0000 [drp] | Freq: Four times a day (QID) | OPHTHALMIC | Status: DC
  Administered 2015-12-19 – 2015-12-21 (×9): 1 [drp] via OPHTHALMIC
  Filled 2015-12-19: qty 15

## 2015-12-19 MED ORDER — METOPROLOL SUCCINATE ER 50 MG PO TB24
50.0000 mg | ORAL_TABLET | Freq: Every day | ORAL | Status: DC
  Administered 2015-12-19 – 2015-12-21 (×3): 50 mg via ORAL
  Filled 2015-12-19 (×3): qty 1

## 2015-12-19 MED ORDER — DEXTROSE 5 % IV SOLN
1.0000 g | INTRAVENOUS | Status: DC
  Administered 2015-12-20: 1 g via INTRAVENOUS
  Filled 2015-12-19 (×2): qty 10

## 2015-12-19 MED ORDER — ACETAMINOPHEN 650 MG RE SUPP: 650.0000 mg | Freq: Four times a day (QID) | RECTAL | Status: DC | PRN

## 2015-12-19 MED ORDER — ALLOPURINOL 100 MG PO TABS
100.0000 mg | ORAL_TABLET | Freq: Every day | ORAL | Status: DC
  Administered 2015-12-19 – 2015-12-21 (×3): 100 mg via ORAL
  Filled 2015-12-19 (×3): qty 1

## 2015-12-19 MED ORDER — GEMFIBROZIL 600 MG PO TABS
600.0000 mg | ORAL_TABLET | Freq: Two times a day (BID) | ORAL | Status: DC
  Administered 2015-12-19 – 2015-12-21 (×4): 600 mg via ORAL
  Filled 2015-12-19 (×5): qty 1

## 2015-12-19 MED ORDER — ADULT MULTIVITAMIN W/MINERALS CH
1.0000 | ORAL_TABLET | Freq: Every day | ORAL | Status: DC
  Administered 2015-12-19 – 2015-12-21 (×3): 1 via ORAL
  Filled 2015-12-19 (×3): qty 1

## 2015-12-19 MED ORDER — SODIUM CHLORIDE 0.9 % IV SOLN
INTRAVENOUS | Status: DC
  Administered 2015-12-19: 11:00:00 via INTRAVENOUS

## 2015-12-19 MED ORDER — ONDANSETRON HCL 4 MG PO TABS
4.0000 mg | ORAL_TABLET | Freq: Three times a day (TID) | ORAL | Status: DC | PRN
Start: 2015-12-19 — End: 2015-12-21

## 2015-12-19 MED ORDER — AMLODIPINE BESYLATE 5 MG PO TABS
2.5000 mg | ORAL_TABLET | Freq: Every day | ORAL | Status: DC
  Administered 2015-12-19 – 2015-12-21 (×3): 2.5 mg via ORAL
  Filled 2015-12-19 (×3): qty 1

## 2015-12-19 NOTE — ED Notes (Signed)
Daughter Asher Muir notified of patient's admission.

## 2015-12-19 NOTE — Evaluation (Signed)
Physical Therapy Evaluation Patient Details Name: Marguerete Laverdure MRN: [id number] DOB: March 23, 1941 Today's Date: 12/19/2015   History of Present Illness  75 yo female here with diarrhea. PMH includes CAD, stroke, PVD, CHF, pacemaker, and renal insufficiency.  Clinical Impression  Pt here with diarrhea and though she is feeling a little weak she overall does well and shows good confidence with mobility and ambulation.  She had a CVA in 2000 that does cause her to time and renders her R UE very limited, but overall she did well, was safe with ambulation and is eager to get back home as soon as possible with HHPT.    Follow Up Recommendations Home health PT;Supervision - Intermittent    Equipment Recommendations  None recommended by PT    Recommendations for Other Services       Precautions / Restrictions Precautions Precautions: Fall Restrictions Weight Bearing Restrictions: No      Mobility  Bed Mobility Overal bed mobility: Modified Independent Bed Mobility: Supine to Sit     Supine to sit: Supervision     General bed mobility comments: Pt able to get up to sitting w/ rail use, but does not need direct assist  Transfers Overall transfer level: Modified independent Equipment used: Rolling walker (2 wheeled) Transfers: Sit to/from Stand Sit to Stand: Min guard         General transfer comment: Pt rises with good confidence and does not rely heavily on walker for balance   Ambulation/Gait Ambulation/Gait assistance: Min guard Ambulation Distance (Feet): 75 Feet Assistive device: Rolling walker (2 wheeled)       General Gait Details: baseline R sided limp from CVA, otherwise she reports she is near her baseline and she does not have safety concerns, shows good confidence and only minimal fatigue with the effort   Stairs            Wheelchair Mobility    Modified Rankin (Stroke Patients Only)       Balance Overall balance assessment: Modified  Independent   Sitting balance-Leahy Scale: Good       Standing balance-Leahy Scale: Good                               Pertinent Vitals/Pain Pain Assessment: No/denies pain    Home Living Family/patient expects to be discharged to:: Private residence Living Arrangements: Children Available Help at Discharge: Family;Available PRN/intermittently Type of Home: House Home Access: Level entry     Home Layout: Two level;1/2 bath on main level Home Equipment: Walker - 2 wheels;Walker - 4 wheels;Tub bench;Grab bars - tub/shower;Wheelchair - manual      Prior Function Level of Independence: Independent with assistive device(s)         Comments: Pt reports that she is able to get out of the house when daughter takes her     Hand Dominance   Dominant Hand: Left    Extremity/Trunk Assessment   Upper Extremity Assessment: RUE deficits/detail RUE Deficits / Details: RUE  shoulder limitations in flexion and abduction from a previous CVA. 3/5 elbow flexion, 3=/5 wrist extension.        LUE Deficits / Details: LUE ROM and strength WFL   Lower Extremity Assessment: RLE deficits/detail RLE Deficits / Details: decreased R knee and ankle A/PROM from previous stroke       Communication   Communication: No difficulties  Cognition Arousal/Alertness: Awake/alert Behavior During Therapy: WFL for tasks assessed/performed Overall Cognitive  Status: Within Functional Limits for tasks assessed                      General Comments      Exercises        Assessment/Plan    PT Assessment Patient needs continued PT services  PT Diagnosis Difficulty walking;Generalized weakness   PT Problem List Decreased strength;Decreased range of motion;Decreased activity tolerance;Decreased balance  PT Treatment Interventions Gait training;Stair training;Therapeutic activities;Therapeutic exercise;Balance training;Neuromuscular re-education;Patient/family education   PT  Goals (Current goals can be found in the Care Plan section) Acute Rehab PT Goals Patient Stated Goal: go home PT Goal Formulation: With patient Time For Goal Achievement: 01/01/16 Potential to Achieve Goals: Fair    Frequency Min 2X/week   Barriers to discharge        Co-evaluation               End of Session Equipment Utilized During Treatment: Gait belt Activity Tolerance: Patient tolerated treatment well Patient left: with bed alarm set;with call bell/phone within reach           Time: 1452-1513 PT Time Calculation (min) (ACUTE ONLY): 21 min   Charges:   PT Evaluation $PT Eval Low Complexity: 1 Procedure     PT G CodesMalachi Pro, DPT 12/19/2015, 4:35 PM

## 2015-12-19 NOTE — ED Notes (Addendum)
Patient's daughters name is Dickey Gave and her number is [phone].  She is also POA and would like to be updated with any changes or updates on patient.

## 2015-12-19 NOTE — Progress Notes (Signed)
Pt's daughter approached me and spoke to me re her mother's care; pt's daughter asked me to have Dr call her; I paged Dr Juliene Pina and asked her to call pt's daughter; Dr did call and spoke to pt's daughter; I received call from Dr Juliene Pina following Dr's call to pt's daughter, new orders for urinalysis, blood cultures, CXR, and Head CT received from Dr Juliene Pina

## 2015-12-19 NOTE — ED Notes (Signed)
Night shift RN called pt's daughter/POA to give an update on admission status.

## 2015-12-19 NOTE — NC FL2 (Signed)
[place] MEDICAID FL2 LEVEL OF CARE SCREENING TOOL     IDENTIFICATION  Patient Name: Sally Curry Birthdate: Mar 05, 1941 Sex: female Admission Date (Current Location): 12/18/2015  [place] Number:  Chiropodist and Address:  [facility], [address], [place], [place] [address]      Provider Number: [id number]  Attending Physician Name and Address:  Adrian Saran, MD  Relative Name and Phone Number:       Current Level of Care: Hospital Recommended Level of Care: Skilled Nursing Facility Prior Approval Number:    Date Approved/Denied:   PASRR Number:  [id number])  Discharge Plan: SNF    Current Diagnoses: Patient Active Problem List   Diagnosis Date Noted  . Diarrhea 12/19/2015  . Malignant essential hypertension 11/25/2015  . Generalized weakness 11/25/2015  . Sepsis (HCC) 11/25/2015  . Leukocytosis 11/25/2015  . Nausea and vomiting 11/25/2015  . Acute on chronic renal failure (HCC) 11/25/2015  . Dehydration 11/25/2015  . History of CVA with residual deficit 10/04/2015  . CHF (congestive heart failure) (HCC) 09/22/2015  . UTI (lower urinary tract infection) 06/04/2015  . Duodenal ulcer with hemorrhage 01/20/2014  . CAD (coronary artery disease), native coronary artery 01/20/2014  . Essential hypertension, benign 01/20/2014  . Dyslipidemia 01/20/2014  . Hypokalemia 01/20/2014  . Glaucoma 01/20/2014  . Depression with anxiety 01/20/2014    Orientation RESPIRATION BLADDER Height & Weight     Self, Situation, Place  Normal Continent Weight: 150 lb 12.8 oz (68.4 kg) Height:  5' (152.4 cm)  BEHAVIORAL SYMPTOMS/MOOD NEUROLOGICAL BOWEL NUTRITION STATUS   (None)  (None) Continent Diet (Soft)  AMBULATORY STATUS COMMUNICATION OF NEEDS Skin   Limited Assist Verbally Other (Comment) (Closed Incision Right lower abdomen    )                       Personal Care Assistance Level of Assistance  Bathing, Feeding,  Dressing Bathing Assistance: Limited assistance Feeding assistance: Independent Dressing Assistance: Limited assistance     Functional Limitations Info  Sight, Hearing, Speech Sight Info: Adequate Hearing Info: Adequate Speech Info: Adequate    SPECIAL CARE FACTORS FREQUENCY  PT (By licensed PT)     PT Frequency:  (5)              Contractures      Additional Factors Info  Code Status, Allergies, Isolation Precautions Code Status Info:  (Full Code) Allergies Info:  (No Known Allergies)     Isolation Precautions Info:  (Contact Isolation: Enteric)     Current Medications (12/19/2015):  This is the current hospital active medication list Current Facility-Administered Medications  Medication Dose Route Frequency Provider Last Rate Last Dose  . 0.9 %  sodium chloride infusion   Intravenous Continuous Adrian Saran, MD 75 mL/hr at 12/19/15 1055    . acetaminophen (TYLENOL) tablet 650 mg  650 mg Oral Q6H PRN Adrian Saran, MD       Or  . acetaminophen (TYLENOL) suppository 650 mg  650 mg Rectal Q6H PRN Adrian Saran, MD      . allopurinol (ZYLOPRIM) tablet 100 mg  100 mg Oral Daily Sital Mody, MD   100 mg at 12/19/15 1056  . amLODipine (NORVASC) tablet 2.5 mg  2.5 mg Oral Daily Sital Mody, MD   2.5 mg at 12/19/15 1057  . aspirin EC tablet 81 mg  81 mg Oral Daily Sital Mody, MD   81 mg at 12/19/15 1056  . [  START ON 12/20/2015] cefTRIAXone (ROCEPHIN) 1 g in dextrose 5 % 50 mL IVPB  1 g Intravenous Q24H Sital Mody, MD      . cholecalciferol (VITAMIN D) tablet 1,000 Units  1,000 Units Oral Daily Adrian Saran, MD   1,000 Units at 12/19/15 1057  . clopidogrel (PLAVIX) tablet 75 mg  75 mg Oral Daily Adrian Saran, MD   75 mg at 12/19/15 1056  . enoxaparin (LOVENOX) injection 30 mg  30 mg Subcutaneous Q24H Sital Mody, MD   30 mg at 12/19/15 1056  . ferrous sulfate 220 (44 Fe) MG/5ML solution 142 mg  142 mg Oral BID Adrian Saran, MD   142 mg at 12/19/15 1055  . folic acid (FOLVITE) tablet 1 mg  1,000  mcg Oral Daily Adrian Saran, MD   1 mg at 12/19/15 1057  . gemfibrozil (LOPID) tablet 600 mg  600 mg Oral BID AC Adrian Saran, MD   600 mg at 12/19/15 1605  . hydrALAZINE (APRESOLINE) tablet 100 mg  100 mg Oral TID Adrian Saran, MD   100 mg at 12/19/15 1605  . HYDROcodone-acetaminophen (NORCO/VICODIN) 5-325 MG per tablet 1-2 tablet  1-2 tablet Oral Q4H PRN Adrian Saran, MD      . isosorbide mononitrate (IMDUR) 24 hr tablet 60 mg  60 mg Oral Daily Sital Mody, MD   60 mg at 12/19/15 1056  . Melatonin TABS 5 mg  5 mg Oral QHS PRN Foye Deer, RPH      . metoprolol succinate (TOPROL-XL) 24 hr tablet 50 mg  50 mg Oral Daily Sital Mody, MD   50 mg at 12/19/15 1057  . multivitamin with minerals tablet 1 tablet  1 tablet Oral Daily Adrian Saran, MD   1 tablet at 12/19/15 1057  . ondansetron (ZOFRAN) tablet 4 mg  4 mg Oral Q6H PRN Adrian Saran, MD       Or  . ondansetron (ZOFRAN) injection 4 mg  4 mg Intravenous Q6H PRN Sital Mody, MD      . ondansetron (ZOFRAN) tablet 4 mg  4 mg Oral Q8H PRN Sital Mody, MD      . pantoprazole (PROTONIX) EC tablet 40 mg  40 mg Oral Daily Sital Mody, MD   40 mg at 12/19/15 1056  . PARoxetine (PAXIL) tablet 40 mg  40 mg Oral QHS Sital Mody, MD      . pilocarpine (PILOCAR) 1 % ophthalmic solution 1 drop  1 drop Both Eyes QID Adrian Saran, MD   1 drop at 12/19/15 1057  . potassium chloride SA (K-DUR,KLOR-CON) CR tablet 20 mEq  20 mEq Oral BID Adrian Saran, MD   20 mEq at 12/19/15 1057  . pravastatin (PRAVACHOL) tablet 20 mg  20 mg Oral q1800 Sital Mody, MD      . senna (SENOKOT) tablet 8.6 mg  1 tablet Oral QHS PRN Adrian Saran, MD         Discharge Medications: Please see discharge summary for a list of discharge medications.  Relevant Imaging Results:  Relevant Lab Results:   Additional Information  (SSN [id number])  Verta Ellen Christpoher Sievers, LCSW

## 2015-12-19 NOTE — ED Notes (Signed)
REDS VEST READING= 36 CHEST RULER= 11  VEST FITTING TASKS: POSTURE= SIT HEIGHT MARKER= S CENTER STRIP= A  COMMENTS:approved by [employer]

## 2015-12-19 NOTE — H&P (Signed)
[facility] - [facility]   PATIENT NAME: Sally Curry    MR#:  [id number]  DATE OF BIRTH:  October 31, 1940  DATE OF ADMISSION:  12/18/2015  PRIMARY CARE PHYSICIAN: Leotis Shames, MD   REQUESTING/REFERRING PHYSICIAN: Dr Lenard Lance  CHIEF COMPLAINT:    Diarrhea and weakness HISTORY OF PRESENT ILLNESS:  Sally Curry  is a 75 y.o. female with a known history of  Chronic diastolic heart failure, ASCVD and CKD stage III who presents with diarrhea and weakness. Patient was in the hospital earlier this month for weakness and found to have Escherichia coli UTI. She was discharged on Keflex. She presents today with 3 days of diarrhea. UA today also shows urinary tract infection. She denies melena, hematochezia or abdominal pain. She denies fever or chills. She denies dysuria or frequency.  PAST MEDICAL HISTORY:   Past Medical History:  Diagnosis Date  . Anemia   . Anxiety   . CAD (coronary artery disease)   . CHF (congestive heart failure) (HCC)   . Chronic kidney disease   . Depression   . Glaucoma   . Gout   . Hypertension   . Presence of permanent cardiac pacemaker   . PVD (peripheral vascular disease) (HCC)   . Renal insufficiency   . Stroke [facility])     PAST SURGICAL HISTORY:   Past Surgical History:  Procedure Laterality Date  . ABDOMINAL SURGERY    . APPENDECTOMY    . CAROTID ENDARTERECTOMY    . CHOLECYSTECTOMY    . CORONARY ANGIOPLASTY WITH STENT PLACEMENT    . PACEMAKER INSERTION    . PERCUTANEOUS PLACEMENT INTRAVASCULAR STENT CERVICAL CAROTID ARTERY      SOCIAL HISTORY:   Social History  Substance Use Topics  . Smoking status: Never Smoker  . Smokeless tobacco: Never Used  . Alcohol use No    FAMILY HISTORY:   Family History  Problem Relation Age of Onset  . Hypertension Mother   . Parkinson's disease Mother   . Emphysema Father   . Diabetes Sister     DRUG ALLERGIES:  No Known Allergies  REVIEW OF SYSTEMS:   Review of  Systems  Constitutional: Positive for malaise/fatigue. Negative for chills and fever.  HENT: Negative.  Negative for ear discharge, ear pain, hearing loss, nosebleeds and sore throat.   Eyes: Negative.  Negative for blurred vision and pain.  Respiratory: Negative.  Negative for cough, hemoptysis, shortness of breath and wheezing.   Cardiovascular: Negative.  Negative for chest pain, palpitations and leg swelling.  Gastrointestinal: Positive for diarrhea. Negative for abdominal pain, blood in stool, nausea and vomiting.  Genitourinary: Negative.  Negative for dysuria.  Musculoskeletal: Negative.  Negative for back pain.  Skin: Negative.   Neurological: Positive for weakness. Negative for dizziness, tremors, speech change, focal weakness, seizures and headaches.  Endo/Heme/Allergies: Negative.  Does not bruise/bleed easily.  Psychiatric/Behavioral: Negative.  Negative for depression, hallucinations and suicidal ideas.    MEDICATIONS AT HOME:   Prior to Admission medications   Medication Sig Start Date End Date Taking? Authorizing Provider  allopurinol (ZYLOPRIM) 100 MG tablet Take 100 mg by mouth daily.   Yes Historical Provider, MD  amLODipine (NORVASC) 2.5 MG tablet Take 1 tablet (2.5 mg total) by mouth daily. 03/19/15  Yes Leotis Shames, MD  aspirin EC 81 MG tablet Take 81 mg by mouth daily.   Yes Historical Provider, MD  clopidogrel (PLAVIX) 75 MG tablet Take 75 mg by mouth daily.   Yes Historical  Provider, MD  escitalopram (LEXAPRO) 10 MG tablet Take 10 mg by mouth daily. 12/15/15  Yes Historical Provider, MD  furosemide (LASIX) 20 MG tablet Take 20 mg by mouth daily. 10/18/15  Yes Historical Provider, MD  gemfibrozil (LOPID) 600 MG tablet Take 600 mg by mouth 2 (two) times daily before a meal.   Yes Historical Provider, MD  hydrALAZINE (APRESOLINE) 100 MG tablet Take 100 mg by mouth 3 (three) times daily. 09/15/15  Yes Historical Provider, MD  isosorbide mononitrate (IMDUR) 60 MG 24 hr  tablet Take 60 mg by mouth daily. 10/18/15  Yes Historical Provider, MD  lovastatin (MEVACOR) 40 MG tablet Take 40 mg by mouth every evening. 10/18/15  Yes Historical Provider, MD  metoprolol succinate (TOPROL-XL) 50 MG 24 hr tablet Take 50 mg by mouth daily. 09/15/15  Yes Historical Provider, MD  nystatin cream (MYCOSTATIN) Apply 1 application topically 2 (two) times daily as needed for rash. 09/15/15  Yes Historical Provider, MD  omeprazole (PRILOSEC) 40 MG capsule Take 40 mg by mouth daily.   Yes Historical Provider, MD  ondansetron (ZOFRAN) 4 MG tablet Take 4 mg by mouth every 8 (eight) hours as needed for nausea or vomiting.   Yes Historical Provider, MD  PARoxetine (PAXIL) 10 MG tablet Take 10 mg by mouth daily. 12/15/15  Yes Historical Provider, MD  pilocarpine (PILOCAR) 1 % ophthalmic solution Place 1 drop into both eyes 4 (four) times daily.   Yes Historical Provider, MD  potassium chloride SA (K-DUR,KLOR-CON) 20 MEQ tablet Take 20 mEq by mouth 2 (two) times daily. 10/18/15  Yes Historical Provider, MD  traMADol (ULTRAM) 50 MG tablet Take 50 mg by mouth every 8 (eight) hours as needed for pain. 12/15/15  Yes Historical Provider, MD      VITAL SIGNS:  Blood pressure (!) 139/51, pulse 75, temperature 97.8 F (36.6 C), temperature source Oral, resp. rate 16, height 5' (1.524 m), weight 68.4 kg (150 lb 12.8 oz), SpO2 93 %.  PHYSICAL EXAMINATION:   Physical Exam  Constitutional: She is oriented to person, place, and time and well-developed, well-nourished, and in no distress. No distress.  HENT:  Head: Normocephalic.  Eyes: No scleral icterus.  Neck: Normal range of motion. Neck supple. No JVD present. No tracheal deviation present.  Cardiovascular: Normal rate, regular rhythm and normal heart sounds.  Exam reveals no gallop and no friction rub.   No murmur heard. Pulmonary/Chest: Effort normal and breath sounds normal. No respiratory distress. She has no wheezes. She has no rales. She  exhibits no tenderness.  Abdominal: Soft. Bowel sounds are normal. She exhibits no distension and no mass. There is no tenderness. There is no rebound and no guarding.  Musculoskeletal: Normal range of motion. She exhibits no edema.  Neurological: She is alert and oriented to person, place, and time.  Skin: Skin is warm. No rash noted. No erythema.  Psychiatric: Affect and judgment normal.      LABORATORY PANEL:   CBC  Recent Labs Lab 12/19/15 0000  WBC 8.0  HGB 10.8*  HCT 32.6*  PLT 278   ------------------------------------------------------------------------------------------------------------------  Chemistries   Recent Labs Lab 12/19/15 0000  NA 143  K 4.4  CL 109  CO2 23  GLUCOSE 85  BUN 49*  CREATININE 1.81*  CALCIUM 9.5  AST 25  ALT 12*  ALKPHOS 102  BILITOT 0.6   ------------------------------------------------------------------------------------------------------------------  Cardiac Enzymes  Recent Labs Lab 12/19/15 0000  TROPONINI <0.03   ------------------------------------------------------------------------------------------------------------------  RADIOLOGY:  US Venous  Img Lower Unilateral Right  Result Date: 12/19/2015 CLINICAL DATA:  Right leg swelling. EXAM: RIGHT LOWER EXTREMITY VENOUS DOPPLER ULTRASOUND TECHNIQUE: Gray-scale sonography with graded compression, as well as color Doppler and duplex ultrasound were performed to evaluate the lower extremity deep venous systems from the level of the common femoral vein and including the common femoral, femoral, profunda femoral, popliteal and calf veins including the posterior tibial, peroneal and gastrocnemius veins when visible. The superficial great saphenous vein was also interrogated. Spectral Doppler was utilized to evaluate flow at rest and with distal augmentation maneuvers in the common femoral, femoral and popliteal veins. COMPARISON:  None. FINDINGS: Contralateral Common Femoral Vein:  Respiratory phasicity is normal and symmetric with the symptomatic side. No evidence of thrombus. Normal compressibility. Common Femoral Vein: No evidence of thrombus. Normal compressibility, respiratory phasicity and response to augmentation. Saphenofemoral Junction: No evidence of thrombus. Normal compressibility and flow on color Doppler imaging. Profunda Femoral Vein: No evidence of thrombus. Normal compressibility and flow on color Doppler imaging. Femoral Vein: No evidence of thrombus. Normal compressibility, respiratory phasicity and response to augmentation. Popliteal Vein: No evidence of thrombus. Normal compressibility, respiratory phasicity and response to augmentation. Calf Veins: No evidence of thrombus. Normal compressibility and flow on color Doppler imaging. Superficial Great Saphenous Vein: No evidence of thrombus. Normal compressibility and flow on color Doppler imaging. Venous Reflux:  None. Other Findings:  None. IMPRESSION: No evidence of deep venous thrombosis. Electronically Signed   By: Bary Richard M.D.   On: 12/19/2015 07:33   EKG:  Normal sinus rhythm heart rate 80 no ST elevation or depression  IMPRESSION AND PLAN:    75 year old female with history of chronic diastolic heart failure on chronic kidney disease stage III who presents with generalized weakness and diarrhea.    1. Diarrhea: Check C. difficile as she was recently hospitalized in on antibiotics.  2. Urinary tract infection: Continue Rocephin and follow up on final urine culture.  3. Acute on chronic kidney disease stage III: This is due to dehydration. Continue IV fluids and repeat BMP in a.m. Avoid nephrotoxic agents.  4. Essential hypertension: Continue metoprolol, isosorbide and hydralazine.  5. Hyperlipidemia: Continue pravastatin and gemfibrozil.  6. Depression: Continue Paxil.  All the records are reviewed and case discussed with ED provider. Management plans discussed with the patient and she in  agreement  CODE STATUS: full  TOTAL TIME TAKING CARE OF THIS PATIENT: 55 minutes.    Lakeria Starkman M.D on 12/19/2015 at 10:00 AM  Between 7am to 6pm - Pager - [phone]  After 6pm go to [email or web address] - Social research officer, government  [facility]  Office  [phone]  CC: Primary care physician; Leotis Shames, MD

## 2015-12-19 NOTE — ED Notes (Signed)
See paper chart for down time notes.

## 2015-12-19 NOTE — Care Management Important Message (Signed)
Important Message  Patient Details  Name: Sally Curry MRN: [id number] Date of Birth: Nov 18, 1940   Medicare Important Message Given:  Yes    Melisse Caetano A, RN 12/19/2015, 4:10 PM

## 2015-12-19 NOTE — Progress Notes (Signed)
Pharmacy Antibiotic Note  Sally Curry is a 75 y.o. female admitted on 12/18/2015 with UTI.  Pharmacy has been consulted for Ceftriaxone dosing. Patient received Ceftriaxone 1g  IV x1 dose in ED.   Plan: Will start patient on Ceftriaxone 1g IV daily.   Height: 5' (152.4 cm) Weight: 150 lb 12.8 oz (68.4 kg) IBW/kg (Calculated) : 45.5  Temp (24hrs), Avg:97.8 F (36.6 C), Min:97.8 F (36.6 C), Max:97.8 F (36.6 C)   Recent Labs Lab 12/19/15 0000  WBC 8.0  CREATININE 1.81*    Estimated Creatinine Clearance: 23.9 mL/min (by C-G formula based on SCr of 1.81 mg/dL).    No Known Allergies  Antimicrobials this admission: 7/23 Ceftriaxone >>   Dose adjustments this admission:   Microbiology results: 7/23 UCx: pending   Thank you for allowing pharmacy to be a part of this patient's care.  Cher Nakai, PharmD Clinical Pharmacist 12/19/2015 10:28 AM

## 2015-12-19 NOTE — ED Notes (Signed)
Pt sleeping comfortably at this time.

## 2015-12-19 NOTE — ED Notes (Signed)
1L NS bolus completed and disconnected from pt.

## 2015-12-19 NOTE — ED Provider Notes (Signed)
[facility] Emergency Department Provider Note  Time seen: 12:11 AM  I have reviewed the triage vital signs and the nursing notes.   HISTORY  Chief Complaint Weakness    HPI Sally Curry is a 75 y.o. female with a past medical history of hypertension, CVA, anemia, CK D, anxiety, CHF, depression, who presents to the emergency department generalized weakness and diarrhea. Patient states she is having diarrhea every 10-15 minutes today. Patient was recently treated with antibiotics for urinary tract infection. Patient denies any abdominal pain. Denies any chest pain. Denies any fever, cough or congestion. Denies nausea or vomiting.      Past Medical History  Diagnosis Date  . CAD (coronary artery disease)   . Hypertension   . Stroke (HCC)   . Gout   . PVD (peripheral vascular disease) (HCC)   . Anemia   . Chronic kidney disease   . Anxiety   . CHF (congestive heart failure) (HCC)   . Depression   . Glaucoma   . Presence of permanent cardiac pacemaker   . Renal insufficiency     Patient Active Problem List   Diagnosis Date Noted  . Malignant essential hypertension 11/25/2015  . Generalized weakness 11/25/2015  . Sepsis (HCC) 11/25/2015  . Leukocytosis 11/25/2015  . Nausea and vomiting 11/25/2015  . Acute on chronic renal failure (HCC) 11/25/2015  . Dehydration 11/25/2015  . History of CVA with residual deficit 10/04/2015  . CHF (congestive heart failure) (HCC) 09/22/2015  . UTI (lower urinary tract infection) 06/04/2015  . Duodenal ulcer with hemorrhage 01/20/2014  . CAD (coronary artery disease), native coronary artery 01/20/2014  . Essential hypertension, benign 01/20/2014  . Dyslipidemia 01/20/2014  . Hypokalemia 01/20/2014  . Glaucoma 01/20/2014  . Depression with anxiety 01/20/2014    Past Surgical History  Procedure Laterality Date  . Coronary angioplasty with stent placement    . Pacemaker insertion    . Cholecystectomy    .  Carotid endarterectomy    . Percutaneous placement intravascular stent cervical carotid artery    . Appendectomy    . Abdominal surgery      Current Outpatient Rx  Name  Route  Sig  Dispense  Refill  . allopurinol (ZYLOPRIM) 100 MG tablet   Oral   Take 100 mg by mouth daily.         Marland Kitchen amLODipine (NORVASC) 2.5 MG tablet   Oral   Take 1 tablet (2.5 mg total) by mouth daily.   30 tablet   3   . aspirin EC 81 MG tablet   Oral   Take 81 mg by mouth daily.         . cephALEXin (KEFLEX) 500 MG capsule   Oral   Take 1 capsule (500 mg total) by mouth 2 (two) times daily.   12 capsule   0   . cholecalciferol (VITAMIN D) 1000 units tablet   Oral   Take 1,000 Units by mouth daily.         . clopidogrel (PLAVIX) 75 MG tablet   Oral   Take 75 mg by mouth daily.         . folic acid (FOLVITE) 1 MG tablet   Oral   Take 800 mcg by mouth daily.          . furosemide (LASIX) 20 MG tablet   Oral   Take 1 tablet (20 mg total) by mouth daily.   30 tablet      .  gemfibrozil (LOPID) 600 MG tablet   Oral   Take 600 mg by mouth 2 (two) times daily before a meal.         . hydrALAZINE (APRESOLINE) 100 MG tablet   Oral   Take 100 mg by mouth 3 (three) times daily.         . Iron, Ferrous Sulfate, 142 (45 Fe) MG TBCR   Oral   Take 142 mg by mouth 2 (two) times daily.   60 tablet   0   . isosorbide mononitrate (IMDUR) 60 MG 24 hr tablet   Oral   Take 60 mg by mouth daily.         Marland Kitchen lovastatin (MEVACOR) 40 MG tablet   Oral   Take 40 mg by mouth at bedtime.         . Melatonin 3 MG TABS   Oral   Take 3 mg by mouth at bedtime as needed (for sleep).         . metoprolol succinate (TOPROL-XL) 50 MG 24 hr tablet   Oral   Take 50 mg by mouth daily. Take with or immediately following a meal.         . Multiple Vitamin (MULTIVITAMIN WITH MINERALS) TABS tablet   Oral   Take 1 tablet by mouth daily.         Marland Kitchen nystatin cream (MYCOSTATIN)   Topical    Apply 1 application topically 2 (two) times daily as needed (for rash/itching).          Marland Kitchen omeprazole (PRILOSEC) 40 MG capsule   Oral   Take 40 mg by mouth daily.         . ondansetron (ZOFRAN) 4 MG tablet   Oral   Take 4 mg by mouth every 8 (eight) hours as needed for nausea or vomiting.         Marland Kitchen PARoxetine (PAXIL) 40 MG tablet   Oral   Take 40 mg by mouth at bedtime.         . pilocarpine (PILOCAR) 1 % ophthalmic solution   Both Eyes   Place 1 drop into both eyes 4 (four) times daily.         . potassium chloride SA (K-DUR,KLOR-CON) 20 MEQ tablet   Oral   Take 1 tablet (20 mEq total) by mouth 2 (two) times daily.         Marland Kitchen senna (SENOKOT) 8.6 MG tablet   Oral   Take 1 tablet by mouth at bedtime as needed for constipation.           Allergies Review of patient's allergies indicates no known allergies.  Family History  Problem Relation Age of Onset  . Hypertension Mother   . Parkinson's disease Mother   . Emphysema Father   . Diabetes Sister     Social History Social History  Substance Use Topics  . Smoking status: Never Smoker   . Smokeless tobacco: Never Used  . Alcohol Use: No    Review of Systems Constitutional: Negative for fever.Positive for generalized weakness. Cardiovascular: Negative for chest pain. Respiratory: Negative for shortness of breath. Gastrointestinal: Negative for abdominal pain. Positive for diarrhea. Musculoskeletal: Negative for back pain Neurological: Negative for headache 10-point ROS otherwise negative.  ____________________________________________   PHYSICAL EXAM:  VITAL SIGNS: ED Triage Vitals  Enc Vitals Group     BP 12/18/15 2335 111/51 mmHg     Pulse Rate 12/18/15 2335 79     Resp 12/18/15  2335 18     Temp 12/18/15 2335 97.8 F (36.6 C)     Temp Source 12/18/15 2335 Oral     SpO2 12/18/15 2335 91 %     Weight 12/18/15 2335 145 lb (65.772 kg)     Height 12/18/15 2335 5' (1.524 m)     Head Cir --       Peak Flow --      Pain Score 12/18/15 2342 8     Pain Loc --      Pain Edu? --      Excl. in GC? --     Constitutional: Alert and oriented. Well appearing and in no distress. Eyes: Normal exam ENT   Head: Normocephalic and atraumatic.   Mouth/Throat: Mucous membranes are moist. Cardiovascular: Normal rate, regular rhythm. No murmur Respiratory: Normal respiratory effort without tachypnea nor retractions. Breath sounds are clear  Gastrointestinal: Soft and nontender. No distention.  Patient does have a surgical wound to the right lower quadrant with a small amount of drainage which she states they have been doing dressing changes, but she does not recall what surgery was. Musculoskeletal: Nontender with normal range of motion in all extremities.  Neurologic:  Normal speech and language. No gross focal neurologic deficits Skin:  Skin is warm, dry and intact.  Psychiatric: Mood and affect are normal.   ____________________________________________    EKG  EKG reviewed and interpreted by myself shows normal sinus rhythm at 80 bpm, narrow QRS, normal axis, normal intervals, no concerning ST changes noted.  ____________________________________________    RADIOLOGY  Ultrasound pending. CT head shows no acute abnormality  ____________________________________________    INITIAL IMPRESSION / ASSESSMENT AND PLAN / ED COURSE  Pertinent labs & imaging results that were available during my care of the patient were reviewed by me and considered in my medical decision making (see chart for details).  The patient presents to the emergency department generalized weakness, diarrhea for the past 2 days. The patient has recently been on antibiotics for urinary tract infection. Given her generalized weakness we will check labs, stool studies, urinalysis, IV hydrate and closely monitor in the emergency department. Currently awaiting the daughter's arrival for further history.   See  downtime paper chart. In summary patient found to have a urinary tract infection, given her generalized weakness patient admitted to the hospital for further treatment.  ____________________________________________   FINAL CLINICAL IMPRESSION(S) / ED DIAGNOSES  Generalized weakness Diarrhea   Minna Antis, MD 12/19/15 [phone]

## 2015-12-19 NOTE — Clinical Social Work Note (Signed)
Clinical Social Work Assessment  Patient Details  Name: Sally Curry MRN: [id number] Date of Birth: 02-19-1941  Date of referral:  12/19/15               Reason for consult:  Discharge Planning                Permission sought to share information with:  Family Supports Permission granted to share information::  Yes, Verbal Permission Granted  Name::        Agency::     Relationship::   Sally Curry- Daughter)  Contact Information:     Housing/Transportation Living arrangements for the past 2 months:  Single Family Home Source of Information:  Patient, Adult Children, Power of Attorney Patient Interpreter Needed:  None Criminal Activity/Legal Involvement Pertinent to Current Situation/Hospitalization:  No - Comment as needed Significant Relationships:  Adult Children Lives with:  Adult Children Do you feel safe going back to the place where you live?  Yes Need for family participation in patient care:  Yes (Comment) Sally Curry- Daughter/ POA)  Care giving concerns:  CSW received a consult stating that patient's daughter would like information on SNF/ ALF placement    Social Worker assessment / plan:  CSW met with patient and her daughter at bedside. CSW introduced herself and her role. Per patient's daughter Sally Curry she is patient's POA. She reported that patient has been in and out of the hospital and rehab since Feb. She stated that she would like to get patient into the [facility] program. Reported that a referral to PACE was made by their [facility] social worker through [facility]. Stated that she wanted a follow up on the referral. Reported that she would be agreeable for patient to go back to rehab but would like to see what PT recommends. Stated that patient is open with [facility]. Reported that she's not ready to make discharge plans until PT informs her of what patient can do. 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 CSW will continue to follow and assist.   Employment status:  Retired Forensic scientist:  Medicare PT Recommendations:  Not assessed at this time Information / Referral to community resources:     Patient/Family's Response to care:  Patient's daughter reported she'd like CSW to follow up on PACE referral and await PTs recommendations for patient. There is a PT consult currently pending.   Patient/Family's Understanding of and Emotional Response to Diagnosis, Current Treatment, and Prognosis:  Patient's daughter reports that she understands patient's diagnosis, current treatment and prognosis. Reported she'd like more assistance with patient during the day that's why she began a PACE referral. Reports she appreciates CSW's assistance   Emotional Assessment Appearance:  Appears stated age Attitude/Demeanor/Rapport:   (None) Affect (typically observed):  Accepting, Calm, Pleasant Orientation:  Oriented to Self, Oriented to Place, Oriented to Situation Alcohol / Substance use:  Not Applicable Psych involvement (Current and /or in the community):  No (Comment)  Discharge Needs  Concerns to be addressed:  Discharge Planning Concerns Readmission within the last 30 days:  No Current discharge risk:  Chronically ill Barriers to Discharge:  Continued Medical Work up   [employer], LCSW 12/19/2015, 4:05 PM

## 2015-12-19 NOTE — Progress Notes (Signed)
I spoke with patient's daughter. Her concern is that patient has not been doing well since her surgery. Most recently she is worried about eitherTIA and CHF. Patient herself has not exhibited any signs of TIA/CA or CHF. Head CT last night shows an old CVA nothing acute. On exam patient has not focal defecits or signs of neurological event. Her lungs are clear this morning without signs of CHF. Her daughter would like to use CHF vest if available. I called tele unit and ED at this time it does not appear that we have CHF vest available to [place]. However, I will consult Dr Lady Gary her cardiologist tomorrow t come and see her. I will also order CXR to eval for CHF. Her daughter was concerned also about possible blood stream infection from most recent surgery. She demonstrates no fever or elevation in WBC no signs of infection at surgical site (removal of fat and hernia repair in 2/17). Blood cx ordered in any case. Repeat UA as well as daughter reporting normal UA on Wednesday. However given her diarrhea, she may have contracted another UTI.   Plan d/w nurse as well TME 25 minutes

## 2015-12-20 LAB — CBC
HCT: 29.6 % — ABNORMAL LOW (ref 35.0–47.0)
Hemoglobin: 9.9 g/dL — ABNORMAL LOW (ref 12.0–16.0)
MCH: 31 pg (ref 26.0–34.0)
MCHC: 33.5 g/dL (ref 32.0–36.0)
MCV: 92.8 fL (ref 80.0–100.0)
PLATELETS: 242 10*3/uL (ref 150–440)
RBC: 3.19 MIL/uL — AB (ref 3.80–5.20)
RDW: 16.7 % — ABNORMAL HIGH (ref 11.5–14.5)
WBC: 11.8 10*3/uL — ABNORMAL HIGH (ref 3.6–11.0)

## 2015-12-20 LAB — BASIC METABOLIC PANEL
Anion gap: 4 — ABNORMAL LOW (ref 5–15)
BUN: 38 mg/dL — AB (ref 6–20)
CHLORIDE: 120 mmol/L — AB (ref 101–111)
CO2: 18 mmol/L — AB (ref 22–32)
CREATININE: 1.12 mg/dL — AB (ref 0.44–1.00)
Calcium: 9.1 mg/dL (ref 8.9–10.3)
GFR calc non Af Amer: 48 mL/min — ABNORMAL LOW (ref >60)
GFR, EST AFRICAN AMERICAN: 55 mL/min — AB (ref >60)
GLUCOSE: 111 mg/dL — AB (ref 65–99)
Potassium: 4.3 mmol/L (ref 3.5–5.1)
Sodium: 142 mmol/L (ref 135–145)

## 2015-12-20 MED ORDER — METRONIDAZOLE 500 MG PO TABS: 500.0000 mg | ORAL_TABLET | Freq: Three times a day (TID) | ORAL | 0 refills | Status: AC

## 2015-12-20 MED ORDER — CEFUROXIME AXETIL 500 MG PO TABS: 500.0000 mg | ORAL_TABLET | Freq: Two times a day (BID) | ORAL | 0 refills | Status: DC

## 2015-12-20 NOTE — Care Management (Signed)
Patient admitted from home with diarrhea.  Patient  Lives at home with her daughter.  Patient states that she has a walker, and RW walker in the home.  Daughter provides transportation.  Obtains medications at [facility].  Denies any issues obtaining medications.  Patient open with [facility] for PT, OT, Aide, and SW.  Home health orders placed.  RN added.   Barbara Cower with [facility] notified. Daughter Asher Muir has questions regarding [facility] referral. Will notify her that she will need to follow up with [facility] outpatient, and her SW could assist.

## 2015-12-20 NOTE — Progress Notes (Signed)
Physical Therapy Treatment Patient Details Name: Galena Logie MRN: [id number] DOB: July 14, 1940 Today's Date: 12/20/2015    History of Present Illness 75 yo female presenting with diarrhea; admitted with CDiff. PMH includes CAD, stroke, PVD, CHF, pacemaker, and renal insufficiency.    PT Comments    Pt's daughter was present at time of treatment and expressed concern regarding the pt's ability to ambulate/navigate stairs safely. Pt was able to ambulate 12ft with RW and CGA safely without significant instability or LOB. Pt's bedroom/half bath are on the first story of a level entry home; but she does go upstairs 2-3x/wk for showering (6 stairs; platform/turn left; 6 more stairs). PT provided education regarding safe mobility techniques and DME use. Will attempt to follow-up tomorrow to assess safety on the stairs, but it may be that the pt sponge bath on first floor initially until she gets stronger/more confident to navigate stairs. Maintain recommendation for home with RW, HHPT and intermittent supervision.    Follow Up Recommendations  Home health PT;Supervision - Intermittent     Equipment Recommendations  None recommended by PT (pt has equipment)  Pt's daughter to bring in pt's RW prior to d/c   Recommendations for Other Services       Precautions / Restrictions Precautions Precautions: Fall Restrictions Weight Bearing Restrictions: No    Mobility  Bed Mobility General bed mobility comments: Received in chair, returned to chair; not assessed  Transfers Overall transfer level: Modified independent Equipment used: Rolling walker (2 wheeled) Transfers: Sit to/from Stand General transfer comment: Pt performs sit <> stand transfers without physical assist or cueing.  Ambulation/Gait Ambulation/Gait assistance: Min guard Ambulation Distance (Feet): 90 Feet Assistive device: Rolling walker (2 wheeled) Gait Pattern/deviations: Step-to pattern;Decreased stride length Gait  velocity: reduced   General Gait Details: Baseline limp d/t decreased R knee extension as result of prior CVA. Otherwise, no significant instability or LOB. Safe use of DME.   Stairs    Wheelchair Mobility    Modified Rankin (Stroke Patients Only)          Cognition Arousal/Alertness: Awake/alert Behavior During Therapy: WFL for tasks assessed/performed Overall Cognitive Status: Within Functional Limits for tasks assessed      General Comments Pt's daughter with request for pt to have "an actual shower" and increased use of bedside commode. RN notified.  Pt and daughter educated regarding in safe mobility techniques including using RW over rollator, checking RW for fit, and appropriate use of RW. Pt and pt's daughter verbalize/demonstrate understanding.      Pertinent Vitals/Pain Pain Assessment: No/denies pain  Pre session vitals: HR 66 O2 95% Post session vitals: HR 64 O2 96%    Home Living Level entry home; bedroom and 1/2 bath on main level Shower upstairs (2-3x/week) with shower chair.  6 steps, then a platform (with a chair for seated rest) and left turn, then 6 more steps.       PT Goals (current goals can now be found in the care plan section) Acute Rehab PT Goals Patient Stated Goal: go home PT Goal Formulation: With patient Time For Goal Achievement: 01/01/16 Potential to Achieve Goals: Fair Progress towards PT goals: Progressing toward goals    Frequency  Min 2X/week    PT Plan Current plan remains appropriate       End of Session Equipment Utilized During Treatment: Gait belt Activity Tolerance: Patient tolerated treatment well Patient left: in chair;with chair alarm set;with call bell/phone within reach     Time: 1478-2956 PT Time  Calculation (min) (ACUTE ONLY): 23 min  Charges:                       G Codes:      Coltan Spinello, SPT 12/20/2015, 4:21 PM

## 2015-12-20 NOTE — Progress Notes (Signed)
Daughter worried that patient may go into CHF given her CHF vest reproting 36.  Patient currently asymptomatic, however due to recent hospitization patient at risk of readmission. Will suspend discharge for today.  PT to reeval as well.

## 2015-12-20 NOTE — Discharge Summary (Addendum)
[facility] [place] [facility]   PATIENT NAME: Sally Curry    MR#:  [id number]  DATE OF BIRTH:  Dec 23, 1940  DATE OF ADMISSION:  12/18/2015 ADMITTING PHYSICIAN: Adrian Saran, MD  DATE OF DISCHARGE: 12/21/2015  PRIMARY CARE PHYSICIAN: Singh,Jasmine, MD    ADMISSION DIAGNOSIS:  Slurred speech [R47.81] Swelling [R60.9] Weakness [R53.1] UTI (lower urinary tract infection) [N39.0]  DISCHARGE DIAGNOSIS:  Active Problems:  C. difficile Diarrhea   SECONDARY DIAGNOSIS:   Past Medical History:  Diagnosis Date  . Anemia   . Anxiety   . CAD (coronary artery disease)   . CHF (congestive heart failure) (HCC)   . Chronic kidney disease   . Depression   . Glaucoma   . Gout   . Hypertension   . Presence of permanent cardiac pacemaker   . PVD (peripheral vascular disease) (HCC)   . Renal insufficiency   . Stroke [facility])     HOSPITAL COURSE:   75 year old female with history of chronic diastolic heart failure on chronic kidney disease stage III who presents with generalized weakness and diarrhea.    1. C. difficile Diarrhea: Patient presented with diarrhea. She was recently on antibiotics and recently hospitalized for urinary tract infection. She is started on Flagyl for C. Difficile Diarrhea, as this is her first episode. Diarrhea has improved.  2. Urinary tract infection: She was recently treated for Escherichia coli urinary tract infection with Keflex. Urine culture was sensitive to cephalosporins. She had a urinary tract infection this time likely due to diarrhea as the initial source.  She was started on Rocephin and will be discharged on Ceftin.   3. Acute on chronic kidney disease stage III: This is due to dehydration. Her creatinine is now normalized after IV fluids.   4. Essential hypertension: Continue metoprolol, isosorbide and hydralazine.  5. Hyperlipidemia: Continue pravastatin and gemfibrozil.  6. Depression: Continue Paxil. 7.  Diastolic heart failure with preserved ejection fraction: CHF vest was utilized during this hospital stay. It did not appear that patient had high risk for readmission for CHF and to the reading. Chest x-ray showed no evidence of CHF.   8. Generalized weakness: Patient's daughter was concerned patient may have had TIA due to her generalized weakness. She had no focal neurological deficits. CT head initially showed no acute changes. Her generalized weakness was due to diarrhea and dehydration. Physical therapy did recommend home with home health.   DISCHARGE CONDITIONS AND DIET:   Stable for discharge on heart healthy diet  CONSULTS OBTAINED:    DRUG ALLERGIES:  No Known Allergies  DISCHARGE MEDICATIONS:   Current Discharge Medication List    START taking these medications   Details  cefUROXime (CEFTIN) 500 MG tablet Take 1 tablet (500 mg total) by mouth 2 (two) times daily with a meal. Qty: 12 tablet, Refills: 0    metroNIDAZOLE (FLAGYL) 500 MG tablet Take 1 tablet (500 mg total) by mouth 3 (three) times daily. Qty: 42 tablet, Refills: 0      CONTINUE these medications which have CHANGED   Details  cholecalciferol (VITAMIN D) 1000 units tablet Take 1 tablet (1,000 Units total) by mouth daily. Qty: 30 tablet, Refills: 0    folic acid (FOLVITE) 1 MG tablet Take 1 tablet (1 mg total) by mouth daily. Qty: 30 tablet, Refills: 0    Iron, Ferrous Sulfate, 142 (45 Fe) MG TBCR Take 142 mg by mouth 2 (two) times daily. Qty: 60 tablet, Refills: 0    Multiple  Vitamin (MULTIVITAMIN WITH MINERALS) TABS tablet Take 1 tablet by mouth daily. Qty: 120 tablet, Refills: 0      CONTINUE these medications which have NOT CHANGED   Details  allopurinol (ZYLOPRIM) 100 MG tablet Take 100 mg by mouth daily.    amLODipine (NORVASC) 2.5 MG tablet Take 1 tablet (2.5 mg total) by mouth daily. Qty: 30 tablet, Refills: 3    aspirin EC 81 MG tablet Take 81 mg by mouth daily.    clopidogrel  (PLAVIX) 75 MG tablet Take 75 mg by mouth daily.    furosemide (LASIX) 20 MG tablet Take 20 mg by mouth daily.    gemfibrozil (LOPID) 600 MG tablet Take 600 mg by mouth 2 (two) times daily before a meal.    hydrALAZINE (APRESOLINE) 100 MG tablet Take 100 mg by mouth 3 (three) times daily.    isosorbide mononitrate (IMDUR) 60 MG 24 hr tablet Take 60 mg by mouth daily.    lovastatin (MEVACOR) 40 MG tablet Take 40 mg by mouth every evening.    metoprolol succinate (TOPROL-XL) 50 MG 24 hr tablet Take 50 mg by mouth daily.    nystatin cream (MYCOSTATIN) Apply 1 application topically 2 (two) times daily as needed for rash.    omeprazole (PRILOSEC) 40 MG capsule Take 40 mg by mouth daily.    ondansetron (ZOFRAN) 4 MG tablet Take 4 mg by mouth every 8 (eight) hours as needed for nausea or vomiting.    PARoxetine (PAXIL) 10 MG tablet Take 10 mg by mouth daily.    pilocarpine (PILOCAR) 1 % ophthalmic solution Place 1 drop into both eyes 4 (four) times daily.    potassium chloride SA (K-DUR,KLOR-CON) 20 MEQ tablet Take 20 mEq by mouth 2 (two) times daily.    traMADol (ULTRAM) 50 MG tablet Take 50 mg by mouth every 8 (eight) hours as needed for pain.      STOP taking these medications     escitalopram (LEXAPRO) 10 MG tablet      Melatonin 3 MG TABS      senna (SENOKOT) 8.6 MG tablet               Today   CHIEF COMPLAINT:   Patient doing very well this morning and would like to go home if possible. Diarrhea has resolved.   VITAL SIGNS:  Blood pressure (!) 153/58, pulse 76, temperature 98.5 F (36.9 C), temperature source Oral, resp. rate 18, height 5' (1.524 m), weight 68.4 kg (150 lb 12.8 oz), SpO2 95 %.   REVIEW OF SYSTEMS:  Review of Systems  Constitutional: Negative.  Negative for chills, fever and malaise/fatigue.  HENT: Negative.  Negative for ear discharge, ear pain, hearing loss, nosebleeds and sore throat.   Eyes: Negative.  Negative for blurred vision and  pain.  Respiratory: Negative.  Negative for cough, hemoptysis, shortness of breath and wheezing.   Cardiovascular: Negative.  Negative for chest pain, palpitations and leg swelling.  Gastrointestinal: Negative.  Negative for abdominal pain, blood in stool, diarrhea (improved), nausea and vomiting.  Genitourinary: Negative.  Negative for dysuria.  Musculoskeletal: Negative.  Negative for back pain.  Skin: Negative.   Neurological: Negative for dizziness, tremors, speech change, focal weakness, seizures and headaches.  Endo/Heme/Allergies: Negative.  Does not bruise/bleed easily.  Psychiatric/Behavioral: Negative.  Negative for depression, hallucinations and suicidal ideas.     PHYSICAL EXAMINATION:  GENERAL:  75 y.o.-year-old patient lying in the bed with no acute distress.  NECK:  Supple, no jugular  venous distention. No thyroid enlargement, no tenderness.  LUNGS: Normal breath sounds bilaterally, no wheezing, rales,rhonchi  No use of accessory muscles of respiration.  CARDIOVASCULAR: S1, S2 normal. 2/6 murmur No, rubs, or gallops.  ABDOMEN: Soft, non-tender, non-distended. Bowel sounds present. No organomegaly or mass.  EXTREMITIES: No pedal edema, cyanosis, or clubbing.  PSYCHIATRIC: The patient is alert and oriented x 3.  SKIN: No obvious rash, lesion, or ulcer.   DATA REVIEW:   CBC  Recent Labs Lab 12/20/15 0540  WBC 11.8*  HGB 9.9*  HCT 29.6*  PLT 242    Chemistries   Recent Labs Lab 12/19/15 0000 12/20/15 0540  NA 143 142  K 4.4 4.3  CL 109 120*  CO2 23 18*  GLUCOSE 85 111*  BUN 49* 38*  CREATININE 1.81* 1.12*  CALCIUM 9.5 9.1  AST 25  --   ALT 12*  --   ALKPHOS 102  --   BILITOT 0.6  --     Cardiac Enzymes  Recent Labs Lab 12/19/15 0000  TROPONINI <0.03    Microbiology Results  @[email or web address] @  RADIOLOGY:  Dg Chest 2 View  Result Date: 12/19/2015 CLINICAL DATA:  Diarrhea, weakness EXAM: CHEST  2 VIEW COMPARISON:  11/25/2015 FINDINGS: Lungs  are clear.  No pleural effusion or pneumothorax. Cardiomegaly.  Coronary stent.  Left subclavian pacemaker. Degenerative changes of the visualized thoracolumbar spine. IMPRESSION: No evidence of acute cardiopulmonary disease. Electronically Signed   By: Charline Bills M.D.   On: 12/19/2015 13:55     Management plans discussed with the patient and she is in agreement. Stable for discharge   Patient should follow up with PCP 1 week Discussed plan with daughter CODE STATUS:     Code Status Orders        Start     Ordered   12/19/15 0958  Full code  Continuous     12/19/15 0957    Code Status History    Date Active Date Inactive Code Status Order ID Comments User Context   11/25/2015  8:20 PM 11/27/2015  8:23 PM Full Code [id number]  Katharina Caper, MD Inpatient   09/22/2015  8:29 PM 09/25/2015  2:52 PM Full Code [id number]  Alford Highland, MD ED   06/04/2015  4:30 AM 06/07/2015  7:22 PM Full Code [id number]  Alberteen Sam, MD Inpatient   03/16/2015  7:23 PM 03/19/2015  7:52 PM Full Code [id number]  Gale Journey, MD Inpatient    Advance Directive Documentation   Flowsheet Row Most Recent Value  Type of Advance Directive  Healthcare Power of Attorney  Pre-existing out of facility DNR order (yellow form or pink MOST form)  No data  "MOST" Form in Place?  No data      TOTAL TIME TAKING CARE OF THIS PATIENT: 35 minutes.    Note: This dictation was prepared with Dragon dictation along with smaller phrase technology. Any transcriptional errors that result from this process are unintentional.  Armonie Mettler M.D on 12/23/2015 at 09:08  Between 7am to 6pm - Pager - [phone] After 6pm go to [email or web address] - Social research officer, government  [facility]  Office  [phone]  CC: Primary care physician; Leotis Shames, MD

## 2015-12-21 LAB — URINE CULTURE

## 2015-12-21 MED ORDER — ADULT MULTIVITAMIN W/MINERALS CH: 1.0000 | ORAL_TABLET | Freq: Every day | ORAL | 0 refills | Status: AC

## 2015-12-21 MED ORDER — VITAMIN D 1000 UNITS PO TABS: 1000.0000 [IU] | ORAL_TABLET | Freq: Every day | ORAL | 0 refills | Status: AC

## 2015-12-21 MED ORDER — IRON (FERROUS SULFATE) 142 (45 FE) MG PO TBCR: 142.0000 mg | EXTENDED_RELEASE_TABLET | Freq: Two times a day (BID) | ORAL | 0 refills | Status: DC

## 2015-12-21 MED ORDER — FOLIC ACID 1 MG PO TABS: 1000.0000 ug | ORAL_TABLET | Freq: Every day | ORAL | 0 refills | Status: AC

## 2015-12-21 NOTE — Progress Notes (Signed)
Physical Therapy Treatment Patient Details Name: Sally Curry MRN: [id number] DOB: 11/04/1940 Today's Date: 12/21/2015    History of Present Illness 75 yo female here with diarrhea. PMH includes CAD, stroke, PVD, CHF, pacemaker, and renal insufficiency.    PT Comments    Pt gradually progressing towards functional goals. She was able to increase total amount of ambulation this session with standing therapeutic rest breaks for energy conservation. Pt navigated steps with one rail and min guard. Min cues for safety during session. Plan to progress mobility as tolerated.  Follow Up Recommendations  Home health PT;Supervision - Intermittent     Equipment Recommendations  None recommended by PT    Recommendations for Other Services       Precautions / Restrictions Precautions Precautions: Fall Restrictions Weight Bearing Restrictions: No    Mobility  Bed Mobility Overal bed mobility: Modified Independent Bed Mobility: Supine to Sit     Supine to sit: Modified independent (Device/Increase time)     General bed mobility comments: uses rail, extra time needed due to decreased use of R UE  Transfers Overall transfer level: Needs assistance Equipment used: Rolling walker (2 wheeled) Transfers: Sit to/from [employer] Sit to Stand: Min guard Stand pivot transfers: Min guard       General transfer comment: steady with no LOB, extra time needed due to decreased use of R UE  Ambulation/Gait Ambulation/Gait assistance: Min guard Ambulation Distance (Feet): 100 Feet Assistive device: Rolling walker (2 wheeled) Gait Pattern/deviations: Step-to pattern;Decreased dorsiflexion - right Gait velocity: reduced   General Gait Details: Steady with fair speed. Limps due to decreased R knee ext from prior stroke   Stairs Stairs: Yes Stairs assistance: Min guard Stair Management: One rail Left Number of Stairs: 5 General stair comments: 1 UE support descending,  2 UE support ascending, steady with no LOB  Wheelchair Mobility    Modified Rankin (Stroke Patients Only)       Balance Overall balance assessment: Needs assistance Sitting-balance support: No upper extremity supported Sitting balance-Leahy Scale: Good Sitting balance - Comments: steady with no LOB   Standing balance support: Single extremity supported Standing balance-Leahy Scale: Good Standing balance comment: steady with no LOB                    Cognition Arousal/Alertness: Awake/alert Behavior During Therapy: WFL for tasks assessed/performed Overall Cognitive Status: Within Functional Limits for tasks assessed                      Exercises Other Exercises Other Exercises: Pt ambulated 100 ft, 20ft, 50 ft, 100 ft, 69ft, and 70 ft with FWW and min guard. Standing rest breaks between for energy conservation. Steady with no LOB. Cues for pursed lip breathing when pt is SOB. Other Exercises: Pt navigated 5 steps with L rail (down) with min guard. 1 UE support descending and 2 UE support ascending. Steady with no LOB. Increased time requiring due to fatigue. Pt felt more comfortable going down with the L LE first instead of the weaker leg. Educated to hold rail with both UEs for increased support and to take rest breaks as needed at home.    General Comments General comments (skin integrity, edema, etc.): frequent rest breaks for energy conservation      Pertinent Vitals/Pain Pain Assessment: No/denies pain    Home Living  Prior Function            PT Goals (current goals can now be found in the care plan section) Acute Rehab PT Goals Patient Stated Goal: go home PT Goal Formulation: With patient Time For Goal Achievement: 01/01/16 Potential to Achieve Goals: Fair Progress towards PT goals: Progressing toward goals    Frequency  Min 2X/week    PT Plan Current plan remains appropriate    Co-evaluation              End of Session Equipment Utilized During Treatment: Gait belt Activity Tolerance: Patient tolerated treatment well Patient left: in chair;with chair alarm set;with call bell/phone within reach     Time: 0930-0954 PT Time Calculation (min) (ACUTE ONLY): 24 min  Charges:  $Gait Training: 23-37 mins                    G Codes:      Adelene Idler, PT, DPT  12/21/15, 10:09 AM [phone]

## 2015-12-21 NOTE — Care Management Note (Signed)
Case Management Note  Patient Details  Name: Sally Curry MRN: [id number] Date of Birth: Nov 22, 1940  Subjective/Objective:    Call to [facility] per [name] is discharging home today with home health. Already open to [facility] for PT, RN, Aide, SW, and OT.                 Action/Plan:   Expected Discharge Date:                  Expected Discharge Plan:     In-House Referral:     Discharge planning Services     Post Acute Care Choice:    Choice offered to:     DME Arranged:    DME Agency:     HH Arranged:    HH Agency:     Status of Service:     If discussed at [employer] of Stay Meetings, dates discussed:    Additional Comments:  Saunders Arlington A, RN 12/21/2015, 10:16 AM

## 2015-12-21 NOTE — Progress Notes (Signed)
Pt stable. IV removed. D/c instructions given and education provided. Signed prescriptions verified and given. Pt states she understands instructions. Pt dressed and escorted out by staff. Driven home by family.  

## 2015-12-21 NOTE — Progress Notes (Signed)
[employer] [facility] [facility] [place] [facility]   PATIENT NAME: Sally Curry    MR#:  [id number]  DATE OF BIRTH:  1940/09/14  SUBJECTIVE:  Diarrhea resolving  REVIEW OF SYSTEMS:    Review of Systems  Constitutional: Negative.  Negative for chills, fever and malaise/fatigue.  HENT: Negative.  Negative for ear discharge, ear pain, hearing loss, nosebleeds and sore throat.   Eyes: Negative.  Negative for blurred vision and pain.  Respiratory: Negative.  Negative for cough, hemoptysis, shortness of breath and wheezing.   Cardiovascular: Negative.  Negative for chest pain, palpitations and leg swelling.  Gastrointestinal: Negative.  Negative for abdominal pain, blood in stool, diarrhea, nausea and vomiting.  Genitourinary: Negative.  Negative for dysuria.  Musculoskeletal: Negative.  Negative for back pain.  Skin: Negative.   Neurological: Negative for dizziness, tremors, speech change, focal weakness, seizures and headaches.  Endo/Heme/Allergies: Negative.  Does not bruise/bleed easily.  Psychiatric/Behavioral: Negative.  Negative for depression, hallucinations and suicidal ideas.    Tolerating Diet:yes      DRUG ALLERGIES:  No Known Allergies  VITALS:  Blood pressure (!) 153/58, pulse 76, temperature 98.5 F (36.9 C), temperature source Oral, resp. rate 18, height 5' (1.524 m), weight 68.4 kg (150 lb 12.8 oz), SpO2 95 %.  PHYSICAL EXAMINATION:   Physical Exam  Constitutional: She is oriented to person, place, and time and well-developed, well-nourished, and in no distress. No distress.  HENT:  Head: Normocephalic.  Eyes: No scleral icterus.  Neck: Normal range of motion. Neck supple. No JVD present. No tracheal deviation present.  Cardiovascular: Normal rate and regular rhythm.  Exam reveals no gallop and no friction rub.   Murmur heard. Pulmonary/Chest: Effort normal and breath sounds normal. No respiratory distress. She has no wheezes. She has no rales. She  exhibits no tenderness.  Abdominal: Soft. Bowel sounds are normal. She exhibits no distension and no mass. There is no tenderness. There is no rebound and no guarding.  Musculoskeletal: Normal range of motion. She exhibits no edema.  Neurological: She is alert and oriented to person, place, and time.  Skin: Skin is warm. No rash noted. No erythema.  Psychiatric: Affect and judgment normal.      LABORATORY PANEL:   CBC  Recent Labs Lab 12/20/15 0540  WBC 11.8*  HGB 9.9*  HCT 29.6*  PLT 242   ------------------------------------------------------------------------------------------------------------------  Chemistries   Recent Labs Lab 12/19/15 0000 12/20/15 0540  NA 143 142  K 4.4 4.3  CL 109 120*  CO2 23 18*  GLUCOSE 85 111*  BUN 49* 38*  CREATININE 1.81* 1.12*  CALCIUM 9.5 9.1  AST 25  --   ALT 12*  --   ALKPHOS 102  --   BILITOT 0.6  --    ------------------------------------------------------------------------------------------------------------------  Cardiac Enzymes  Recent Labs Lab 12/19/15 0000  TROPONINI <0.03   ------------------------------------------------------------------------------------------------------------------  RADIOLOGY:  Dg Chest 2 View  Result Date: 12/19/2015 CLINICAL DATA:  Diarrhea, weakness EXAM: CHEST  2 VIEW COMPARISON:  11/25/2015 FINDINGS: Lungs are clear.  No pleural effusion or pneumothorax. Cardiomegaly.  Coronary stent.  Left subclavian pacemaker. Degenerative changes of the visualized thoracolumbar spine. IMPRESSION: No evidence of acute cardiopulmonary disease. Electronically Signed   By: Charline Bills M.D.   On: 12/19/2015 13:55    ASSESSMENT AND PLAN:   75 year old female with history of chronic diastolic heart failure on chronic kidney disease stage III who presents with generalized weakness and diarrhea.    1. C. difficile Diarrhea:  Patient presented with diarrhea. She was recently on antibiotics and  recently hospitalized for urinary tract infection. She is started on Flagyl for C. Difficile Diarrhea, as this is her first episode. Diarrhea has improved.  2. Urinary tract infection: She was recently treated for Escherichia coli urinary tract infection with Keflex. Urine culture was sensitive to cephalosporins. She had a urinary tract infection this time likely due to diarrhea as the initial source.  She was started on Rocephin and will be discharged on Ceftin.   3. Acute on chronic kidney disease stage III: This is due to dehydration. Her creatinine is now normalized after IV fluids.   4. Essential hypertension: Continue metoprolol,isosorbide and hydralazine.  5. Hyperlipidemia: Continue pravastatin and gemfibrozil.  6. Depression: Continue Paxil. 7. Diastolic heart failure with preserved ejection fraction: CHF vest was utilized during this hospital stay. It did not appear that patient had high risk for readmission for CHF and to the reading. Chest x-ray showed no evidence of CHF.   8. Generalized weakness: Patient's daughter was concerned patient may have had TIA due to her generalized weakness. She had no focal neurological deficits. CT head initially showed no acute changes. Her generalized weakness was due to diarrhea and dehydration. Physical therapy did recommend home with home health      Management plans discussed with the patient and she is in agreement.  CODE STATUS: full  TOTAL TIME TAKING CARE OF THIS PATIENT: 30 minutes.     POSSIBLE D/C today, DEPENDING ON CLINICAL CONDITION.   Sally Curry M.D on 12/21/2015 at 9:06 AM  Between 7am to 6pm - Pager - [phone] After 6pm go to [email or web address] - password [email or web address]  [facility]  Office  [phone]  CC: Primary care physician; Leotis Shames, MD  Note: This dictation was prepared with Dragon dictation along with smaller phrase technology. Any transcriptional errors that result from  this process are unintentional.

## 2016-01-12 LAB — CULTURE, BLOOD (ROUTINE X 2)
CULTURE: NO GROWTH
Culture: NO GROWTH

## 2016-02-10 ENCOUNTER — Ambulatory Visit: Payer: Medicare Other | Admitting: Family

## 2016-03-05 ENCOUNTER — Encounter: Payer: Self-pay | Admitting: Emergency Medicine

## 2016-03-05 ENCOUNTER — Emergency Department: Payer: Medicare Other

## 2016-03-05 ENCOUNTER — Inpatient Hospital Stay
Admission: EM | Admit: 2016-03-05 | Discharge: 2016-03-10 | DRG: 604 | Disposition: A | Discharge status: Skilled Nursing Facility | Payer: Medicare Other | Attending: Internal Medicine | Admitting: Internal Medicine

## 2016-03-05 DIAGNOSIS — Z825 Family history of asthma and other chronic lower respiratory diseases: Secondary | ICD-10-CM

## 2016-03-05 DIAGNOSIS — H409 Unspecified glaucoma: Secondary | ICD-10-CM | POA: Diagnosis present

## 2016-03-05 DIAGNOSIS — X58XXXA Exposure to other specified factors, initial encounter: Secondary | ICD-10-CM | POA: Diagnosis present

## 2016-03-05 DIAGNOSIS — N179 Acute kidney failure, unspecified: Secondary | ICD-10-CM | POA: Diagnosis present

## 2016-03-05 DIAGNOSIS — Z950 Presence of cardiac pacemaker: Secondary | ICD-10-CM

## 2016-03-05 DIAGNOSIS — I2510 Atherosclerotic heart disease of native coronary artery without angina pectoris: Secondary | ICD-10-CM | POA: Diagnosis present

## 2016-03-05 DIAGNOSIS — I509 Heart failure, unspecified: Secondary | ICD-10-CM | POA: Diagnosis present

## 2016-03-05 DIAGNOSIS — I130 Hypertensive heart and chronic kidney disease with heart failure and stage 1 through stage 4 chronic kidney disease, or unspecified chronic kidney disease: Secondary | ICD-10-CM | POA: Diagnosis present

## 2016-03-05 DIAGNOSIS — I739 Peripheral vascular disease, unspecified: Secondary | ICD-10-CM | POA: Diagnosis present

## 2016-03-05 DIAGNOSIS — M50223 Other cervical disc displacement at C6-C7 level: Secondary | ICD-10-CM | POA: Diagnosis present

## 2016-03-05 DIAGNOSIS — F0390 Unspecified dementia without behavioral disturbance: Secondary | ICD-10-CM | POA: Diagnosis present

## 2016-03-05 DIAGNOSIS — Z1623 Resistance to quinolones and fluoroquinolones: Secondary | ICD-10-CM | POA: Diagnosis present

## 2016-03-05 DIAGNOSIS — F329 Major depressive disorder, single episode, unspecified: Secondary | ICD-10-CM | POA: Diagnosis present

## 2016-03-05 DIAGNOSIS — R627 Adult failure to thrive: Secondary | ICD-10-CM | POA: Diagnosis present

## 2016-03-05 DIAGNOSIS — N183 Chronic kidney disease, stage 3 (moderate): Secondary | ICD-10-CM | POA: Diagnosis present

## 2016-03-05 DIAGNOSIS — Z9181 History of falling: Secondary | ICD-10-CM

## 2016-03-05 DIAGNOSIS — K219 Gastro-esophageal reflux disease without esophagitis: Secondary | ICD-10-CM | POA: Diagnosis present

## 2016-03-05 DIAGNOSIS — Z8249 Family history of ischemic heart disease and other diseases of the circulatory system: Secondary | ICD-10-CM

## 2016-03-05 DIAGNOSIS — M50222 Other cervical disc displacement at C5-C6 level: Secondary | ICD-10-CM | POA: Diagnosis present

## 2016-03-05 DIAGNOSIS — Z7902 Long term (current) use of antithrombotics/antiplatelets: Secondary | ICD-10-CM | POA: Diagnosis not present

## 2016-03-05 DIAGNOSIS — E860 Dehydration: Secondary | ICD-10-CM | POA: Diagnosis present

## 2016-03-05 DIAGNOSIS — E785 Hyperlipidemia, unspecified: Secondary | ICD-10-CM | POA: Diagnosis present

## 2016-03-05 DIAGNOSIS — S8001XA Contusion of right knee, initial encounter: Secondary | ICD-10-CM

## 2016-03-05 DIAGNOSIS — Z79899 Other long term (current) drug therapy: Secondary | ICD-10-CM | POA: Diagnosis not present

## 2016-03-05 DIAGNOSIS — N390 Urinary tract infection, site not specified: Secondary | ICD-10-CM | POA: Diagnosis present

## 2016-03-05 DIAGNOSIS — R2681 Unsteadiness on feet: Secondary | ICD-10-CM

## 2016-03-05 DIAGNOSIS — Z7982 Long term (current) use of aspirin: Secondary | ICD-10-CM

## 2016-03-05 DIAGNOSIS — Y92002 Bathroom of unspecified non-institutional (private) residence single-family (private) house as the place of occurrence of the external cause: Secondary | ICD-10-CM

## 2016-03-05 DIAGNOSIS — M109 Gout, unspecified: Secondary | ICD-10-CM | POA: Diagnosis present

## 2016-03-05 DIAGNOSIS — B9620 Unspecified Escherichia coli [E. coli] as the cause of diseases classified elsewhere: Secondary | ICD-10-CM | POA: Diagnosis present

## 2016-03-05 DIAGNOSIS — G9341 Metabolic encephalopathy: Secondary | ICD-10-CM | POA: Diagnosis present

## 2016-03-05 DIAGNOSIS — M6281 Muscle weakness (generalized): Secondary | ICD-10-CM

## 2016-03-05 DIAGNOSIS — W19XXXA Unspecified fall, initial encounter: Secondary | ICD-10-CM | POA: Diagnosis present

## 2016-03-05 DIAGNOSIS — I69351 Hemiplegia and hemiparesis following cerebral infarction affecting right dominant side: Secondary | ICD-10-CM

## 2016-03-05 DIAGNOSIS — S51011A Laceration without foreign body of right elbow, initial encounter: Secondary | ICD-10-CM

## 2016-03-05 DIAGNOSIS — Z955 Presence of coronary angioplasty implant and graft: Secondary | ICD-10-CM

## 2016-03-05 DIAGNOSIS — F419 Anxiety disorder, unspecified: Secondary | ICD-10-CM | POA: Diagnosis present

## 2016-03-05 DIAGNOSIS — N189 Chronic kidney disease, unspecified: Secondary | ICD-10-CM | POA: Diagnosis present

## 2016-03-05 DIAGNOSIS — S0003XA Contusion of scalp, initial encounter: Secondary | ICD-10-CM | POA: Diagnosis present

## 2016-03-05 DIAGNOSIS — A0471 Enterocolitis due to Clostridium difficile, recurrent: Secondary | ICD-10-CM | POA: Diagnosis present

## 2016-03-05 DIAGNOSIS — M50321 Other cervical disc degeneration at C4-C5 level: Secondary | ICD-10-CM | POA: Diagnosis present

## 2016-03-05 DIAGNOSIS — R4182 Altered mental status, unspecified: Secondary | ICD-10-CM | POA: Diagnosis present

## 2016-03-05 HISTORY — DX: Unspecified dementia, unspecified severity, without behavioral disturbance, psychotic disturbance, mood disturbance, and anxiety: F03.90

## 2016-03-05 LAB — COMPREHENSIVE METABOLIC PANEL
ALBUMIN: 3.9 g/dL (ref 3.5–5.0)
ALT: 11 U/L — ABNORMAL LOW (ref 14–54)
ANION GAP: 10 (ref 5–15)
AST: 26 U/L (ref 15–41)
Alkaline Phosphatase: 91 U/L (ref 38–126)
BILIRUBIN TOTAL: 1 mg/dL (ref 0.3–1.2)
BUN: 39 mg/dL — ABNORMAL HIGH (ref 6–20)
CHLORIDE: 111 mmol/L (ref 101–111)
CO2: 19 mmol/L — ABNORMAL LOW (ref 22–32)
Calcium: 9.3 mg/dL (ref 8.9–10.3)
Creatinine, Ser: 1.24 mg/dL — ABNORMAL HIGH (ref 0.44–1.00)
GFR calc Af Amer: 49 mL/min — ABNORMAL LOW (ref >60)
GFR, EST NON AFRICAN AMERICAN: 42 mL/min — AB (ref >60)
Glucose, Bld: 127 mg/dL — ABNORMAL HIGH (ref 65–99)
POTASSIUM: 3.9 mmol/L (ref 3.5–5.1)
Sodium: 140 mmol/L (ref 135–145)
TOTAL PROTEIN: 6.9 g/dL (ref 6.5–8.1)

## 2016-03-05 LAB — URINALYSIS COMPLETE WITH MICROSCOPIC (ARMC ONLY)
BILIRUBIN URINE: NEGATIVE
GLUCOSE, UA: NEGATIVE mg/dL
KETONES UR: NEGATIVE mg/dL
NITRITE: POSITIVE — AB
Protein, ur: 100 mg/dL — AB
SPECIFIC GRAVITY, URINE: 1.011 (ref 1.005–1.030)
pH: 6 (ref 5.0–8.0)

## 2016-03-05 LAB — CBC
HEMATOCRIT: 36.2 % (ref 35.0–47.0)
Hemoglobin: 12.3 g/dL (ref 12.0–16.0)
MCH: 32 pg (ref 26.0–34.0)
MCHC: 33.9 g/dL (ref 32.0–36.0)
MCV: 94.4 fL (ref 80.0–100.0)
PLATELETS: 200 10*3/uL (ref 150–440)
RBC: 3.83 MIL/uL (ref 3.80–5.20)
RDW: 14 % (ref 11.5–14.5)
WBC: 11.2 10*3/uL — AB (ref 3.6–11.0)

## 2016-03-05 LAB — TROPONIN I

## 2016-03-05 LAB — CK: Total CK: 126 U/L (ref 38–234)

## 2016-03-05 LAB — MAGNESIUM: MAGNESIUM: 1.9 mg/dL (ref 1.7–2.4)

## 2016-03-05 LAB — PHOSPHORUS: PHOSPHORUS: 2.4 mg/dL — AB (ref 2.5–4.6)

## 2016-03-05 MED ORDER — HYDRALAZINE HCL 50 MG PO TABS
100.0000 mg | ORAL_TABLET | ORAL | Status: AC
  Administered 2016-03-05: 100 mg via ORAL
  Filled 2016-03-05: qty 2

## 2016-03-05 MED ORDER — ACETAMINOPHEN 325 MG PO TABS
650.0000 mg | ORAL_TABLET | Freq: Four times a day (QID) | ORAL | Status: DC | PRN
  Administered 2016-03-06: 650 mg via ORAL
  Filled 2016-03-05: qty 2

## 2016-03-05 MED ORDER — FENTANYL CITRATE (PF) 100 MCG/2ML IJ SOLN
12.5000 ug | INTRAMUSCULAR | Status: AC
  Administered 2016-03-05: 12.5 ug via INTRAVENOUS
  Filled 2016-03-05: qty 2

## 2016-03-05 MED ORDER — SODIUM CHLORIDE 0.9 % IV SOLN
INTRAVENOUS | Status: DC
  Administered 2016-03-05: 22:00:00 via INTRAVENOUS

## 2016-03-05 MED ORDER — CIPROFLOXACIN HCL 500 MG PO TABS
500.0000 mg | ORAL_TABLET | Freq: Two times a day (BID) | ORAL | Status: DC
Start: 2016-03-05 — End: 2016-03-08
  Administered 2016-03-05 – 2016-03-08 (×6): 500 mg via ORAL
  Filled 2016-03-05 (×6): qty 1

## 2016-03-05 MED ORDER — NYSTATIN 100000 UNIT/GM EX CREA
1.0000 "application " | TOPICAL_CREAM | Freq: Two times a day (BID) | CUTANEOUS | Status: DC
  Administered 2016-03-06 – 2016-03-10 (×10): 1 via TOPICAL
  Filled 2016-03-05: qty 15

## 2016-03-05 MED ORDER — GEMFIBROZIL 600 MG PO TABS
600.0000 mg | ORAL_TABLET | Freq: Two times a day (BID) | ORAL | Status: DC
  Administered 2016-03-06 – 2016-03-10 (×9): 600 mg via ORAL
  Filled 2016-03-05 (×9): qty 1

## 2016-03-05 MED ORDER — ASPIRIN EC 81 MG PO TBEC
81.0000 mg | DELAYED_RELEASE_TABLET | Freq: Every day | ORAL | Status: DC
  Administered 2016-03-05 – 2016-03-10 (×6): 81 mg via ORAL
  Filled 2016-03-05 (×5): qty 1

## 2016-03-05 MED ORDER — SENNOSIDES-DOCUSATE SODIUM 8.6-50 MG PO TABS
1.0000 | ORAL_TABLET | Freq: Every evening | ORAL | Status: DC | PRN
Start: 2016-03-05 — End: 2016-03-10

## 2016-03-05 MED ORDER — CLOPIDOGREL BISULFATE 75 MG PO TABS
75.0000 mg | ORAL_TABLET | Freq: Every day | ORAL | Status: DC
  Administered 2016-03-05 – 2016-03-10 (×6): 75 mg via ORAL
  Filled 2016-03-05 (×6): qty 1

## 2016-03-05 MED ORDER — ENOXAPARIN SODIUM 40 MG/0.4ML ~~LOC~~ SOLN
40.0000 mg | SUBCUTANEOUS | Status: DC
  Administered 2016-03-05 – 2016-03-09 (×5): 40 mg via SUBCUTANEOUS
  Filled 2016-03-05 (×5): qty 0.4

## 2016-03-05 MED ORDER — ZOLPIDEM TARTRATE 5 MG PO TABS
5.0000 mg | ORAL_TABLET | Freq: Every evening | ORAL | Status: DC | PRN
  Administered 2016-03-05 – 2016-03-09 (×4): 5 mg via ORAL
  Filled 2016-03-05 (×4): qty 1

## 2016-03-05 MED ORDER — FOSFOMYCIN TROMETHAMINE 3 G PO PACK
3.0000 g | PACK | ORAL | Status: AC
  Administered 2016-03-05: 3 g via ORAL
  Filled 2016-03-05: qty 3

## 2016-03-05 MED ORDER — ALLOPURINOL 100 MG PO TABS
100.0000 mg | ORAL_TABLET | Freq: Every day | ORAL | Status: DC
  Administered 2016-03-05 – 2016-03-10 (×6): 100 mg via ORAL
  Filled 2016-03-05 (×6): qty 1

## 2016-03-05 MED ORDER — PANTOPRAZOLE SODIUM 40 MG PO TBEC
40.0000 mg | DELAYED_RELEASE_TABLET | Freq: Every day | ORAL | Status: DC
  Administered 2016-03-06: 11:00:00 40 mg via ORAL
  Filled 2016-03-05: qty 1

## 2016-03-05 MED ORDER — ONDANSETRON HCL 4 MG PO TABS: 4.0000 mg | ORAL_TABLET | Freq: Three times a day (TID) | ORAL | Status: DC | PRN

## 2016-03-05 MED ORDER — POTASSIUM CHLORIDE CRYS ER 20 MEQ PO TBCR
20.0000 meq | EXTENDED_RELEASE_TABLET | Freq: Two times a day (BID) | ORAL | Status: DC
  Administered 2016-03-05 – 2016-03-08 (×6): 20 meq via ORAL
  Filled 2016-03-05 (×6): qty 1

## 2016-03-05 MED ORDER — ESCITALOPRAM OXALATE 10 MG PO TABS
10.0000 mg | ORAL_TABLET | Freq: Every day | ORAL | Status: DC
  Administered 2016-03-05 – 2016-03-10 (×6): 10 mg via ORAL
  Filled 2016-03-05 (×6): qty 1

## 2016-03-05 MED ORDER — TRAMADOL HCL 50 MG PO TABS: 50.0000 mg | ORAL_TABLET | Freq: Four times a day (QID) | ORAL | 0 refills | Status: DC | PRN

## 2016-03-05 MED ORDER — ACETAMINOPHEN 325 MG PO TABS
650.0000 mg | ORAL_TABLET | ORAL | Status: AC
  Administered 2016-03-05: 650 mg via ORAL
  Filled 2016-03-05: qty 2

## 2016-03-05 MED ORDER — BISACODYL 5 MG PO TBEC: 5.0000 mg | DELAYED_RELEASE_TABLET | Freq: Every day | ORAL | Status: DC | PRN

## 2016-03-05 MED ORDER — HYDROCODONE-ACETAMINOPHEN 5-325 MG PO TABS
1.0000 | ORAL_TABLET | ORAL | Status: DC | PRN
  Administered 2016-03-05 – 2016-03-08 (×3): 2 via ORAL
  Administered 2016-03-08: 09:00:00 1 via ORAL
  Administered 2016-03-09 – 2016-03-10 (×2): 2 via ORAL
  Filled 2016-03-05 (×4): qty 2
  Filled 2016-03-05: qty 1
  Filled 2016-03-05: qty 2

## 2016-03-05 MED ORDER — MAGNESIUM CITRATE PO SOLN
1.0000 | Freq: Once | ORAL | Status: DC | PRN
  Filled 2016-03-05: qty 296

## 2016-03-05 MED ORDER — PILOCARPINE HCL 1 % OP SOLN
1.0000 [drp] | Freq: Four times a day (QID) | OPHTHALMIC | Status: DC
  Administered 2016-03-05 – 2016-03-10 (×19): 1 [drp] via OPHTHALMIC
  Filled 2016-03-05: qty 15

## 2016-03-05 MED ORDER — ISOSORBIDE MONONITRATE ER 60 MG PO TB24
60.0000 mg | ORAL_TABLET | Freq: Once | ORAL | Status: AC
  Administered 2016-03-05: 60 mg via ORAL
  Filled 2016-03-05: qty 1

## 2016-03-05 MED ORDER — ACETAMINOPHEN 650 MG RE SUPP: 650.0000 mg | Freq: Four times a day (QID) | RECTAL | Status: DC | PRN

## 2016-03-05 MED ORDER — METOPROLOL SUCCINATE ER 50 MG PO TB24
50.0000 mg | ORAL_TABLET | Freq: Every day | ORAL | Status: DC
  Administered 2016-03-05 – 2016-03-10 (×6): 50 mg via ORAL
  Filled 2016-03-05 (×6): qty 1

## 2016-03-05 MED ORDER — FOLIC ACID 1 MG PO TABS
1000.0000 ug | ORAL_TABLET | Freq: Every day | ORAL | Status: DC
  Administered 2016-03-05 – 2016-03-10 (×6): 1 mg via ORAL
  Filled 2016-03-05 (×6): qty 1

## 2016-03-05 MED ORDER — AMLODIPINE BESYLATE 5 MG PO TABS
2.5000 mg | ORAL_TABLET | Freq: Every day | ORAL | Status: DC
  Administered 2016-03-05 – 2016-03-10 (×6): 2.5 mg via ORAL
  Filled 2016-03-05 (×6): qty 1

## 2016-03-05 MED ORDER — PRAVASTATIN SODIUM 40 MG PO TABS
40.0000 mg | ORAL_TABLET | Freq: Every day | ORAL | Status: DC
  Administered 2016-03-06 – 2016-03-09 (×4): 40 mg via ORAL
  Filled 2016-03-05 (×4): qty 1

## 2016-03-05 MED ORDER — VITAMIN D 1000 UNITS PO TABS
1000.0000 [IU] | ORAL_TABLET | Freq: Every day | ORAL | Status: DC
  Administered 2016-03-05 – 2016-03-10 (×6): 1000 [IU] via ORAL
  Filled 2016-03-05 (×6): qty 1

## 2016-03-05 MED ORDER — ADULT MULTIVITAMIN W/MINERALS CH
1.0000 | ORAL_TABLET | Freq: Every day | ORAL | Status: DC
  Administered 2016-03-06 – 2016-03-10 (×5): 1 via ORAL
  Filled 2016-03-05 (×5): qty 1

## 2016-03-05 MED ORDER — FUROSEMIDE 20 MG PO TABS
20.0000 mg | ORAL_TABLET | Freq: Every day | ORAL | Status: DC
  Administered 2016-03-05 – 2016-03-09 (×5): 20 mg via ORAL
  Filled 2016-03-05 (×5): qty 1

## 2016-03-05 NOTE — ED Notes (Signed)
Patient given Ice pop

## 2016-03-05 NOTE — ED Notes (Signed)
Patient able to ambulate in the room with walker per her baseline

## 2016-03-05 NOTE — ED Notes (Signed)
Patient assisted with walker to the bathroom. Patient stated that she was unable to wipe herself and needed assistance wiping

## 2016-03-05 NOTE — ED Notes (Signed)
Patient brief changed and arm wrapped

## 2016-03-05 NOTE — H&P (Addendum)
[facility] - [place] @ [facility] Admission History and Physical Sally Curry, D.O.  ---------------------------------------------------------------------------------------------------------------------   PATIENT NAME: Sally Curry MR#: [id number] DATE OF BIRTH: Aug 19, 1940 DATE OF ADMISSION: 03/05/2016 PRIMARY CARE PHYSICIAN: Leotis Shames, MD  REQUESTING/REFERRING PHYSICIAN: ED Dr. Huel Cote  CHIEF COMPLAINT: Chief Complaint  Patient presents with  . Fall  . Head Injury    HISTORY OF PRESENT ILLNESS: Sally Curry is a 75 y.o. female with a known history of CAD, CHF, CKD, CVA, HTN presents to the emergency department with daughter who reports that she found her mother on the floor this morning at 9:30am.  Patient was last seen well at 12:30am.  Patient's daughter reports that she has not been herself for the last 24 hours. Decreased appetite and by mouth intake, increased lethargy. Patient states that she does not recall the events surrounding the fall overnight. She does recall that it was dark when she went into the bathroom does not recall anything after that. When her daughter found her she was lying on her right hip at the side of the toilet bowl complaining of right chest arm and back pain. Daughter states that she is still not back to her baseline in terms of mental status. Patient is normally functionally independent although she lives with her daughter.  Patient's only complaint at this time is right sided chest pain at the site of the trauma. She states that "I just don't feel well." She reports that she did not have dinner last night her breakfast this morning.  Otherwise there has been no change in status. Patient has been taking medication as prescribed and there has been no recent change in medication or diet.  There has been no recent illness, travel or sick contacts.    Patient denies fevers/chills, dizziness, chest pain, shortness of breath, N/V/C/D, abdominal pain,  dysuria/frequency, changes in mental status.    PAST MEDICAL HISTORY: Past Medical History:  Diagnosis Date  . Anemia   . Anxiety   . CAD (coronary artery disease)   . CHF (congestive heart failure) (HCC)   . Chronic kidney disease   . Dementia   . Depression   . Glaucoma   . Gout   . Hypertension   . Presence of permanent cardiac pacemaker   . PVD (peripheral vascular disease) (HCC)   . Renal insufficiency   . Stroke [facility])       PAST SURGICAL HISTORY: Past Surgical History:  Procedure Laterality Date  . ABDOMINAL SURGERY    . APPENDECTOMY    . CAROTID ENDARTERECTOMY    . CHOLECYSTECTOMY    . CORONARY ANGIOPLASTY WITH STENT PLACEMENT    . PACEMAKER INSERTION    . PERCUTANEOUS PLACEMENT INTRAVASCULAR STENT CERVICAL CAROTID ARTERY        SOCIAL HISTORY: Social History  Substance Use Topics  . Smoking status: Never Smoker  . Smokeless tobacco: Never Used  . Alcohol use No      FAMILY HISTORY: Family History  Problem Relation Age of Onset  . Hypertension Mother   . Parkinson's disease Mother   . Emphysema Father   . Diabetes Sister      MEDICATIONS AT HOME: Prior to Admission medications   Medication Sig Start Date End Date Taking? Authorizing Provider  allopurinol (ZYLOPRIM) 100 MG tablet Take 100 mg by mouth daily.   Yes Historical Provider, MD  amLODipine (NORVASC) 2.5 MG tablet Take 1 tablet (2.5 mg total) by mouth daily. 03/19/15  Yes Leotis Shames, MD  aspirin EC  81 MG tablet Take 81 mg by mouth daily.   Yes Historical Provider, MD  cholecalciferol (VITAMIN D) 1000 units tablet Take 1 tablet (1,000 Units total) by mouth daily. 12/21/15  Yes Adrian SaranSital Mody, MD  clopidogrel (PLAVIX) 75 MG tablet Take 75 mg by mouth daily.   Yes Historical Provider, MD  escitalopram (LEXAPRO) 10 MG tablet Take 10 mg by mouth daily.   Yes Historical Provider, MD  folic acid (FOLVITE) 1 MG tablet Take 1 tablet (1 mg total) by mouth daily. 12/21/15  Yes Adrian SaranSital Mody, MD   furosemide (LASIX) 20 MG tablet Take 20 mg by mouth daily. 10/18/15  Yes Historical Provider, MD  gemfibrozil (LOPID) 600 MG tablet Take 600 mg by mouth 2 (two) times daily before a meal.   Yes Historical Provider, MD  hydrALAZINE (APRESOLINE) 100 MG tablet Take 100 mg by mouth 3 (three) times daily. 09/15/15  Yes Historical Provider, MD  isosorbide mononitrate (IMDUR) 60 MG 24 hr tablet Take 60 mg by mouth daily. 10/18/15  Yes Historical Provider, MD  lovastatin (MEVACOR) 40 MG tablet Take 40 mg by mouth every evening. 10/18/15  Yes Historical Provider, MD  metoprolol succinate (TOPROL-XL) 50 MG 24 hr tablet Take 50 mg by mouth daily. 09/15/15  Yes Historical Provider, MD  Multiple Vitamin (MULTIVITAMIN WITH MINERALS) TABS tablet Take 1 tablet by mouth daily. 12/21/15  Yes Adrian SaranSital Mody, MD  omeprazole (PRILOSEC) 40 MG capsule Take 40 mg by mouth daily.   Yes Historical Provider, MD  ondansetron (ZOFRAN) 4 MG tablet Take 4 mg by mouth every 8 (eight) hours as needed for nausea or vomiting.   Yes Historical Provider, MD  pilocarpine (PILOCAR) 1 % ophthalmic solution Place 1 drop into both eyes 4 (four) times daily.   Yes Historical Provider, MD  potassium chloride SA (K-DUR,KLOR-CON) 20 MEQ tablet Take 20 mEq by mouth 2 (two) times daily. 10/18/15  Yes Historical Provider, MD  nystatin cream (MYCOSTATIN) Apply 1 application topically 2 (two) times daily as needed for rash. 09/15/15   Historical Provider, MD  traMADol (ULTRAM) 50 MG tablet Take 1 tablet (50 mg total) by mouth every 6 (six) hours as needed. 03/05/16   Sharyn CreamerMark Quale, MD      DRUG ALLERGIES: No Known Allergies   REVIEW OF SYSTEMS: CONSTITUTIONAL: Positive weakness, negative fever, chills, weight gain/loss, headache EYES: No blurry or double vision. ENT: No tinnitus, postnasal drip, redness or soreness of the oropharynx. RESPIRATORY: No dyspnea, cough, wheeze, hemoptysis. CARDIOVASCULAR: No chest pain, orthopnea, palpitations,  syncope. GASTROINTESTINAL: No nausea, vomiting, constipation, diarrhea, abdominal pain. No hematemesis, melena or hematochezia. GENITOURINARY: No dysuria, frequency, hematuria. ENDOCRINE: No polyuria or nocturia. No heat or cold intolerance. HEMATOLOGY: No anemia, bruising, bleeding. INTEGUMENTARY: No rashes, ulcers, lesions. MUSCULOSKELETAL: Positive left chest pain, arthritis, swelling, gout. NEUROLOGIC: No numbness, tingling, weakness or ataxia. No seizure-type activity. PSYCHIATRIC: No anxiety, depression, insomnia.  PHYSICAL EXAMINATION: VITAL SIGNS: Blood pressure (!) 139/51, pulse (!) 59, temperature 98.2 F (36.8 C), temperature source Oral, resp. rate 16, height 5' (1.524 m), weight 72.6 kg (160 lb 0.9 oz), SpO2 95 %.  GENERAL: 75 y.o.-year-old white female patient, well-developed, well-nourished lying in the bed in no acute distress.  Patient is lethargic but arousable, pleasant and cooperative HEENT: Head with mild hematoma to right temporal area. Pupils equal, round, reactive to light and accommodation. No scleral icterus. Extraocular muscles intact. Oropharynx is clear. Mucus membranes moist. NECK: Supple, full range of motion. No JVD, no bruit heard. No cervical  lymphadenopathy. CHEST: Normal breath sounds bilaterally. No wheezing, rales, rhonchi or crackles. No use of accessory muscles of respiration. Positive reproducible chest wall over to the right anterior chest CARDIOVASCULAR: S1, S2 normal. No murmurs, rubs, or gallops appreciated. Cap refill <2 seconds. ABDOMEN: Soft, nontender, nondistended. No rebound, guarding, rigidity. Normoactive bowel sounds present in all four quadrants. No organomegaly or mass. EXTREMITIES: Full range of motion. No pedal edema, cyanosis, or clubbing. Positive superficial hematoma of the right anterior leg NEUROLOGIC: Patient cannot comply with full exam secondary to lethargy / AMS PSYCHIATRIC: The patient is alert and oriented x 1. Lethargic but  arousable.  SKIN: Warm, dry, and intact without obvious rash, lesion, or ulcer.  LABORATORY PANEL:  CBC  Recent Labs Lab 03/05/16 1017  WBC 11.2*  HGB 12.3  HCT 36.2  PLT 200   ----------------------------------------------------------------------------------------------------------------- Chemistries  Recent Labs Lab 03/05/16 1017  NA 140  K 3.9  CL 111  CO2 19*  GLUCOSE 127*  BUN 39*  CREATININE 1.24*  CALCIUM 9.3  AST 26  ALT 11*  ALKPHOS 91  BILITOT 1.0   ------------------------------------------------------------------------------------------------------------------ Cardiac Enzymes  Recent Labs Lab 03/05/16 1017  TROPONINI <0.03   ------------------------------------------------------------------------------------------------------------------  RADIOLOGY: Dg Chest 2 View  Result Date: 03/05/2016 CLINICAL DATA:  75 year old female with a history of fall. Chest pain EXAM: CHEST  2 VIEW COMPARISON:  12/19/2015 FINDINGS: Cardiomediastinal silhouette unchanged in size and contour. Changes of prior coronary stenting. Cardiac pacing device on the left chest wall with 2 leads in place. Calcifications of the aortic arch. No pneumothorax or pleural effusion. Coarsened interstitial markings similar to prior study. No confluent airspace disease. Degenerative changes of the spine without fracture identified. Posttraumatic changes of the right humeral head again noted. Degenerative changes of the shoulders. IMPRESSION: Chronic lung changes without evidence of superimposed acute cardiopulmonary disease. Aortic atherosclerosis. Evidence of prior coronary stenting. Unchanged cardiac pacing device on the left chest wall. Signed, Yvone Neu. Loreta Ave, DO [facility] Electronically Signed   By: Gilmer Mor D.O.   On: 03/05/2016 13:14   Dg Pelvis 1-2 Views  Result Date: 03/05/2016 CLINICAL DATA:  Patient [facility] in [facility]  from home for fall sometime during the night. Patient has hematoma to the right side of the head, patient has right elbow skin tear, and has bruising on the right leg. Pt states severe right shoulder pain and limited rom and pain to RLE. 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 EXAM: PELVIS - 1-2 VIEW COMPARISON:  CT, 11/25/2015 FINDINGS: No fracture.  No bone lesion. Hip joints, SI joints and symphysis pubis are normally aligned. Bones are diffusely demineralized. There are multiple surgical vascular clips in the pelvis. Soft tissues are otherwise unremarkable. IMPRESSION: No fracture or dislocation. Electronically Signed   By: Amie Portland M.D.   On: 03/05/2016 11:46   Dg Shoulder Right  Result Date: 03/05/2016 CLINICAL DATA:  Patient brought in by [facility] from home for fall sometime during the night. Patient has hematoma to the right side of the head, patient has right elbow skin tear, and has bruising on the right leg. Pt states severe right shoulder pain and limited rom and pain to RLE. 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 EXAM: RIGHT SHOULDER - 2+ VIEW COMPARISON:  Chest radiograph, 11/25/2015 FINDINGS: No acute fracture. Old, healed fracture of the proximal right humerus. Glenohumeral AC joints are normally aligned. Bones are diffusely demineralized. Soft tissues are  unremarkable. IMPRESSION: No acute fracture or dislocation. Electronically Signed   By: Amie Portland M.D.   On: 03/05/2016 11:43   Dg Tibia/fibula Right  Result Date: 03/05/2016 CLINICAL DATA:  Patient brought in by [facility] from home for fall sometime during the night. Patient has hematoma to the right side of the head, patient has right elbow skin tear, and has bruising on the right leg. Pt states severe right shoulder pain and limited rom and pain to RLE. 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 EXAM: RIGHT TIBIA AND FIBULA - 2 VIEW COMPARISON:   None. FINDINGS: No fracture.  No bone lesion. Knee and ankle joints are normally aligned. Bones are demineralized. Soft tissues are unremarkable. IMPRESSION: No fracture or dislocation. Electronically Signed   By: Amie Portland M.D.   On: 03/05/2016 11:45   Ct Head Wo Contrast  Result Date: 03/05/2016 CLINICAL DATA:  Fall during the night. Right head hematoma. Patient is on blood thinners. History of strokes with residual right weakness. EXAM: CT HEAD WITHOUT CONTRAST CT CERVICAL SPINE WITHOUT CONTRAST TECHNIQUE: Multidetector CT imaging of the head and cervical spine was performed following the standard protocol without intravenous contrast. Multiplanar CT image reconstructions of the cervical spine were also generated. COMPARISON:  12/19/2015 FINDINGS: CT HEAD FINDINGS Brain: The ventricles normal configuration. There is mild ex vacuo dilation of the left lateral ventricle due to an old infarct. Overall ventricular sizes are normal for age. There are no parenchymal masses or mass effect. There is an old left frontal infarct. There is no evidence of a recent infarct. There are no extra-axial masses or abnormal fluid collections. No intracranial hemorrhage. Vascular: No hyperdense vessel or unexpected calcification. Skull: Normal. Negative for fracture or focal lesion. Sinuses/Orbits: Globes and orbits are unremarkable. Visualized sinuses and mastoid air cells are clear. Other: Right frontal scalp hematoma. CT CERVICAL SPINE FINDINGS Alignment: Straightening of the normal cervical lordosis. No spondylolisthesis. Skull base and vertebrae: No acute fracture. No primary bone lesion or focal pathologic process. Soft tissues and spinal canal: No prevertebral fluid or swelling. No visible canal hematoma. Disc levels: Moderate loss of disc height at C5-C6 with endplate osteophytes and spondylotic disc bulging. Mild loss disc height at C6-C7 with mild disc bulging. Facet degenerative change noted on the right at  C3-C4-C4-C5. No significant stenosis. Upper chest: Unremarkable. Other: None IMPRESSION: HEAD CT:  No acute intracranial abnormalities.  No skull fracture. CERVICAL CT:  No fracture or acute finding. Electronically Signed   By: Amie Portland M.D.   On: 03/05/2016 11:02   Ct Cervical Spine Wo Contrast  Result Date: 03/05/2016 CLINICAL DATA:  Fall during the night. Right head hematoma. Patient is on blood thinners. History of strokes with residual right weakness. EXAM: CT HEAD WITHOUT CONTRAST CT CERVICAL SPINE WITHOUT CONTRAST TECHNIQUE: Multidetector CT imaging of the head and cervical spine was performed following the standard protocol without intravenous contrast. Multiplanar CT image reconstructions of the cervical spine were also generated. COMPARISON:  12/19/2015 FINDINGS: CT HEAD FINDINGS Brain: The ventricles normal configuration. There is mild ex vacuo dilation of the left lateral ventricle due to an old infarct. Overall ventricular sizes are normal for age. There are no parenchymal masses or mass effect. There is an old left frontal infarct. There is no evidence of a recent infarct. There are no extra-axial masses or abnormal fluid collections. No intracranial hemorrhage. Vascular: No hyperdense vessel or unexpected calcification. Skull: Normal. Negative for fracture or focal lesion. Sinuses/Orbits: Globes  and orbits are unremarkable. Visualized sinuses and mastoid air cells are clear. Other: Right frontal scalp hematoma. CT CERVICAL SPINE FINDINGS Alignment: Straightening of the normal cervical lordosis. No spondylolisthesis. Skull base and vertebrae: No acute fracture. No primary bone lesion or focal pathologic process. Soft tissues and spinal canal: No prevertebral fluid or swelling. No visible canal hematoma. Disc levels: Moderate loss of disc height at C5-C6 with endplate osteophytes and spondylotic disc bulging. Mild loss disc height at C6-C7 with mild disc bulging. Facet degenerative change noted  on the right at C3-C4-C4-C5. No significant stenosis. Upper chest: Unremarkable. Other: None IMPRESSION: HEAD CT:  No acute intracranial abnormalities.  No skull fracture. CERVICAL CT:  No fracture or acute finding. Electronically Signed   By: Amie Portland M.D.   On: 03/05/2016 11:02   Dg Humerus Right  Result Date: 03/05/2016 CLINICAL DATA:  Patient brought in by [facility] from home for fall sometime during the night. Patient has hematoma to the right side of the head, patient has right elbow skin tear, and has bruising on the right leg. Pt states severe right shoulder pain and limited rom and pain to RLE. 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 EXAM: RIGHT HUMERUS - 2+ VIEW COMPARISON:  None. FINDINGS: No acute fracture. , old, healed fracture of the proximal right humerus. Shoulder and elbow joints are normally aligned. Bones are extensively demineralized cracks and bones are diffusely demineralized. Soft tissues are unremarkable. IMPRESSION: No acute fracture or dislocation. Electronically Signed   By: Amie Portland M.D.   On: 03/05/2016 11:43   Dg Femur Min 2 Views Right  Result Date: 03/05/2016 CLINICAL DATA:  Patient brought in by [facility] from home for fall sometime during the night. Patient has hematoma to the right side of the head, patient has right elbow skin tear, and has bruising on the right leg. Pt states severe right shoulder pain and limited rom and pain to RLE. 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 EXAM: RIGHT FEMUR 2 VIEWS COMPARISON:  None. FINDINGS: No acute fracture.  No bone lesion. Knee and hip joints are normally aligned. There are degenerative changes of the knee joint. Bones are demineralized. Soft tissues are unremarkable. IMPRESSION: No fracture or dislocation. Electronically Signed   By: Amie Portland M.D.   On: 03/05/2016 11:45    EKG: Normal sinus rhythm at 60 bpm with normal axis and nonspecific ST-T wave changes.    IMPRESSION AND PLAN:  This is a 75 y.o. female with a history of CAD, CHF, CKD, CVA, HTN  now being admitted with: 1. Altered mental status likely secondary to urinary tract infection-admit to inpatient for IV fluid hydration, oral Cipro for UTI, follow up urine culture and check daily labs. 2. Failure to thrive-decreased by mouth intake over the past few days-we'll request physical therapy evaluation for consideration of subacute rehabilitation placement for reconditioning and improving functional capacity with respect to her activities of daily living. 3. History of hypertension-continue Norvasc, Lasix/potassium, hydralazine, Imdur, Toprol 4. History of hyperlipidemia-continue Lopid 5. History of coronary artery disease/CVA-continue aspirin and Plavix 6. History of depression-continue Lexapro 7. History of GERD-continue Prilosec 8. History of gout-continue allopurinol   Diet/Nutrition: Heart healthy Fluids: IV normal saline DVT Px: Lovenox, SCDs and early ambulation Code Status: Full. Confirmed in the presence of the daughter the patient would like all resuscitative measures.  All the records are reviewed and case discussed with ED provider. Management plans discussed with the patient  and/or family who express understanding and agree with plan of care.   TOTAL TIME TAKING CARE OF THIS PATIENT: 60 minutes.   Emina Ribaudo D.O. on 03/05/2016 at 8:23 PM Between 7am to 6pm - Pager - [phone] After 6pm go to [email or web address] - Social research officer, government [facility] [place] [facility] Office [phone] CC: Primary care physician; Leotis Shames, MD     Note: This dictation was prepared with Dragon dictation along with smaller phrase technology. Any transcriptional errors that result from this process are unintentional.

## 2016-03-05 NOTE — ED Provider Notes (Addendum)
[facility] Emergency Department Provider Note   ____________________________________________   First MD Initiated Contact with Patient 03/05/16 1001     (approximate)  I have reviewed the triage vital signs and the nursing notes.   HISTORY  Chief Complaint Fall and Head Injury    HPI Sally Curry is a 75 y.o. female history of known dementia, congestive heart failure, coronary disease, renal insufficiency and a previous stroke with right-sided weakness.  Per the report of EMS, the patient was found this morning in the bathroom having suspected of fallen last night. She has a history of falls. She does take Plavix, and was noted to instruct her etc. head, having pain over her right shoulder and right mid to lower leg. She also noted to have a skin tear over the right elbow which is evidently known and an issue with chronic healing since.  The patient denies having a headache, no neck pain. She does however report that her right shoulder feels very sore" bruised" as well as having tenderness over her right knee.  She denies any chest pain nausea or vomiting. The patient knows that she is at the hospital, as well oriented to her name, but is not sure what year it is.   Past Medical History:  Diagnosis Date  . Anemia   . Anxiety   . CAD (coronary artery disease)   . CHF (congestive heart failure) (HCC)   . Chronic kidney disease   . Dementia   . Depression   . Glaucoma   . Gout   . Hypertension   . Presence of permanent cardiac pacemaker   . PVD (peripheral vascular disease) (HCC)   . Renal insufficiency   . Stroke [facility])     Patient Active Problem List   Diagnosis Date Noted  . Diarrhea 12/19/2015  . Malignant essential hypertension 11/25/2015  . Generalized weakness 11/25/2015  . Sepsis (HCC) 11/25/2015  . Leukocytosis 11/25/2015  . Nausea and vomiting 11/25/2015  . Acute on chronic renal failure (HCC) 11/25/2015  . Dehydration  11/25/2015  . History of CVA with residual deficit 10/04/2015  . CHF (congestive heart failure) (HCC) 09/22/2015  . UTI (lower urinary tract infection) 06/04/2015  . Duodenal ulcer with hemorrhage 01/20/2014  . CAD (coronary artery disease), native coronary artery 01/20/2014  . Essential hypertension, benign 01/20/2014  . Dyslipidemia 01/20/2014  . Hypokalemia 01/20/2014  . Glaucoma 01/20/2014  . Depression with anxiety 01/20/2014    Past Surgical History:  Procedure Laterality Date  . ABDOMINAL SURGERY    . APPENDECTOMY    . CAROTID ENDARTERECTOMY    . CHOLECYSTECTOMY    . CORONARY ANGIOPLASTY WITH STENT PLACEMENT    . PACEMAKER INSERTION    . PERCUTANEOUS PLACEMENT INTRAVASCULAR STENT CERVICAL CAROTID ARTERY      Prior to Admission medications   Medication Sig Start Date End Date Taking? Authorizing Provider  allopurinol (ZYLOPRIM) 100 MG tablet Take 100 mg by mouth daily.    Historical Provider, MD  amLODipine (NORVASC) 2.5 MG tablet Take 1 tablet (2.5 mg total) by mouth daily. 03/19/15   Leotis Shames, MD  aspirin EC 81 MG tablet Take 81 mg by mouth daily.    Historical Provider, MD  cefUROXime (CEFTIN) 500 MG tablet Take 1 tablet (500 mg total) by mouth 2 (two) times daily with a meal. 12/20/15   Adrian Saran, MD  cholecalciferol (VITAMIN D) 1000 units tablet Take 1 tablet (1,000 Units total) by mouth daily. 12/21/15   Sital  Mody, MD  clopidogrel (PLAVIX) 75 MG tablet Take 75 mg by mouth daily.    Historical Provider, MD  folic acid (FOLVITE) 1 MG tablet Take 1 tablet (1 mg total) by mouth daily. 12/21/15   Adrian Saran, MD  furosemide (LASIX) 20 MG tablet Take 20 mg by mouth daily. 10/18/15   Historical Provider, MD  gemfibrozil (LOPID) 600 MG tablet Take 600 mg by mouth 2 (two) times daily before a meal.    Historical Provider, MD  hydrALAZINE (APRESOLINE) 100 MG tablet Take 100 mg by mouth 3 (three) times daily. 09/15/15   Historical Provider, MD  Iron, Ferrous Sulfate, 142 (45 Fe)  MG TBCR Take 142 mg by mouth 2 (two) times daily. 12/21/15   Adrian Saran, MD  isosorbide mononitrate (IMDUR) 60 MG 24 hr tablet Take 60 mg by mouth daily. 10/18/15   Historical Provider, MD  lovastatin (MEVACOR) 40 MG tablet Take 40 mg by mouth every evening. 10/18/15   Historical Provider, MD  metoprolol succinate (TOPROL-XL) 50 MG 24 hr tablet Take 50 mg by mouth daily. 09/15/15   Historical Provider, MD  Multiple Vitamin (MULTIVITAMIN WITH MINERALS) TABS tablet Take 1 tablet by mouth daily. 12/21/15   Adrian Saran, MD  nystatin cream (MYCOSTATIN) Apply 1 application topically 2 (two) times daily as needed for rash. 09/15/15   Historical Provider, MD  omeprazole (PRILOSEC) 40 MG capsule Take 40 mg by mouth daily.    Historical Provider, MD  ondansetron (ZOFRAN) 4 MG tablet Take 4 mg by mouth every 8 (eight) hours as needed for nausea or vomiting.    Historical Provider, MD  PARoxetine (PAXIL) 10 MG tablet Take 10 mg by mouth daily. 12/15/15   Historical Provider, MD  pilocarpine (PILOCAR) 1 % ophthalmic solution Place 1 drop into both eyes 4 (four) times daily.    Historical Provider, MD  potassium chloride SA (K-DUR,KLOR-CON) 20 MEQ tablet Take 20 mEq by mouth 2 (two) times daily. 10/18/15   Historical Provider, MD  traMADol (ULTRAM) 50 MG tablet Take 1 tablet (50 mg total) by mouth every 6 (six) hours as needed. 03/05/16   Sharyn Creamer, MD    Allergies Review of patient's allergies indicates no known allergies.  Family History  Problem Relation Age of Onset  . Hypertension Mother   . Parkinson's disease Mother   . Emphysema Father   . Diabetes Sister     Social History Social History  Substance Use Topics  . Smoking status: Never Smoker  . Smokeless tobacco: Never Used  . Alcohol use No    Review of Systems - possibly somewhat limited due to dementia, but the patient is able to provide a review of systems Constitutional: No fever/chills Eyes: No visual changes. ENT: No sore  throat. Cardiovascular: Denies chest pain. Respiratory: Denies shortness of breath. Gastrointestinal: No abdominal pain.  No nausea, no vomiting.   Genitourinary: Negative for dysuria. Musculoskeletal: Negative for back pain. Skin: Negative for rash except for bruising over her right shoulder. Neurological: Negative for headaches or numbness. Patient tells me she has weakness in her right arm and right leg from a prior stroke, this is not new and unchanged. No weakness on the left side. No pain on the left side.  10-point ROS otherwise negative.  ____________________________________________   PHYSICAL EXAM:  VITAL SIGNS: ED Triage Vitals  Enc Vitals Group     BP 03/05/16 1001 (!) 216/60     Pulse Rate 03/05/16 1001 63     Resp 03/05/16  1001 18     Temp 03/05/16 1001 98.2 F (36.8 C)     Temp Source 03/05/16 1001 Oral     SpO2 03/05/16 1001 97 %     Weight 03/05/16 1001 160 lb 0.9 oz (72.6 kg)     Height 03/05/16 1001 5' (1.524 m)     Head Circumference --      Peak Flow --      Pain Score 03/05/16 1002 10     Pain Loc --      Pain Edu? --      Excl. in GC? --     Constitutional: Alert and orientedUp to date. Well appearing and in no acute distress. Eyes: Conjunctivae are normal. PERRL. EOMI. Head: Atraumatic. Nose: No congestion/rhinnorhea. Mouth/Throat: Mucous membranes are moist.  Oropharynx non-erythematous. Neck: No stridor.  No midline cervical tenderness. Cardiovascular: Normal rate, regular rhythm with an occasional irregularity. Grossly normal heart sounds.  Good peripheral circulation. Respiratory: Normal respiratory effort.  No retractions. Lungs CTAB. Gastrointestinal: Soft and nontender. No distention. No abdominal bruits. No CVA tenderness. Musculoskeletal:   RIGHT Right upper extremity demonstrates approximately 4 or 5 strength in the patient tells me this is unchanged due to a stroke. There is slight contusion with ecchymosis over the right lateral  shoulder,  fairly good range of motion however some tenderness with abduction. No tenderness over the mid humerus, right elbow demonstrates a skin tear which appears to be chronic and slow healing, no tenderness over the right elbow forearm or hand. There is slight contraction flexure of the extremity. Full range of motion of the right right upper extremity without pain. No evidence of trauma. Strong radial pulse. Intact median/ulnar/radial neuro-muscular exam.  LEFT Left upper extremity demonstrates normal strength, good use of all muscles. No edema bruising or contusions of the left shoulder/upper arm, left elbow, left forearm / hand. Full range of motion of the left  upper extremity without pain. No evidence of trauma. Strong radial pulse. Intact median/ulnar/radial neuro-muscular exam.  Lower Extremities  No edema. Normal DP/PT pulses bilateral with good cap refill.  Normal neuro-motor function lower extremities bilateral.  RIGHT Right lower extremity demonstrates 3 of 5 strength, and chronic contracture, the patient reports this is normal after a stroke. No edema bruising or contusions of the right hip, right knee, right ankle except for focal tenderness noted over the lower right femur. No pain on axial loading, and range of motion of the hip slightly limited due to contracture but no obvious deformity or pain in the hip joint.  No pain on axial loading.   LEFT Left lower extremity demonstrates normal strength, good use of all muscles. No edema bruising or contusions of the hip,  knee, ankle. Full range of motion of the left lower extremity without pain. No pain on axial loading. No evidence of trauma.   Neurologic:  Normal speech and language. No gait instability. Skin:  Skin is warm, dry and intact. No rash noted. Psychiatric: Mood and affect are normal. Speech and behavior are normal.  ____________________________________________   LABS (all labs ordered are listed, but only  abnormal results are displayed)  Labs Reviewed  CBC - Abnormal; Notable for the following:       Result Value   WBC 11.2 (*)    All other components within normal limits  COMPREHENSIVE METABOLIC PANEL - Abnormal; Notable for the following:    CO2 19 (*)    Glucose, Bld 127 (*)    BUN 39 (*)  Creatinine, Ser 1.24 (*)    ALT 11 (*)    GFR calc non Af Amer 42 (*)    GFR calc Af Amer 49 (*)    All other components within normal limits  URINALYSIS COMPLETEWITH MICROSCOPIC ([facility] ONLY) - Abnormal; Notable for the following:    Color, Urine YELLOW (*)    APPearance CLOUDY (*)    Hgb urine dipstick 1+ (*)    Protein, ur 100 (*)    Nitrite POSITIVE (*)    Leukocytes, UA 3+ (*)    Bacteria, UA RARE (*)    Squamous Epithelial / LPF 0-5 (*)    All other components within normal limits  TROPONIN I  CK   ____________________________________________  EKG Reviewed and are read me at 10 AM Heart rate 60 QRS 100 QTc 450 Normal sinus rhythm, frequent PACs. In minimal T-wave abnormality versus slight artifact noted in 1 and aVL. No evidence of acute ischemic changes noted however. Nonspecific T-wave abnormality in 1 and aVL  Note the patient has no cardiac or pulmonary complaint. ____________________________________________  RADIOLOGY  Dg Chest 2 View  Result Date: 03/05/2016 CLINICAL DATA:  75 year old female with a history of fall. Chest pain EXAM: CHEST  2 VIEW COMPARISON:  12/19/2015 FINDINGS: Cardiomediastinal silhouette unchanged in size and contour. Changes of prior coronary stenting. Cardiac pacing device on the left chest wall with 2 leads in place. Calcifications of the aortic arch. No pneumothorax or pleural effusion. Coarsened interstitial markings similar to prior study. No confluent airspace disease. Degenerative changes of the spine without fracture identified. Posttraumatic changes of the right humeral head again noted. Degenerative changes of the shoulders. IMPRESSION:  Chronic lung changes without evidence of superimposed acute cardiopulmonary disease. Aortic atherosclerosis. Evidence of prior coronary stenting. Unchanged cardiac pacing device on the left chest wall. Signed, Yvone NeuJaime S. Loreta AveWagner, DO [facility] Electronically Signed   By: Gilmer MorJaime  Wagner D.O.   On: 03/05/2016 13:14   Dg Pelvis 1-2 Views  Result Date: 03/05/2016 CLINICAL DATA:  [facility] sometime during the night. Patient has hematoma to the right side of the head, patient has right elbow skin tear, and has bruising on the right leg. Pt states severe right shoulder pain and limited rom and pain to RLE. 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 EXAM: PELVIS - 1-2 VIEW COMPARISON:  CT, 11/25/2015 FINDINGS: No fracture.  No bone lesion. Hip joints, SI joints and symphysis pubis are normally aligned. Bones are diffusely demineralized. There are multiple surgical vascular clips in the pelvis. Soft tissues are otherwise unremarkable. IMPRESSION: No fracture or dislocation. Electronically Signed   By: Amie Portlandavid  Ormond M.D.   On: 03/05/2016 11:46   Dg Shoulder Right  Result Date: 03/05/2016 CLINICAL DATA:  Patient brought in by [facility] from home for fall sometime during the night. Patient has hematoma to the right side of the head, patient has right elbow skin tear, and has bruising on the right leg. Pt states severe right shoulder pain and limited rom and pain to RLE. 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 EXAM: RIGHT SHOULDER - 2+ VIEW COMPARISON:  Chest radiograph, 11/25/2015 FINDINGS: No acute fracture. Old, healed fracture of the proximal right humerus. Glenohumeral AC joints are normally aligned. Bones are diffusely demineralized. Soft tissues are unremarkable. IMPRESSION: No acute fracture or dislocation. Electronically Signed   By: Amie Portlandavid  Ormond M.D.   On: 03/05/2016  11:43   Dg Tibia/fibula Right  Result Date: 03/05/2016 CLINICAL DATA:  Patient brought in by [facility] from home for fall sometime during the night. Patient has hematoma to the right side of the head, patient has right elbow skin tear, and has bruising on the right leg. Pt states severe right shoulder pain and limited rom and pain to RLE. 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 EXAM: RIGHT TIBIA AND FIBULA - 2 VIEW COMPARISON:  None. FINDINGS: No fracture.  No bone lesion. Knee and ankle joints are normally aligned. Bones are demineralized. Soft tissues are unremarkable. IMPRESSION: No fracture or dislocation. Electronically Signed   By: Amie Portland M.D.   On: 03/05/2016 11:45   Ct Head Wo Contrast  Result Date: 03/05/2016 CLINICAL DATA:  Fall during the night. Right head hematoma. Patient is on blood thinners. History of strokes with residual right weakness. EXAM: CT HEAD WITHOUT CONTRAST CT CERVICAL SPINE WITHOUT CONTRAST TECHNIQUE: Multidetector CT imaging of the head and cervical spine was performed following the standard protocol without intravenous contrast. Multiplanar CT image reconstructions of the cervical spine were also generated. COMPARISON:  12/19/2015 FINDINGS: CT HEAD FINDINGS Brain: The ventricles normal configuration. There is mild ex vacuo dilation of the left lateral ventricle due to an old infarct. Overall ventricular sizes are normal for age. There are no parenchymal masses or mass effect. There is an old left frontal infarct. There is no evidence of a recent infarct. There are no extra-axial masses or abnormal fluid collections. No intracranial hemorrhage. Vascular: No hyperdense vessel or unexpected calcification. Skull: Normal. Negative for fracture or focal lesion. Sinuses/Orbits: Globes and orbits are unremarkable. Visualized sinuses and mastoid air cells are clear. Other: Right frontal scalp hematoma. CT CERVICAL SPINE FINDINGS Alignment: Straightening  of the normal cervical lordosis. No spondylolisthesis. Skull base and vertebrae: No acute fracture. No primary bone lesion or focal pathologic process. Soft tissues and spinal canal: No prevertebral fluid or swelling. No visible canal hematoma. Disc levels: Moderate loss of disc height at C5-C6 with endplate osteophytes and spondylotic disc bulging. Mild loss disc height at C6-C7 with mild disc bulging. Facet degenerative change noted on the right at C3-C4-C4-C5. No significant stenosis. Upper chest: Unremarkable. Other: None IMPRESSION: HEAD CT:  No acute intracranial abnormalities.  No skull fracture. CERVICAL CT:  No fracture or acute finding. Electronically Signed   By: Amie Portland M.D.   On: 03/05/2016 11:02   Ct Cervical Spine Wo Contrast  Result Date: 03/05/2016 CLINICAL DATA:  Fall during the night. Right head hematoma. Patient is on blood thinners. History of strokes with residual right weakness. EXAM: CT HEAD WITHOUT CONTRAST CT CERVICAL SPINE WITHOUT CONTRAST TECHNIQUE: Multidetector CT imaging of the head and cervical spine was performed following the standard protocol without intravenous contrast. Multiplanar CT image reconstructions of the cervical spine were also generated. COMPARISON:  12/19/2015 FINDINGS: CT HEAD FINDINGS Brain: The ventricles normal configuration. There is mild ex vacuo dilation of the left lateral ventricle due to an old infarct. Overall ventricular sizes are normal for age. There are no parenchymal masses or mass effect. There is an old left frontal infarct. There is no evidence of a recent infarct. There are no extra-axial masses or abnormal fluid collections. No intracranial hemorrhage. Vascular: No hyperdense vessel or unexpected calcification. Skull: Normal. Negative for fracture or focal lesion. Sinuses/Orbits: Globes and orbits are unremarkable. Visualized sinuses and mastoid air cells are clear. Other: Right frontal scalp hematoma. CT CERVICAL SPINE FINDINGS  Alignment: Straightening of the normal cervical lordosis. No spondylolisthesis. Skull base and vertebrae: No acute fracture. No primary bone lesion or focal pathologic process. Soft tissues and spinal canal: No prevertebral fluid or swelling. No visible canal hematoma. Disc levels: Moderate loss of disc height at C5-C6 with endplate osteophytes and spondylotic disc bulging. Mild loss disc height at C6-C7 with mild disc bulging. Facet degenerative change noted on the right at C3-C4-C4-C5. No significant stenosis. Upper chest: Unremarkable. Other: None IMPRESSION: HEAD CT:  No acute intracranial abnormalities.  No skull fracture. CERVICAL CT:  No fracture or acute finding. Electronically Signed   By: Amie Portland M.D.   On: 03/05/2016 11:02   Dg Humerus Right  Result Date: 03/05/2016 CLINICAL DATA:  Patient brought in by [facility] from home for fall sometime during the night. Patient has hematoma to the right side of the head, patient has right elbow skin tear, and has bruising on the right leg. Pt states severe right shoulder pain and limited rom and pain to RLE. 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 EXAM: RIGHT HUMERUS - 2+ VIEW COMPARISON:  None. FINDINGS: No acute fracture. , old, healed fracture of the proximal right humerus. Shoulder and elbow joints are normally aligned. Bones are extensively demineralized cracks and bones are diffusely demineralized. Soft tissues are unremarkable. IMPRESSION: No acute fracture or dislocation. Electronically Signed   By: Amie Portland M.D.   On: 03/05/2016 11:43   Dg Femur Min 2 Views Right  Result Date: 03/05/2016 CLINICAL DATA:  Patient brought in by [facility] from home for fall sometime during the night. Patient has hematoma to the right side of the head, patient has right elbow skin tear, and has bruising on the right leg. Pt states severe right shoulder pain and limited rom and pain to RLE. Patient has hx/o multiple strokes with right sided  residual weakness. EXAM: RIGHT FEMUR 2 VIEWS COMPARISON:  None. FINDINGS: No acute fracture.  No bone lesion. Knee and hip joints are normally aligned. There are degenerative changes of the knee joint. Bones are demineralized. Soft tissues are unremarkable. IMPRESSION: No fracture or dislocation. Electronically Signed   By: Amie Portland M.D.   On: 03/05/2016 11:45    ____________________________________________   PROCEDURES  Procedure(s) performed: None  Procedures  Critical Care performed: No  ____________________________________________   INITIAL IMPRESSION / ASSESSMENT AND PLAN / ED COURSE  Pertinent labs & imaging results that were available during my care of the patient were reviewed by me and considered in my medical decision making (see chart for details).  Patient presents after an unwitnessed fall. The patient has a history of falling, and she does not have any systemic complaints chest pain or cardiopulmonary complaint. He is neurologically intact, just slightly disoriented to time which was evidently normal for her.  Mentation noted to have contusions of the right shoulder and right knee, also scalp hematoma and skin tear. Skin tear bandage and wound care provided. No evidence of acute abnormality on laboratory testing her 12-lead. She has known mild renal insufficiency. Urinalysis is questionable for a possible UTI and was treated with Monuril.  Patient will be able to be followed up with her primary care doctor. She lives with her daughter, and the patient after receiving pain medicine the year was able to stand, ambulate with walker with stable gait, reports her pain is improved, and was able to eat.  Patient will be discharged home, provided prescription for tramadol for relief of pain  from the fall and contusion. Return precautions and treatment recommendations and follow-up discussed with the patient who is agreeable with the plan.   Clinical Course      ____________________________________________   FINAL CLINICAL IMPRESSION(S) / ED DIAGNOSES  Final diagnoses:  Skin tear of elbow without complication, right, initial encounter  Fall, initial encounter  Hematoma of right parietal scalp, initial encounter  Contusion of right knee, initial encounter      NEW MEDICATIONS STARTED DURING THIS VISIT:  New Prescriptions   TRAMADOL (ULTRAM) 50 MG TABLET    Take 1 tablet (50 mg total) by mouth every 6 (six) hours as needed.     Note:  This document was prepared using Dragon voice recognition software and may include unintentional dictation errors.     Sharyn Creamer, MD 03/05/16 1443  ----------------------------------------- 3:56 PM on 03/05/2016 -----------------------------------------  Patient's daughter has now arrived. Patient's daughter noted to me that she is concerned about lack of communication from the ER team today. I spent several minutes discussing questions with her, and did reassure the daughter that I had seen her mother at the time that she arrived while still with the care of EMS receiving report from paramedics directly. In addition, the patient's daughter was not present in the room when I had reevaluated the patient and she noted that she had to step out for a while. I apologized and noted that certainly we had been following and providing clinical care received at reevaluated her, but it would appear that my time spent with the patient was during periods at the daughter was not present in the room. I updated her with plan of care is reviewed the results of her x-rays and CT as well as blood work. Discussed with the daughter and she is concerned about elevated blood pressure, and the patient's blood pressure has been running approximately 180-200s, patient's daughter reports that she had not had any blood pressure medicine today and her normal blood pressures about 160. I will write her for her daily blood pressure  medicine, and her daughter will plan to hold these medications this evening and restart her blood pressure medicines again tomorrow.  Ongoing care assigned to Dr. Huel Cote, plan of care is to continue to observe the patient after receiving her blood pressure medicine and once her blood pressure has improved (goal < 185), feel the patient can be safely discharged. Daughter is in agreement with this plan.     Sharyn Creamer, MD 03/05/16 [phone]

## 2016-03-05 NOTE — ED Provider Notes (Signed)
-----------------------------------------   6:05 PM on 03/05/2016 -----------------------------------------   Blood pressure (!) 139/45, pulse (!) 56, temperature 98.2 F (36.8 C), temperature source Oral, resp. rate 16, height 5' (1.524 m), weight 160 lb 0.9 oz (72.6 kg), SpO2 94 %.  Assuming care from Dr. Fanny BienQuale.  In short, Sally Curry is a 75 y.o. female with a chief complaint of Fall and Head Injury .  Refer to the original H&P for additional details.  The current plan of care is to follow patient's blood pressure which seemed to resolve after administering her normal medications here in emergency department. There was attempts to discharge the patient that her daughter is concerned about her generalized weakness and whether or not she is a risk to fall again and whether or not she can take care of her at home with her weakness. Does not appear to be any focal weakness or review of her laboratory work and radiology studies only showed a urinary tract infection at this time. Patient apparently has urinary tract infections on a chronic basis and was given oral medication here. She is currently afebrile. I spoke to the hospitalist team, further disposition and management depends upon their evaluation.    Jennye MoccasinBrian S Quigley, MD 03/05/16 [phone]

## 2016-03-05 NOTE — ED Notes (Signed)
Patient daughter reports that patient is weaker than her baseline. Daughter request to speak with MD, RN told her that Dr. Fanny BienQuale was gone but would inform MD that has taken over for him. Dr. Huel CoteQuigley informed and he will speak to patient's daughter.

## 2016-03-05 NOTE — ED Notes (Signed)
Patient daughter came to nurses station stating that patient is now c/o chest pain. Order for EKG put in and [place], ED tech to repeat EKG

## 2016-03-05 NOTE — ED Notes (Signed)
Patient transported to CT 

## 2016-03-05 NOTE — ED Triage Notes (Signed)
[facility] for fall sometime during the night. Patient has hematoma to the right side of the head, patient has right elbow skin tear, patient also has bruising on the right leg. Patient is on blood thinners. 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

## 2016-03-05 NOTE — ED Notes (Signed)
Patient sleeping

## 2016-03-05 NOTE — ED Notes (Signed)
Patient daughter called to check status of patient. Patient daughter informed that we have not gotten any results back. Patient daughter asked if we were ready for patient to have visitors. I informed daughter that patient could have visitors at any time

## 2016-03-05 NOTE — ED Notes (Signed)
Patient requesting medication for pain. Dr. Huel CoteQuigley informed, Dr. Huel CoteQuigley would like for patient to be seen by admitting MD before any pain medication is ordered for patient.  Patient informed

## 2016-03-06 LAB — CBC
HCT: 32.8 % — ABNORMAL LOW (ref 35.0–47.0)
HEMOGLOBIN: 11.2 g/dL — AB (ref 12.0–16.0)
MCH: 32.3 pg (ref 26.0–34.0)
MCHC: 34.2 g/dL (ref 32.0–36.0)
MCV: 94.4 fL (ref 80.0–100.0)
PLATELETS: 170 10*3/uL (ref 150–440)
RBC: 3.47 MIL/uL — ABNORMAL LOW (ref 3.80–5.20)
RDW: 13.7 % (ref 11.5–14.5)
WBC: 6.2 10*3/uL (ref 3.6–11.0)

## 2016-03-06 LAB — BASIC METABOLIC PANEL
Anion gap: 7 (ref 5–15)
BUN: 36 mg/dL — ABNORMAL HIGH (ref 6–20)
CALCIUM: 8.8 mg/dL — AB (ref 8.9–10.3)
CHLORIDE: 116 mmol/L — AB (ref 101–111)
CO2: 18 mmol/L — AB (ref 22–32)
CREATININE: 1.18 mg/dL — AB (ref 0.44–1.00)
GFR calc Af Amer: 52 mL/min — ABNORMAL LOW (ref >60)
GFR calc non Af Amer: 45 mL/min — ABNORMAL LOW (ref >60)
GLUCOSE: 82 mg/dL (ref 65–99)
Potassium: 3.5 mmol/L (ref 3.5–5.1)
Sodium: 141 mmol/L (ref 135–145)

## 2016-03-06 LAB — C DIFFICILE QUICK SCREEN W PCR REFLEX
C DIFFICLE (CDIFF) ANTIGEN: POSITIVE — AB
C Diff toxin: NEGATIVE

## 2016-03-06 LAB — CLOSTRIDIUM DIFFICILE BY PCR: Toxigenic C. Difficile by PCR: POSITIVE — AB

## 2016-03-06 MED ORDER — METRONIDAZOLE 500 MG PO TABS
500.0000 mg | ORAL_TABLET | Freq: Three times a day (TID) | ORAL | Status: DC
  Administered 2016-03-06 – 2016-03-07 (×3): 500 mg via ORAL
  Filled 2016-03-06 (×3): qty 1

## 2016-03-06 NOTE — Progress Notes (Signed)
Made Dr. Cherlynn KaiserSainani aware pt positive for c.diff.  New orders received for flagyl 500mg  po q 8hrs.

## 2016-03-06 NOTE — Progress Notes (Signed)
CSW received consult for possible SNF placement. PT is pending. CSW will continue to watch patient to determine discharge needs.  Woodroe Modehristina Michai Dieppa, MSW, LCSW, LCAS-A Clinical Social Worker [phone]

## 2016-03-06 NOTE — Care Management Note (Addendum)
Case Management Note  Patient Details  Name: Sally Curry MRN: [id number] Date of Birth: 1940/12/11  Subjective/Objective:        74yo Mrs Sally Curry was admitted after a fall at home, hematoma on head, UTI, and elevated BP in ED. Hx: Dementia and right sided weakness from old CVA. Resides with daughter who is her primary caretaker. Has a Rrolling walker at home. No home oxygen.Marland Kitchen. PCP= Dr Leotis ShamesJasmine Singh. Pharmacy=[facility]. Case management will follow for discharge planning. Anticipate discharge home with daughter and home health services.           Action/Plan:   Expected Discharge Date:                  Expected Discharge Plan:     In-House Referral:     Discharge planning Services     Post Acute Care Choice:    Choice offered to:     DME Arranged:    DME Agency:     HH Arranged:    HH Agency:     Status of Service:     If discussed at [employer] Length of Stay Meetings, dates discussed:    Additional Comments:  Sakshi Sermons A, RN 03/06/2016, 7:00 AM

## 2016-03-06 NOTE — Progress Notes (Signed)
[facility] - [place] at [facility]   PATIENT NAME: Sally Curry    MR#:  [id number]  DATE OF BIRTH:  07-16-1940  SUBJECTIVE:   Patient here as she was found down by her daughter. She was noted to have a urinary tract infection. Clinically much improved this morning. Daughter at bedside and says that she is close to baseline. Patient having some loose stools this morning.  REVIEW OF SYSTEMS:    Review of Systems  Constitutional: Negative for chills and fever.  HENT: Negative for congestion and tinnitus.   Eyes: Negative for blurred vision and double vision.  Respiratory: Negative for cough, shortness of breath and wheezing.   Cardiovascular: Negative for chest pain, orthopnea and PND.  Gastrointestinal: Positive for diarrhea. Negative for abdominal pain, nausea and vomiting.  Genitourinary: Negative for dysuria and hematuria.  Neurological: Positive for weakness (generalized. ). Negative for dizziness, sensory change and focal weakness.  All other systems reviewed and are negative.   Nutrition: Heart healthy Tolerating Diet: Yes Tolerating PT:  Await Eval     DRUG ALLERGIES:  No Known Allergies  VITALS:  Blood pressure (!) 170/54, pulse 67, temperature 99.5 F (37.5 C), temperature source Oral, resp. rate 17, height 5' (1.524 m), weight 66 kg (145 lb 9.6 oz), SpO2 99 %.  PHYSICAL EXAMINATION:   Physical Exam  GENERAL:  75 y.o.-year-old patient lying in the bed in no acute distress.  EYES: Pupils equal, round, reactive to light and accommodation. No scleral icterus. Extraocular muscles intact.  HEENT: Head atraumatic, normocephalic. Oropharynx and nasopharynx clear.  NECK:  Supple, no jugular venous distention. No thyroid enlargement, no tenderness.  LUNGS: Normal breath sounds bilaterally, no wheezing, rales, rhonchi. No use of accessory muscles of respiration.  CARDIOVASCULAR: S1, S2 normal. II/VI SEM at RSB, No rubs, or gallops.  ABDOMEN: Soft,  nontender, nondistended. Bowel sounds present. No organomegaly or mass.  EXTREMITIES: No cyanosis, clubbing or edema b/l.    NEUROLOGIC: Cranial nerves II through XII are intact. No focal Motor or sensory deficits b/l.  PSYCHIATRIC: The patient is alert and oriented x 2.  SKIN: No obvious rash, lesion, or ulcer.    LABORATORY PANEL:   CBC  Recent Labs Lab 03/06/16 0424  WBC 6.2  HGB 11.2*  HCT 32.8*  PLT 170   ------------------------------------------------------------------------------------------------------------------  Chemistries   Recent Labs Lab 03/05/16 1017 03/06/16 0424  NA 140 141  K 3.9 3.5  CL 111 116*  CO2 19* 18*  GLUCOSE 127* 82  BUN 39* 36*  CREATININE 1.24* 1.18*  CALCIUM 9.3 8.8*  MG 1.9  --   AST 26  --   ALT 11*  --   ALKPHOS 91  --   BILITOT 1.0  --    ------------------------------------------------------------------------------------------------------------------  Cardiac Enzymes  Recent Labs Lab 03/05/16 1017  TROPONINI <0.03   ------------------------------------------------------------------------------------------------------------------  RADIOLOGY:  Dg Chest 2 View  Result Date: 03/05/2016 CLINICAL DATA:  75 year old female with a history of fall. Chest pain EXAM: CHEST  2 VIEW COMPARISON:  12/19/2015 FINDINGS: Cardiomediastinal silhouette unchanged in size and contour. Changes of prior coronary stenting. Cardiac pacing device on the left chest wall with 2 leads in place. Calcifications of the aortic arch. No pneumothorax or pleural effusion. Coarsened interstitial markings similar to prior study. No confluent airspace disease. Degenerative changes of the spine without fracture identified. Posttraumatic changes of the right humeral head again noted. Degenerative changes of the shoulders. IMPRESSION: Chronic lung changes without evidence of superimposed acute  cardiopulmonary disease. Aortic atherosclerosis. Evidence of prior coronary  stenting. Unchanged cardiac pacing device on the left chest wall. Signed, Yvone Neu. Loreta Ave, DO [facility] Radiology Electronically Signed   By: Gilmer Mor D.O.   On: 03/05/2016 13:14   Dg Pelvis 1-2 Views  Result Date: 03/05/2016 CLINICAL DATA:  Patient brought in by [facility] from home for fall sometime during the night. Patient has hematoma to the right side of the head, patient has right elbow skin tear, and has bruising on the right leg. Pt states severe right shoulder pain and limited rom and pain to RLE. 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 EXAM: PELVIS - 1-2 VIEW COMPARISON:  CT, 11/25/2015 FINDINGS: No fracture.  No bone lesion. Hip joints, SI joints and symphysis pubis are normally aligned. Bones are diffusely demineralized. There are multiple surgical vascular clips in the pelvis. Soft tissues are otherwise unremarkable. IMPRESSION: No fracture or dislocation. Electronically Signed   By: Amie Portland M.D.   On: 03/05/2016 11:46   Dg Shoulder Right  Result Date: 03/05/2016 CLINICAL DATA:  Patient brought in by [facility] from home for fall sometime during the night. Patient has hematoma to the right side of the head, patient has right elbow skin tear, and has bruising on the right leg. Pt states severe right shoulder pain and limited rom and pain to RLE. 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 EXAM: RIGHT SHOULDER - 2+ VIEW COMPARISON:  Chest radiograph, 11/25/2015 FINDINGS: No acute fracture. Old, healed fracture of the proximal right humerus. Glenohumeral AC joints are normally aligned. Bones are diffusely demineralized. Soft tissues are unremarkable. IMPRESSION: No acute fracture or dislocation. Electronically Signed   By: Amie Portland M.D.   On: 03/05/2016 11:43   Dg Tibia/fibula Right  Result Date: 03/05/2016 CLINICAL DATA:  Patient brought in by [facility] from home for fall sometime  during the night. Patient has hematoma to the right side of the head, patient has right elbow skin tear, and has bruising on the right leg. Pt states severe right shoulder pain and limited rom and pain to RLE. 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 EXAM: RIGHT TIBIA AND FIBULA - 2 VIEW COMPARISON:  None. FINDINGS: No fracture.  No bone lesion. Knee and ankle joints are normally aligned. Bones are demineralized. Soft tissues are unremarkable. IMPRESSION: No fracture or dislocation. Electronically Signed   By: Amie Portland M.D.   On: 03/05/2016 11:45   Ct Head Wo Contrast  Result Date: 03/05/2016 CLINICAL DATA:  Fall during the night. Right head hematoma. Patient is on blood thinners. History of strokes with residual right weakness. EXAM: CT HEAD WITHOUT CONTRAST CT CERVICAL SPINE WITHOUT CONTRAST TECHNIQUE: Multidetector CT imaging of the head and cervical spine was performed following the standard protocol without intravenous contrast. Multiplanar CT image reconstructions of the cervical spine were also generated. COMPARISON:  12/19/2015 FINDINGS: CT HEAD FINDINGS Brain: The ventricles normal configuration. There is mild ex vacuo dilation of the left lateral ventricle due to an old infarct. Overall ventricular sizes are normal for age. There are no parenchymal masses or mass effect. There is an old left frontal infarct. There is no evidence of a recent infarct. There are no extra-axial masses or abnormal fluid collections. No intracranial hemorrhage. Vascular: No hyperdense vessel or unexpected calcification. Skull: Normal. Negative for fracture or focal lesion. Sinuses/Orbits: Globes and orbits are unremarkable. Visualized sinuses and mastoid air cells  are clear. Other: Right frontal scalp hematoma. CT CERVICAL SPINE FINDINGS Alignment: Straightening of the normal cervical lordosis. No spondylolisthesis. Skull base and vertebrae: No acute fracture. No primary bone lesion or focal  pathologic process. Soft tissues and spinal canal: No prevertebral fluid or swelling. No visible canal hematoma. Disc levels: Moderate loss of disc height at C5-C6 with endplate osteophytes and spondylotic disc bulging. Mild loss disc height at C6-C7 with mild disc bulging. Facet degenerative change noted on the right at C3-C4-C4-C5. No significant stenosis. Upper chest: Unremarkable. Other: None IMPRESSION: HEAD CT:  No acute intracranial abnormalities.  No skull fracture. CERVICAL CT:  No fracture or acute finding. Electronically Signed   By: Amie Portland M.D.   On: 03/05/2016 11:02   Ct Cervical Spine Wo Contrast  Result Date: 03/05/2016 CLINICAL DATA:  Fall during the night. Right head hematoma. Patient is on blood thinners. History of strokes with residual right weakness. EXAM: CT HEAD WITHOUT CONTRAST CT CERVICAL SPINE WITHOUT CONTRAST TECHNIQUE: Multidetector CT imaging of the head and cervical spine was performed following the standard protocol without intravenous contrast. Multiplanar CT image reconstructions of the cervical spine were also generated. COMPARISON:  12/19/2015 FINDINGS: CT HEAD FINDINGS Brain: The ventricles normal configuration. There is mild ex vacuo dilation of the left lateral ventricle due to an old infarct. Overall ventricular sizes are normal for age. There are no parenchymal masses or mass effect. There is an old left frontal infarct. There is no evidence of a recent infarct. There are no extra-axial masses or abnormal fluid collections. No intracranial hemorrhage. Vascular: No hyperdense vessel or unexpected calcification. Skull: Normal. Negative for fracture or focal lesion. Sinuses/Orbits: Globes and orbits are unremarkable. Visualized sinuses and mastoid air cells are clear. Other: Right frontal scalp hematoma. CT CERVICAL SPINE FINDINGS Alignment: Straightening of the normal cervical lordosis. No spondylolisthesis. Skull base and vertebrae: No acute fracture. No primary bone  lesion or focal pathologic process. Soft tissues and spinal canal: No prevertebral fluid or swelling. No visible canal hematoma. Disc levels: Moderate loss of disc height at C5-C6 with endplate osteophytes and spondylotic disc bulging. Mild loss disc height at C6-C7 with mild disc bulging. Facet degenerative change noted on the right at C3-C4-C4-C5. No significant stenosis. Upper chest: Unremarkable. Other: None IMPRESSION: HEAD CT:  No acute intracranial abnormalities.  No skull fracture. CERVICAL CT:  No fracture or acute finding. Electronically Signed   By: Amie Portland M.D.   On: 03/05/2016 11:02   Dg Humerus Right  Result Date: 03/05/2016 CLINICAL DATA:  Patient brought in by [facility] from home for fall sometime during the night. Patient has hematoma to the right side of the head, patient has right elbow skin tear, and has bruising on the right leg. Pt states severe right shoulder pain and limited rom and pain to RLE. 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 EXAM: RIGHT HUMERUS - 2+ VIEW COMPARISON:  None. FINDINGS: No acute fracture. , old, healed fracture of the proximal right humerus. Shoulder and elbow joints are normally aligned. Bones are extensively demineralized cracks and bones are diffusely demineralized. Soft tissues are unremarkable. IMPRESSION: No acute fracture or dislocation. Electronically Signed   By: Amie Portland M.D.   On: 03/05/2016 11:43   Dg Femur Min 2 Views Right  Result Date: 03/05/2016 CLINICAL DATA:  Patient brought in by [facility] from home for fall sometime during the night. Patient has hematoma to the right side of the head, patient has right elbow  skin tear, and has bruising on the right leg. Pt states severe right shoulder pain and limited rom and pain to RLE. 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 EXAM: RIGHT FEMUR 2 VIEWS COMPARISON:  None. FINDINGS: No acute fracture.  No bone lesion. Knee and hip joints are  normally aligned. There are degenerative changes of the knee joint. Bones are demineralized. Soft tissues are unremarkable. IMPRESSION: No fracture or dislocation. Electronically Signed   By: Amie Portlandavid  Ormond M.D.   On: 03/05/2016 11:45     ASSESSMENT AND PLAN:   75 year old female with past medical history of peripheral vascular disease, history of previous CVA, glaucoma, dementia, CHF, coronary artery disease, history of previous UTI who presented to the hospital after a fall and being found down on the floor by her daughter and also noted to have urinary tract infection.   1. Status post fall-suspected to be a mechanical fall. -Patient's traumaworkup including CT head, cervical spine are essentially normal. -Await physical therapy evaluation.  2. Urinary tract infection-continue ciprofloxacin. Follow urine cultures.  3. Altered mental status-metabolic encephalopathy secondary to the UTI. Improved today with antibiotics. -We'll monitor.  4. Essential hypertension-continue Norvasc, Imdur, Toprol.  5. GERD-continue Protonix.  6. History of gout-continue allopurinol. No acute attack.  7. Depression-continue Lexapro.   All the records are reviewed and case discussed with Care Management/Social Worker. Management plans discussed with the patient, family and they are in agreement.  CODE STATUS: Full  DVT Prophylaxis: Lovenox  TOTAL TIME TAKING CARE OF THIS PATIENT: 30 minutes.   POSSIBLE D/C IN 1-2 DAYS, DEPENDING ON CLINICAL CONDITION.   Houston SirenSAINANI,VIVEK J M.D on 03/06/2016 at 1:57 PM  Between 7am to 6pm - Pager - [phone]  After 6pm go to [email or web address] - Social research officer, governmentpassword EPAS [email or web address]  [employer]  Hospitalists  Office  [phone]  CC: Primary care physician; Leotis ShamesSingh,Jasmine, MD

## 2016-03-06 NOTE — Evaluation (Signed)
Physical Therapy Evaluation Patient Details Name: Sally DimmerDawn Monroy MRN: [id number] DOB: Sep 10, 1940 Today's Date: 03/06/2016   History of Present Illness  presented to ER status post fall, AMS; admitted with UTI, failure to thrive.  CTH negative for acute injury; all orthopedic imaging (cervical, R tib/fib, femur, humerus, shoulder and pelvis) negative for acute osseous injury.  Clinical Impression  Upon evaluation, patient alert and oriented; follows commands and demonstrates good insight/safety awareness.  Baseline R hemiparesis evident with increased flexor tone noted on exertion (unchanged with this admission).  Patient currently able to complete supine/sit with cga/min assist; sit/stand, basic transfers and gait (120') with RW, cga.  Slow and cautious, but no buckling or LOB.  Higher-level balance deficits evident; recommend patient remain on main level of home upon immediate discharge home until strength returns.  Patient voiced understanding. Would benefit from skilled PT to address above deficits and promote optimal return to PLOF; Recommend transition to HHPT upon discharge from acute hospitalization.     Follow Up Recommendations Home health PT    Equipment Recommendations       Recommendations for Other Services       Precautions / Restrictions Precautions Precautions: Fall Precaution Comments: PPM Restrictions Weight Bearing Restrictions: No      Mobility  Bed Mobility Overal bed mobility: Needs Assistance Bed Mobility: Supine to Sit     Supine to sit: Min assist     General bed mobility comments: transition towards R  Transfers Overall transfer level: Needs assistance Equipment used: Rolling walker (2 wheeled) Transfers: Sit to/from Stand Sit to Stand: Min guard         General transfer comment: requires UE support to complete  Ambulation/Gait Ambulation/Gait assistance: Min guard Ambulation Distance (Feet): 120 Feet Assistive device: Rolling walker (2  wheeled)       General Gait Details: step to gait pattern; mild R genu recurvatum in loading (due to baseline R ankle PF contracture; reports wears brace at baseline).  Slow and cautious, but no buckling or LOB.  Patient feels performance is near baseline for her.  Stairs            Wheelchair Mobility    Modified Rankin (Stroke Patients Only)       Balance Overall balance assessment: Needs assistance Sitting-balance support: No upper extremity supported;Feet supported Sitting balance-Leahy Scale: Good     Standing balance support: Bilateral upper extremity supported Standing balance-Leahy Scale: Fair                               Pertinent Vitals/Pain Pain Assessment: Faces Faces Pain Scale: Hurts little more Pain Location: generalized soreness Pain Descriptors / Indicators: Sore Pain Intervention(s): Limited activity within patient's tolerance;Monitored during session;Repositioned    Home Living Family/patient expects to be discharged to:: Private residence Living Arrangements: Children Available Help at Discharge: Family;Available PRN/intermittently Type of Home: House Home Access: Stairs to enter Entrance Stairs-Rails: None Entrance Stairs-Number of Steps: 1 Home Layout: Two level;Able to live on main level with bedroom/bathroom (full bathroom on upper level of home (typically accesses for shower every other day)) Home Equipment: Walker - 2 wheels;Walker - 4 wheels;Tub bench;Grab bars - tub/shower;Wheelchair - manual Additional Comments: Daughter is Sports administratorschool-teacher, works outside of home; patient alone at home while daughter working.    Prior Function Level of Independence: Independent with assistive device(s)         Comments: Mod indep for ADLs, household mobility using 4WRW; does  require assist to don shoes.        Hand Dominance   Dominant Hand: Left (since CVA approx 17 years prior)    Extremity/Trunk Assessment   Upper Extremity  Assessment:  (R UE shoulder elevation limited to approx 30 degrees act assist ROM, generalized tone 2/4 and strength 3-/5 throughout (baseline after CVA); L UE grossly WFL)           Lower Extremity Assessment:  (R LE grossly 2+ to 3-/5 throughout, tone 2/4 with poor dissociation of joints.  L LE grossly WFL)         Communication   Communication: No difficulties  Cognition Arousal/Alertness: Awake/alert Behavior During Therapy: WFL for tasks assessed/performed Overall Cognitive Status: Within Functional Limits for tasks assessed                      General Comments      Exercises Other Exercises Other Exercises: Rolling bilat, sup with use of bedrails, for assist with hygiene/clothing management in supine.   Assessment/Plan    PT Assessment Patient needs continued PT services  PT Problem List Decreased strength;Decreased range of motion;Decreased activity tolerance;Decreased balance;Decreased mobility;Decreased coordination;Decreased knowledge of use of DME;Decreased safety awareness;Decreased knowledge of precautions          PT Treatment Interventions DME instruction;Gait training;Stair training;Functional mobility training;Therapeutic activities;Therapeutic exercise;Balance training;Patient/family education    PT Goals (Current goals can be found in the Care Plan section)  Acute Rehab PT Goals Patient Stated Goal: to return home when able PT Goal Formulation: With patient Time For Goal Achievement: 03/20/16 Potential to Achieve Goals: Good    Frequency Min 2X/week   Barriers to discharge Decreased caregiver support      Co-evaluation               End of Session Equipment Utilized During Treatment: Gait belt Activity Tolerance: Patient tolerated treatment well Patient left: in chair;with call bell/phone within reach;with chair alarm set Nurse Communication: Mobility status         Time: 4098-1191 PT Time Calculation (min) (ACUTE ONLY):  29 min   Charges:   PT Evaluation $PT Eval Low Complexity: 1 Procedure PT Treatments $Therapeutic Activity: 8-22 mins   PT G Codes:        Haelee Bolen H. Manson Passey, PT, DPT, NCS 03/06/16, 5:02 PM [phone]

## 2016-03-07 MED ORDER — VANCOMYCIN 50 MG/ML ORAL SOLUTION
125.0000 mg | Freq: Four times a day (QID) | ORAL | Status: DC
  Administered 2016-03-07 – 2016-03-10 (×13): 125 mg via ORAL
  Filled 2016-03-07 (×15): qty 2.5

## 2016-03-07 MED ORDER — HYDRALAZINE HCL 20 MG/ML IJ SOLN
10.0000 mg | INTRAMUSCULAR | Status: DC | PRN
  Administered 2016-03-07 – 2016-03-08 (×2): 10 mg via INTRAVENOUS
  Filled 2016-03-07 (×2): qty 1

## 2016-03-07 MED ORDER — NITROGLYCERIN 2 % TD OINT: 0.5000 [in_us] | TOPICAL_OINTMENT | Freq: Four times a day (QID) | TRANSDERMAL | Status: DC | PRN

## 2016-03-07 MED ORDER — RISAQUAD PO CAPS
1.0000 | ORAL_CAPSULE | Freq: Every day | ORAL | Status: DC
  Administered 2016-03-07 – 2016-03-10 (×4): 1 via ORAL
  Filled 2016-03-07 (×4): qty 1

## 2016-03-07 MED ORDER — NITROGLYCERIN 2 % TD OINT
0.5000 [in_us] | TOPICAL_OINTMENT | Freq: Four times a day (QID) | TRANSDERMAL | Status: DC
  Administered 2016-03-07: 05:00:00 0.5 [in_us] via TOPICAL
  Filled 2016-03-07: qty 1

## 2016-03-07 NOTE — Progress Notes (Signed)
Pt's daughter here asking to speak to this nurse.  Daughter complaining that no one has spoken to her about her mother, that she doesn't know what the plan of care is, and that no one has actually told her if her mother has c.diff.  However, this is not true as I personally spoke with her this morning on the phone while I was in the pt's room with Dr. Cherlynn KaiserSainani.  After I spoke with the pt's daughter on the phone this morning, I then handed the phone to Dr. Cherlynn KaiserSainani and he also spoke on the phone with the pt's daughter.  When I brought this to the daughter's attention, she then remembered speaking with both of us. Pt's daughter upset that pt hasn't worked with PT today and made her aware that pt refused PT today.  Pt's daughter stating that we need to "force the pt to work with PT."  Made her aware that we cannot force the pt to do anything however we will continue to encourage pt to work with PT.  Daughter unhappy with this and requesting to speak with charge nurse.  Sally Apeanielle Johnetta Sloniker, RN

## 2016-03-07 NOTE — Progress Notes (Signed)
PT Cancellation Note  Patient Details Name: Nash DimmerDawn Frett MRN: [id number] DOB: 1941-02-22   Cancelled Treatment:    Reason Eval/Treat Not Completed: Other (comment) (Treatment session re-attempted.  Patient politely declining re-attempt due to fatigue and "not feeling up to it right now".  Denied additional needs at this time.  Will continue efforts as appropriate.)   Ulices Maack H. Manson PasseyBrown, PT, DPT, NCS 03/07/16, 11:13 AM [phone]

## 2016-03-07 NOTE — Progress Notes (Signed)
[facility] [place] at [facility]   PATIENT NAME: Sally Curry    MR#:  [id number]  DATE OF BIRTH:  30-Dec-1940  SUBJECTIVE:   Patient here as she was found down by her daughter. She was noted to have a urinary tract infection. Clinically much improved this morning. Daughter at bedside and says that she is close to baseline. Patient having some loose stools this morning.  REVIEW OF SYSTEMS:    Review of Systems  Constitutional: Negative for chills and fever.  HENT: Negative for congestion and tinnitus.   Eyes: Negative for blurred vision and double vision.  Respiratory: Negative for cough, shortness of breath and wheezing.   Cardiovascular: Negative for chest pain, orthopnea and PND.  Gastrointestinal: Positive for diarrhea. Negative for abdominal pain, nausea and vomiting.  Genitourinary: Negative for dysuria and hematuria.  Neurological: Positive for weakness (generalized. ). Negative for dizziness, sensory change and focal weakness.  All other systems reviewed and are negative.   Nutrition: Heart healthy Tolerating Diet: Yes Tolerating PT:  Await Eval     DRUG ALLERGIES:  No Known Allergies  VITALS:  Blood pressure (!) 133/39, pulse 63, temperature 98.4 F (36.9 C), temperature source Oral, resp. rate 16, height 5' (1.524 m), weight 66 kg (145 lb 9.6 oz), SpO2 95 %.  PHYSICAL EXAMINATION:   Physical Exam  GENERAL:  75 y.o.-year-old patient lying in the bed in no acute distress.  EYES: Pupils equal, round, reactive to light and accommodation. No scleral icterus. Extraocular muscles intact.  HEENT: Head atraumatic, normocephalic. Oropharynx and nasopharynx clear.  NECK:  Supple, no jugular venous distention. No thyroid enlargement, no tenderness.  LUNGS: Normal breath sounds bilaterally, no wheezing, rales, rhonchi. No use of accessory muscles of respiration.  CARDIOVASCULAR: S1, S2 normal. II/VI SEM at RSB, No rubs, or gallops.  ABDOMEN: Soft,  nontender, nondistended. Bowel sounds present. No organomegaly or mass.  EXTREMITIES: No cyanosis, clubbing or edema b/l.    NEUROLOGIC: Cranial nerves II through XII are intact. No focal Motor or sensory deficits b/l.  PSYCHIATRIC: The patient is alert and oriented x 2.  SKIN: No obvious rash, lesion, or ulcer.    LABORATORY PANEL:   CBC  Recent Labs Lab 03/06/16 0424  WBC 6.2  HGB 11.2*  HCT 32.8*  PLT 170   ------------------------------------------------------------------------------------------------------------------  Chemistries   Recent Labs Lab 03/05/16 1017 03/06/16 0424  NA 140 141  K 3.9 3.5  CL 111 116*  CO2 19* 18*  GLUCOSE 127* 82  BUN 39* 36*  CREATININE 1.24* 1.18*  CALCIUM 9.3 8.8*  MG 1.9  --   AST 26  --   ALT 11*  --   ALKPHOS 91  --   BILITOT 1.0  --    ------------------------------------------------------------------------------------------------------------------  Cardiac Enzymes  Recent Labs Lab 03/05/16 1017  TROPONINI <0.03   ------------------------------------------------------------------------------------------------------------------  RADIOLOGY:  No results found.   ASSESSMENT AND PLAN:   75 year old female with past medical history of peripheral vascular disease, history of previous CVA, glaucoma, dementia, CHF, coronary artery disease, history of previous UTI who presented to the hospital after a fall and being found down on the floor by her daughter and also noted to have urinary tract infection.   1. Status post fall-suspected to be a mechanical fall. -Patient's trauma workup including CT head, cervical spine are essentially normal. -seen by PT and they recommend Home health PT and will arrange prior to discharge.   2. Urinary tract infection-continue ciprofloxacin.  -  urine cultures growing E.coli but await sensitivities.   3. C. Diff diarrhea - pt. Was having loose stools yesterday and noted to be + for C.  Diff.  - this is recurrent c. Diff as pt. Had an episode 3 months back - will start on Oral Vanc and monitor.   4. Altered mental status-metabolic encephalopathy secondary to the UTI.  - improved and back to baseline.  5. Essential hypertension-continue Norvasc, Imdur, Toprol.  6. GERD-continue Protonix.  7. History of gout-continue allopurinol. No acute attack.  8. Depression-continue Lexapro.   All the records are reviewed and case discussed with Care Management/Social Worker. Management plans discussed with the patient, family and they are in agreement.  CODE STATUS: Full  DVT Prophylaxis: Lovenox  TOTAL TIME TAKING CARE OF THIS PATIENT: 30 minutes.   POSSIBLE D/C IN 1-2 DAYS, DEPENDING ON CLINICAL CONDITION.   Houston Siren M.D on 03/07/2016 at 2:23 PM  Between 7am to 6pm - Pager - [phone]  After 6pm go to [email or web address] - Social research officer, government  [employer] [facility]  Office  [phone]  CC: Primary care physician; Leotis Shames, MD

## 2016-03-07 NOTE — Progress Notes (Signed)
PT Cancellation Note  Patient Details Name: Sally Curry MRN: [id number] DOB: 08/19/1940   Cancelled Treatment:    Reason Eval/Treat Not Completed: Other (comment) (Treatment session attempted.  Patient currently eating breakfast; requests therapist re-attempt at later time as able.)   Aleah Ahlgrim H. Manson PasseyBrown, PT, DPT, NCS 03/07/16, 10:17 AM [phone]

## 2016-03-08 LAB — URINE CULTURE: Culture: >=100000 — AB

## 2016-03-08 LAB — BASIC METABOLIC PANEL
Anion gap: 7 (ref 5–15)
BUN: 38 mg/dL — AB (ref 6–20)
CHLORIDE: 116 mmol/L — AB (ref 101–111)
CO2: 18 mmol/L — ABNORMAL LOW (ref 22–32)
CREATININE: 1.12 mg/dL — AB (ref 0.44–1.00)
Calcium: 9.3 mg/dL (ref 8.9–10.3)
GFR calc Af Amer: 55 mL/min — ABNORMAL LOW (ref >60)
GFR calc non Af Amer: 48 mL/min — ABNORMAL LOW (ref >60)
GLUCOSE: 93 mg/dL (ref 65–99)
Potassium: 4.5 mmol/L (ref 3.5–5.1)
SODIUM: 141 mmol/L (ref 135–145)

## 2016-03-08 LAB — MAGNESIUM: MAGNESIUM: 1.9 mg/dL (ref 1.7–2.4)

## 2016-03-08 MED ORDER — SODIUM CHLORIDE 0.9 % IV SOLN
INTRAVENOUS | Status: AC
  Administered 2016-03-08 – 2016-03-09 (×2): via INTRAVENOUS

## 2016-03-08 MED ORDER — CEPHALEXIN 500 MG PO CAPS
500.0000 mg | ORAL_CAPSULE | Freq: Two times a day (BID) | ORAL | Status: DC
  Administered 2016-03-08 – 2016-03-10 (×5): 500 mg via ORAL
  Filled 2016-03-08 (×5): qty 1

## 2016-03-08 MED ORDER — POTASSIUM CHLORIDE CRYS ER 20 MEQ PO TBCR
20.0000 meq | EXTENDED_RELEASE_TABLET | Freq: Every day | ORAL | Status: DC
  Administered 2016-03-09 – 2016-03-10 (×2): 20 meq via ORAL
  Filled 2016-03-08 (×2): qty 1

## 2016-03-08 NOTE — Progress Notes (Addendum)
[facility] - [place] at [facility]   PATIENT NAME: Sally Curry    MR#:  [id number]  DATE OF BIRTH:  [birth year]  SUBJECTIVE:   . Patient complained abdominal pain and diarrhea 3 times with loose stools this morning.  REVIEW OF SYSTEMS:    Review of Systems  Constitutional: Negative for chills and fever.  HENT: Negative for congestion and tinnitus.   Eyes: Negative for blurred vision and double vision.  Respiratory: Negative for cough, shortness of breath and wheezing.   Cardiovascular: Negative for chest pain, orthopnea and PND.  Gastrointestinal: Positive for abdominal pain and diarrhea. Negative for blood in stool, melena, nausea and vomiting.  Genitourinary: Negative for dysuria and hematuria.  Neurological: Positive for weakness (generalized. ). Negative for dizziness, sensory change and focal weakness.  All other systems reviewed and are negative.   Nutrition: Heart healthy Tolerating Diet: Yes Tolerating PT:  Await Eval     DRUG ALLERGIES:  No Known Allergies  VITALS:  Blood pressure (!) 135/40, pulse (!) 54, temperature 98.2 F (36.8 C), temperature source Oral, resp. rate 18, height 5' (1.524 m), weight 145 lb 9.6 oz (66 kg), SpO2 98 %.  PHYSICAL EXAMINATION:   Physical Exam  GENERAL:  75 y.o.-year-old patient lying in the bed in no acute distress.  EYES: Pupils equal, round, reactive to light and accommodation. No scleral icterus. Extraocular muscles intact.  HEENT: Head atraumatic, normocephalic. Oropharynx and nasopharynx clear.  NECK:  Supple, no jugular venous distention. No thyroid enlargement, no tenderness.  LUNGS: Normal breath sounds bilaterally, no wheezing, rales, rhonchi. No use of accessory muscles of respiration.  CARDIOVASCULAR: S1, S2 normal. II/VI SEM at RSB, No rubs, or gallops.  ABDOMEN: Soft, tenderness, nondistended. Bowel sounds present. No organomegaly or mass.  EXTREMITIES: No cyanosis, clubbing or edema b/l.     NEUROLOGIC: Cranial nerves II through XII are intact. No focal Motor or sensory deficits b/l.  PSYCHIATRIC: The patient is alert and oriented x 3.  SKIN: No obvious rash, lesion, or ulcer.    LABORATORY PANEL:   CBC  Recent Labs Lab 03/06/16 0424  WBC 6.2  HGB 11.2*  HCT 32.8*  PLT 170   ------------------------------------------------------------------------------------------------------------------  Chemistries   Recent Labs Lab 03/05/16 1017  03/08/16 0741  NA 140  < > 141  K 3.9  < > 4.5  CL 111  < > 116*  CO2 19*  < > 18*  GLUCOSE 127*  < > 93  BUN 39*  < > 38*  CREATININE 1.24*  < > 1.12*  CALCIUM 9.3  < > 9.3  MG 1.9  --  1.9  AST 26  --   --   ALT 11*  --   --   ALKPHOS 91  --   --   BILITOT 1.0  --   --   < > = values in this interval not displayed. ------------------------------------------------------------------------------------------------------------------  Cardiac Enzymes  Recent Labs Lab 03/05/16 1017  TROPONINI <0.03   ------------------------------------------------------------------------------------------------------------------  RADIOLOGY:  No results found.   ASSESSMENT AND PLAN:   75 year old female with past medical history of peripheral vascular disease, history of previous CVA, glaucoma, dementia, CHF, coronary artery disease, history of previous UTI who presented to the hospital after a fall and being found down on the floor by her daughter and also noted to have urinary tract infection.   1. Status post fall-suspected to be a mechanical fall. -Patient's trauma workup including CT head, cervical spine are essentially normal. -  seen by PT and they recommend Home health PT and will arrange prior to discharge.   2. Urinary tract infection-discontinue ciprofloxacin, change to keflex po for 3 days. - urine cultures growing E.coli, resistant to cipro.   3. C. Diff diarrhea - positive C. Diff. Test - this is recurrent c. Diff as  pt. Had an episode 3 months back started on Oral Vanc.   4. Altered mental status-metabolic encephalopathy secondary to the UTI.  - improved and back to baseline.  5. Essential hypertension-continue Norvasc, Imdur, Toprol.  6. GERD-continue Protonix.  7. History of gout-continue allopurinol. No acute attack.  8. Depression-continue Lexapro.  Dehydration. IV fluid support. Follow-up BMP.  All the records are reviewed and case discussed with Care Management/Social Worker. Management plans discussed with the patient, family and they are in agreement.  CODE STATUS: Full  DVT Prophylaxis: Lovenox  TOTAL TIME TAKING CARE OF THIS PATIENT: 37 minutes.   POSSIBLE D/C IN 1-2 DAYS, DEPENDING ON CLINICAL CONDITION.   Shaune Pollackhen, Jasmeen Fritsch M.D on 03/08/2016 at 3:03 PM  Between 7am to 6pm - Pager - [phone]  After 6pm go to [email or web address] - Social research officer, governmentpassword [email or web address]  [facility]  Office  [phone]  CC: Primary care physician; Leotis ShamesSingh,Jasmine, MD

## 2016-03-08 NOTE — Care Management Important Message (Signed)
Important Message  Patient Details  Name: Sarabella Pressey MRN: [id number] Date of Birth: Dec 18, 1940   Medicare Important Message Given:  Yes    Gwenette GreetBrenda S Don Giarrusso, RN 03/08/2016, 9:34 AM

## 2016-03-08 NOTE — Clinical Social Work Note (Signed)
Clinical Social Work Assessment  Patient Details  Name: Sally Curry MRN: [id number] Date of Birth: January 30, 1941  Date of referral:  03/08/16               Reason for consult:  Discharge Planning                Permission sought to share information with:  Family Supports Permission granted to share information::  Yes, Verbal Permission Granted  Name::        Agency::     Relationship::   Roselyn Reef- Daughter/ Legal Guardian)  Contact Information:     Housing/Transportation Living arrangements for the past 2 months:  [place] of Information:  Patient, Adult Children Roselyn Reef- Daughter/ Legal Guardian) Patient Interpreter Needed:  None Criminal Activity/Legal Involvement Pertinent to Current Situation/Hospitalization:  No - Comment as needed Significant Relationships:  Adult Children, Other Family Members Therapist, art- Daughter/ Legal Guardian) Lives with:  Adult Children Roselyn Reef- Daughter/ Legal Guardian) Do you feel safe going back to the place where you live?  Yes Need for family participation in patient care:  Yes (Comment) Roselyn Reef- Daughter/ Legal Guardian)  Care giving concerns:  PT recommends that patient will benefit from SNF placement.    Social Worker assessment / plan:  CSW met with patient at bedside. Patient is not oriented. Patient stated she wants to go home. Granted CSW verbal permission to speak to her daughter. CSW spoke to patient's daughter over the phone. Per patient's daughter he phone is "Funny acting" and will not allow her to receive voicemails. Stated that she can receive text messages. Stated the number on the chart is the best contact number. Per patient's daughter she's the legal guardian. Reported that patient lives at home with her. Stated that she's interested in patient going to STR at discharge. Preference [employer] Granted CSW verbal permission to send SNF referrals to SNFs in [place]. FL2/ PASRR completed and faxed to SNFs in [facility] via [place]. Awaiting bed offers. CSW will continue to follow and assist.   Employment status:  Retired Forensic scientist:  Medicare PT Recommendations:  [place] / Referral to community resources:  [place]  Patient/Family's Response to care:  Patient's daughter in agreement for STR placement.   Patient/Family's Understanding of and Emotional Response to Diagnosis, Current Treatment, and Prognosis:  Patient's daughter reported she had concerns about CDIFF she wanted to discuss. Thanked CSW for assistance.   Emotional Assessment Appearance:  Appears stated age Attitude/Demeanor/Rapport:   (None) Affect (typically observed):  Calm Orientation:  Oriented to Self Alcohol / Substance use:  Not Applicable Psych involvement (Current and /or in the community):  No (Comment)  Discharge Needs  Concerns to be addressed:  Discharge Planning Concerns Readmission within the last 30 days:  No Current discharge risk:  Chronically ill Barriers to Discharge:  Continued Medical Work up   [employer], [place] 03/08/2016, 2:55 PM

## 2016-03-08 NOTE — [place] FL2 (Signed)
[place] MEDICAID FL2 LEVEL OF CARE SCREENING TOOL     IDENTIFICATION  Patient Name: Sally Curry Birthdate: May 19, 1941 Sex: female Admission Date (Current Location): 03/05/2016  [place] and [place] Number:  Chiropodist and Address:  [facility], [address] [place], [place] [address]      Provider Number: [id number]  Attending Physician Name and Address:  Shaune Pollack, MD  Relative Name and Phone Number:       Current Level of Care: Hospital Recommended Level of Care: Skilled Nursing Facility Prior Approval Number:    Date Approved/Denied:   PASRR Number:  [id number])  Discharge Plan: SNF    Current Diagnoses: Patient Active Problem List   Diagnosis Date Noted  . Failure to thrive in adult 03/05/2016  . Altered mental status 03/05/2016  . Diarrhea 12/19/2015  . Malignant essential hypertension 11/25/2015  . Generalized weakness 11/25/2015  . Sepsis (HCC) 11/25/2015  . Leukocytosis 11/25/2015  . Nausea and vomiting 11/25/2015  . Acute on chronic renal failure (HCC) 11/25/2015  . Dehydration 11/25/2015  . History of CVA with residual deficit 10/04/2015  . CHF (congestive heart failure) (HCC) 09/22/2015  . UTI (lower urinary tract infection) 06/04/2015  . Duodenal ulcer with hemorrhage 01/20/2014  . CAD (coronary artery disease), native coronary artery 01/20/2014  . Essential hypertension, benign 01/20/2014  . Dyslipidemia 01/20/2014  . Hypokalemia 01/20/2014  . Glaucoma 01/20/2014  . Depression with anxiety 01/20/2014    Orientation RESPIRATION BLADDER Height & Weight     Self, Place  Normal Incontinent Weight: 145 lb 9.6 oz (66 kg) Height:  5' (152.4 cm)  BEHAVIORAL SYMPTOMS/MOOD NEUROLOGICAL BOWEL NUTRITION STATUS   (None)  (NOne) Incontinent Diet (Heart )  AMBULATORY STATUS COMMUNICATION OF NEEDS Skin   Extensive Assist Verbally Other (Comment) (Laceration- Right Elbow)                       Personal  Care Assistance Level of Assistance  Bathing, Feeding, Dressing Bathing Assistance: Limited assistance Feeding assistance: Independent Dressing Assistance: Limited assistance     Functional Limitations Info  Hearing, Sight, Speech Sight Info: Adequate Hearing Info: Adequate Speech Info: Adequate    SPECIAL CARE FACTORS FREQUENCY  PT (By licensed PT)     PT Frequency:  (5)              Contractures      Additional Factors Info  Code Status, Allergies, Isolation Precautions Code Status Info:  (Full Code) Allergies Info:  (No Known Allergies)     Isolation Precautions Info:  (Enteric precautions (UV disinfection))     Current Medications (03/08/2016):  This is the current hospital active medication list Current Facility-Administered Medications  Medication Dose Route Frequency Provider Last Rate Last Dose  . acetaminophen (TYLENOL) tablet 650 mg  650 mg Oral Q6H PRN Alexis Hugelmeyer, DO   650 mg at 03/06/16 1947   Or  . acetaminophen (TYLENOL) suppository 650 mg  650 mg Rectal Q6H PRN Alexis Hugelmeyer, DO      . acidophilus (RISAQUAD) capsule 1 capsule  1 capsule Oral Daily Houston Siren, MD   1 capsule at 03/08/16 0849  . allopurinol (ZYLOPRIM) tablet 100 mg  100 mg Oral Daily Alexis Hugelmeyer, DO   100 mg at 03/08/16 0850  . amLODipine (NORVASC) tablet 2.5 mg  2.5 mg Oral Daily Sharyn Creamer, MD   2.5 mg at 03/08/16 0850  . aspirin EC tablet 81 mg  81 mg  Oral Daily Alexis Hugelmeyer, DO   81 mg at 03/08/16 0855  . bisacodyl (DULCOLAX) EC tablet 5 mg  5 mg Oral Daily PRN Alexis Hugelmeyer, DO      . cephALEXin (KEFLEX) capsule 500 mg  500 mg Oral Q12H Shaune PollackQing Chen, MD   500 mg at 03/08/16 1306  . cholecalciferol (VITAMIN D) tablet 1,000 Units  1,000 Units Oral Daily Alexis Hugelmeyer, DO   1,000 Units at 03/08/16 0849  . clopidogrel (PLAVIX) tablet 75 mg  75 mg Oral Daily Alexis Hugelmeyer, DO   75 mg at 03/08/16 0849  . enoxaparin (LOVENOX) injection 40 mg  40 mg  Subcutaneous Q24H Alexis Hugelmeyer, DO   40 mg at 03/07/16 2033  . escitalopram (LEXAPRO) tablet 10 mg  10 mg Oral Daily Alexis Hugelmeyer, DO   10 mg at 03/08/16 0849  . folic acid (FOLVITE) tablet 1 mg  1,000 mcg Oral Daily Alexis Hugelmeyer, DO   1 mg at 03/08/16 0849  . furosemide (LASIX) tablet 20 mg  20 mg Oral Daily Alexis Hugelmeyer, DO   20 mg at 03/08/16 0850  . gemfibrozil (LOPID) tablet 600 mg  600 mg Oral BID AC Alexis Hugelmeyer, DO   600 mg at 03/08/16 0850  . hydrALAZINE (APRESOLINE) injection 10 mg  10 mg Intravenous Q4H PRN Oralia Manisavid Willis, MD   10 mg at 03/08/16 0535  . HYDROcodone-acetaminophen (NORCO/VICODIN) 5-325 MG per tablet 1-2 tablet  1-2 tablet Oral Q4H PRN Alexis Hugelmeyer, DO   2 tablet at 03/08/16 1306  . magnesium citrate solution 1 Bottle  1 Bottle Oral Once PRN Alexis Hugelmeyer, DO      . metoprolol succinate (TOPROL-XL) 24 hr tablet 50 mg  50 mg Oral Daily Sharyn CreamerMark Quale, MD   50 mg at 03/08/16 0849  . multivitamin with minerals tablet 1 tablet  1 tablet Oral Daily Alexis Hugelmeyer, DO   1 tablet at 03/08/16 0849  . nitroGLYCERIN (NITROGLYN) 2 % ointment 0.5 inch  0.5 inch Topical Q6H PRN Houston SirenVivek J Sainani, MD      . nystatin cream (MYCOSTATIN) 1 application  1 application Topical BID Alexis Hugelmeyer, DO   1 application at 03/08/16 0850  . ondansetron (ZOFRAN) tablet 4 mg  4 mg Oral Q8H PRN Alexis Hugelmeyer, DO      . pilocarpine (PILOCAR) 1 % ophthalmic solution 1 drop  1 drop Both Eyes QID Alexis Hugelmeyer, DO   1 drop at 03/08/16 1307  . potassium chloride SA (K-DUR,KLOR-CON) CR tablet 20 mEq  20 mEq Oral BID Alexis Hugelmeyer, DO   20 mEq at 03/08/16 0849  . pravastatin (PRAVACHOL) tablet 40 mg  40 mg Oral q1800 Alexis Hugelmeyer, DO   40 mg at 03/07/16 1806  . senna-docusate (Senokot-S) tablet 1 tablet  1 tablet Oral QHS PRN Alexis Hugelmeyer, DO      . vancomycin (VANCOCIN) 50 mg/mL oral solution 125 mg  125 mg Oral Q6H Houston SirenVivek J Sainani, MD   125 mg at 03/08/16  1306  . zolpidem (AMBIEN) tablet 5 mg  5 mg Oral QHS PRN Alexis Hugelmeyer, DO   5 mg at 03/07/16 2033     Discharge Medications: Please see discharge summary for a list of discharge medications.  Relevant Imaging Results:  Relevant Lab Results:   Additional Information SSN [id number]  Verta EllenChristina E Zyir Gassert, LCSW

## 2016-03-08 NOTE — Progress Notes (Addendum)
Physical therapy tried calling Pt's daughter Dickey GaveJamie Lee at [phone] at the number provided in the system. There was no answer and no voicemail/ answering set up. The patient does not know the daughter's cell phone number.

## 2016-03-08 NOTE — Progress Notes (Signed)
CSW attempted to call patient's daughter/ Legal Guardain to discuss PT recommendations. Left voicemail. Awaiting phone call back. CSW will continue to follow and assist.  Woodroe Modehristina Marja Adderley, MSW, LCSW, LCAS-A Clinical Social Worker [phone]

## 2016-03-08 NOTE — Progress Notes (Signed)
Physical Therapy Treatment Patient Details Name: Sally Curry MRN: [id number] DOB: December 17, 1940 Today's Date: 03/08/2016    History of Present Illness presented to ER status post fall, AMS; admitted with UTI, failure to thrive.  CTH negative for acute injury; all orthopedic imaging (cervical, R tib/fib, femur, humerus, shoulder and pelvis) negative for acute osseous injury.    PT Comments    Pt initially declining physical therapy but then agrees to get OOB to chair.  She refused ambulating in room/hallway multiple times due to reports of abdominal pain and fatigue.  PT attempted to see pt x2 yesterday and she declined therapy on both attempts.  Pt is demonstrating a decline in mobility since PT evaluation and now requires mod assist for bed mobility and sit<>stand.  Recommendations have been updated to SNF at d/c given pt's current mobility status and the fact that the pt is alone at home during the day.  SW and RN notified of updated recommendations.   Follow Up Recommendations  SNF     Equipment Recommendations  Other (comment) (TBD at next venue of care)    Recommendations for Other Services OT consult     Precautions / Restrictions Precautions Precautions: Fall Precaution Comments: PPM Restrictions Weight Bearing Restrictions: No    Mobility  Bed Mobility Overal bed mobility: Needs Assistance Bed Mobility: Rolling;Sidelying to Sit Rolling: Min assist Sidelying to sit: Mod assist;HOB elevated       General bed mobility comments: Pt requires cues and assist for log roll as pt reports chronic abdominal pain.  Mod assist to boost from sidelying to sitting.  Transfers Overall transfer level: Needs assistance Equipment used: Rolling walker (2 wheeled) Transfers: Sit to/from [employer] Sit to Stand: Mod assist Stand pivot transfers: Min guard       General transfer comment: Cues for hand placement and technique.  Pt requires mod assist to boost to  standing.  Once standing was able to pivot to chair with min guard assist.   Ambulation/Gait             General Gait Details: Pt declined ambulating in room/hallway multiple times   Stairs            Wheelchair Mobility    Modified Rankin (Stroke Patients Only)       Balance Overall balance assessment: Needs assistance Sitting-balance support: No upper extremity supported;Feet supported Sitting balance-Leahy Scale: Fair     Standing balance support: Bilateral upper extremity supported;During functional activity Standing balance-Leahy Scale: Poor Standing balance comment: Relies on RW for support                    Cognition Arousal/Alertness: Awake/alert Behavior During Therapy: WFL for tasks assessed/performed Overall Cognitive Status: No family/caregiver present to determine baseline cognitive functioning       Memory: Decreased short-term memory              Exercises General Exercises - Lower Extremity Ankle Circles/Pumps: AROM;Both;10 reps;Seated Long Arc Quad: AROM;Both;5 reps;Seated Hip Flexion/Marching: Both;10 reps;Seated    General Comments General comments (skin integrity, edema, etc.): Pt is TTP over R chest region but pt is unable to recall mode of injury.  Likely from fall as pt TTP.      Pertinent Vitals/Pain Pain Assessment: Faces Faces Pain Scale: Hurts even more Pain Location: stomach, chest Pain Descriptors / Indicators: Sore;Tender Pain Intervention(s): Limited activity within patient's tolerance;Monitored during session;Repositioned    Home Living  Prior Function            PT Goals (current goals can now be found in the care plan section) Acute Rehab PT Goals Patient Stated Goal: none stated PT Goal Formulation: With patient Time For Goal Achievement: 03/20/16 Potential to Achieve Goals: Fair Progress towards PT goals: Not progressing toward goals - comment (due to reports of  abdominal pain and fatigue)    Frequency    Min 2X/week      PT Plan Discharge plan needs to be updated    Co-evaluation             End of Session Equipment Utilized During Treatment: Gait belt Activity Tolerance: Patient limited by pain;Patient limited by fatigue Patient left: in chair;with call bell/phone within reach;with chair alarm set;with nursing/sitter in room     Time: 1123-1141 PT Time Calculation (min) (ACUTE ONLY): 18 min  Charges:  $Therapeutic Activity: 8-22 mins                    G Codes:      Encarnacion ChuAshley Cicley Ganesh PT, DPT 03/08/2016, 1:39 PM

## 2016-03-09 LAB — BASIC METABOLIC PANEL
Anion gap: 7 (ref 5–15)
BUN: 42 mg/dL — AB (ref 6–20)
CALCIUM: 9.1 mg/dL (ref 8.9–10.3)
CO2: 20 mmol/L — ABNORMAL LOW (ref 22–32)
Chloride: 115 mmol/L — ABNORMAL HIGH (ref 101–111)
Creatinine, Ser: 1.35 mg/dL — ABNORMAL HIGH (ref 0.44–1.00)
GFR calc Af Amer: 44 mL/min — ABNORMAL LOW (ref >60)
GFR, EST NON AFRICAN AMERICAN: 38 mL/min — AB (ref >60)
GLUCOSE: 94 mg/dL (ref 65–99)
Potassium: 4.4 mmol/L (ref 3.5–5.1)
SODIUM: 142 mmol/L (ref 135–145)

## 2016-03-09 NOTE — Progress Notes (Signed)
Physical Therapy Treatment Patient Details Name: Nash DimmerDawn Rex MRN: [id number] DOB: [birth year] Today's Date: 03/09/2016    History of Present Illness presented to ER status post fall, AMS; admitted with UTI, failure to thrive.  CTH negative for acute injury; all orthopedic imaging (cervical, R tib/fib, femur, humerus, shoulder and pelvis) negative for acute osseous injury.    PT Comments    Ms. Garth SchlatterBaughman was willing to work with therapy and is slowly making modest progress toward therapy goals. She is limited by generalized fatigue and pain likely due to fall PTA. She required max assist for bed mobility and sit>stand from bed.  She tolerated therapeutic exercises well, although demonstrating increased tone RLE/UE due to h/o CVA.  Pt will benefit from continued skilled PT services to increase functional independence and safety.   Follow Up Recommendations  SNF     Equipment Recommendations  Other (comment) (TBD at next venue of care)    Recommendations for Other Services OT consult     Precautions / Restrictions Precautions Precautions: Fall Precaution Comments: PPM Restrictions Weight Bearing Restrictions: No    Mobility  Bed Mobility Overal bed mobility: Needs Assistance Bed Mobility: Rolling;Sidelying to Sit Rolling: Min assist Sidelying to sit: HOB elevated;Max assist       General bed mobility comments: Min assist to roll onto R side, pt reports pain in R chest when pulling on bed rail.  Max assist to boost up to sitting from R sidelying due to fatigue and weakness.  Assist using bed pad to scoot to EOB.  Transfers Overall transfer level: Needs assistance Equipment used: Rolling walker (2 wheeled) Transfers: Sit to/from [employer];Stand Pivot Transfers Sit to Stand: Max assist Stand pivot transfers: Min guard       General transfer comment: Assist to boost to standing with pt pulling PT despite cues for hand placement.  Min guard to pivot to  [facility].  Ambulation/Gait Ambulation/Gait assistance: Min guard Ambulation Distance (Feet): 3 Feet (from [facility] to chair) Assistive device: Rolling walker (2 wheeled) Gait Pattern/deviations: Shuffle;Trunk flexed;Narrow base of support Gait velocity: decreased   General Gait Details: Pt declined ambulating in room/hallway multiple times.  Took a few steps from [facility] to chair with flexed posture and directional cues.   Stairs            Wheelchair Mobility    Modified Rankin (Stroke Patients Only)       Balance Overall balance assessment: Needs assistance;History of Falls Sitting-balance support: Feet supported;Single extremity supported Sitting balance-Leahy Scale: Fair Sitting balance - Comments: Posterior bias but able to maintain upright with UE support   Standing balance support: Bilateral upper extremity supported;During functional activity Standing balance-Leahy Scale: Poor Standing balance comment: Relies on UE support to maintain static balance                    Cognition Arousal/Alertness: Awake/alert Behavior During Therapy: WFL for tasks assessed/performed Overall Cognitive Status: Within Functional Limits for tasks assessed                      Exercises General Exercises - Upper Extremity Shoulder Flexion: AROM;Both;10 reps;Seated Elbow Flexion: AROM;Both;10 reps;Seated Elbow Extension: AROM;Both;10 reps;Seated General Exercises - Lower Extremity Ankle Circles/Pumps: AROM;Both;10 reps;Seated (limited RLE due to increased tone) Long Arc Quad: AROM;Both;Seated;10 reps Hip ABduction/ADduction: AROM;AAROM;Left;Right;10 reps;Seated (limited RLE due to increased tone) Straight Leg Raises: AAROM;AROM;Left;Right;10 reps;Seated (limited RLE due to increased tone) Hip Flexion/Marching: Both;10 reps;Seated Other Exercises Other Exercises: Cervical AROM x10 into  F, E, L rotation, R rotation    General Comments        Pertinent Vitals/Pain Pain  Assessment: Faces Faces Pain Scale: Hurts even more Pain Location: stomach, R chest, BLE Pain Descriptors / Indicators: Grimacing;Guarding Pain Intervention(s): Monitored during session;Limited activity within patient's tolerance;Repositioned;Other (comment) (MD made aware)    Home Living                      Prior Function            PT Goals (current goals can now be found in the care plan section) Acute Rehab PT Goals Patient Stated Goal: to feel better PT Goal Formulation: With patient Time For Goal Achievement: 03/20/16 Potential to Achieve Goals: Fair Progress towards PT goals: Progressing toward goals (very modestly)    Frequency    Min 2X/week      PT Plan Current plan remains appropriate    Co-evaluation             End of Session Equipment Utilized During Treatment: Gait belt Activity Tolerance: Patient limited by pain;Patient limited by fatigue Patient left: in chair;with call bell/phone within reach;with chair alarm set     Time: 1610-9604 PT Time Calculation (min) (ACUTE ONLY): 39 min  Charges:  $Therapeutic Exercise: 23-37 mins $Therapeutic Activity: 8-22 mins                    G Codes:      Encarnacion Chu PT, DPT 03/09/2016, 11:38 AM

## 2016-03-09 NOTE — Progress Notes (Addendum)
[facility] - [place] [facility]   PATIENT NAME: Sally DimmerDawn Curry    MR#:  [id number]  DATE OF BIRTH:  1941-03-06  SUBJECTIVE:   Patient still complained abdominal pain, diarrhea with loose stools once this morning. Right side chest pain with movement and breath.  REVIEW OF SYSTEMS:    Review of Systems  Constitutional: Negative for chills and fever.  HENT: Negative for congestion and tinnitus.   Eyes: Negative for blurred vision and double vision.  Respiratory: Negative for cough, shortness of breath and wheezing.   Cardiovascular: Negative for chest pain, orthopnea and PND.  Gastrointestinal: Positive for abdominal pain and diarrhea. Negative for blood in stool, melena, nausea and vomiting.  Genitourinary: Negative for dysuria and hematuria.  Neurological: Positive for weakness (generalized. ). Negative for dizziness, sensory change and focal weakness.  All other systems reviewed and are negative.   Nutrition: Heart healthy Tolerating Diet: Yes Tolerating PT:  Await Eval     DRUG ALLERGIES:  No Known Allergies  VITALS:  Blood pressure (!) 159/51, pulse (!) 59, temperature 98.1 F (36.7 C), temperature source Oral, resp. rate 20, height 5' (1.524 m), weight 145 lb 9.6 oz (66 kg), SpO2 95 %.  PHYSICAL EXAMINATION:   Physical Exam  GENERAL:  75 y.o.-year-old patient lying in the bed in no acute distress.  EYES: Pupils equal, round, reactive to light and accommodation. No scleral icterus. Extraocular muscles intact.  HEENT: Head atraumatic, normocephalic. Oropharynx and nasopharynx clear.  NECK:  Supple, no jugular venous distention. No thyroid enlargement, no tenderness.  LUNGS: Normal breath sounds bilaterally, no wheezing, rales, rhonchi. No use of accessory muscles of respiration. Tenderness of chest wall on right side on palpation. CARDIOVASCULAR: S1, S2 normal. II/VI SEM at RSB, No rubs, or gallops.  ABDOMEN: Soft, tenderness, nondistended. Bowel  sounds present. No organomegaly or mass.  EXTREMITIES: No cyanosis, clubbing or edema b/l.    NEUROLOGIC: Cranial nerves II through XII are intact. No focal Motor or sensory deficits b/l.  PSYCHIATRIC: The patient is alert and oriented x 3.  SKIN: No obvious rash, lesion, or ulcer.    LABORATORY PANEL:   CBC  Recent Labs Lab 03/06/16 0424  WBC 6.2  HGB 11.2*  HCT 32.8*  PLT 170   ------------------------------------------------------------------------------------------------------------------  Chemistries   Recent Labs Lab 03/05/16 1017  03/08/16 0741 03/09/16 0329  NA 140  < > 141 142  K 3.9  < > 4.5 4.4  CL 111  < > 116* 115*  CO2 19*  < > 18* 20*  GLUCOSE 127*  < > 93 94  BUN 39*  < > 38* 42*  CREATININE 1.24*  < > 1.12* 1.35*  CALCIUM 9.3  < > 9.3 9.1  MG 1.9  --  1.9  --   AST 26  --   --   --   ALT 11*  --   --   --   ALKPHOS 91  --   --   --   BILITOT 1.0  --   --   --   < > = values in this interval not displayed. ------------------------------------------------------------------------------------------------------------------  Cardiac Enzymes  Recent Labs Lab 03/05/16 1017  TROPONINI <0.03   ------------------------------------------------------------------------------------------------------------------  RADIOLOGY:  No results found.   ASSESSMENT AND PLAN:   75 year old female with past medical history of peripheral vascular disease, history of previous CVA, glaucoma, dementia, CHF, coronary artery disease, history of previous UTI who presented to the hospital after a fall and  being found down on the floor by her daughter and also noted to have urinary tract infection.   1. Status post fall-suspected to be a mechanical fall. -Patient's trauma workup including CT head, cervical spine are essentially normal. PT reevaluation recommend SNF placement.   2. Urinary tract infection-discontinued ciprofloxacin, changed to keflex po for 2/3 days. -  urine cultures growing E.coli, resistant to cipro.   3. 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 Had an episode 3 months back started on Oral Vanc.   4. Altered mental status-metabolic encephalopathy secondary to the UTI.  - improved and back to baseline.  5. Essential hypertension-continue Norvasc, Imdur, Toprol.  6. GERD-continue Protonix.  7. History of gout-continue allopurinol. No acute attack.  8. Depression-continue Lexapro.  ARF with Dehydration. IV fluid support. Hold lasix. Follow-up BMP. Right side chest pain, due to musoskeletal etiology.   All the records are reviewed and case discussed with Care Management/Social Worker. Management plans discussed with the patient, family and they are in agreement.  CODE STATUS: Full  DVT Prophylaxis: Lovenox  TOTAL TIME TAKING CARE OF THIS PATIENT: 33 minutes.   POSSIBLE D/C to SNF IN 1-2 DAYS, DEPENDING ON CLINICAL CONDITION.   Shaune Pollack M.D on 03/09/2016 at 12:34 PM  Between 7am to 6pm - Pager - [phone]  After 6pm go to [email or web address] - Social research officer, government  [employer] [facility]  Office  [phone]  CC: Primary care physician; Leotis Shames, MD

## 2016-03-09 NOTE — Progress Notes (Signed)
CSW called Neysa [facility]risty- [facility]. Requested she reviewed patient's referral. Awaiting phone call back. CSW will continue to follow and assist.  Woodroe Modehristina Jasmyne Lodato, MSW, LCSW, LCAS-A Clinical Social Worker [phone]

## 2016-03-09 NOTE — Progress Notes (Signed)
CSW presented bed offers to patient's daughter. [facility]. CSW informed Neysa BonitoChristy- [facility]. Updated her on patient's status. CSW will continue to follow and assist.  Woodroe Modehristina Michaele Amundson, MSW, LCSW, LCAS-A Clinical Social Worker [phone]

## 2016-03-10 LAB — BASIC METABOLIC PANEL
ANION GAP: 5 (ref 5–15)
BUN: 40 mg/dL — AB (ref 6–20)
CHLORIDE: 116 mmol/L — AB (ref 101–111)
CO2: 20 mmol/L — ABNORMAL LOW (ref 22–32)
Calcium: 9.1 mg/dL (ref 8.9–10.3)
Creatinine, Ser: 1.08 mg/dL — ABNORMAL HIGH (ref 0.44–1.00)
GFR calc non Af Amer: 50 mL/min — ABNORMAL LOW (ref >60)
GFR, EST AFRICAN AMERICAN: 58 mL/min — AB (ref >60)
Glucose, Bld: 99 mg/dL (ref 65–99)
POTASSIUM: 4.3 mmol/L (ref 3.5–5.1)
SODIUM: 141 mmol/L (ref 135–145)

## 2016-03-10 MED ORDER — VANCOMYCIN 50 MG/ML ORAL SOLUTION: 125.0000 mg | Freq: Four times a day (QID) | ORAL | Status: DC

## 2016-03-10 MED ORDER — RISAQUAD PO CAPS: 1.0000 | ORAL_CAPSULE | Freq: Every day | ORAL | Status: AC

## 2016-03-10 NOTE — Progress Notes (Signed)
Clinical Social Worker was informed that patient will be medically ready to discharge to [facility]. Patient and her daughter in a agreement with plan. CSW called [place]y- [facility] to confirm that patient's bed is ready. Per Neysa Bonitohristy patient's bed will be ready after 3 PM Provided patient's room number 702 and number to call for report [phone] . All discharge information faxed to  [facility]. via HUB.   Call to patient's daughter, left message to inform her [facility] would [facility] RN will call report and patient will [facility] via EMS.  Woodroe Modehristina Chauna Osoria, MSW, LCSW, LCAS-A Clinical Social Worker [phone]

## 2016-03-10 NOTE — Care Management Important Message (Signed)
Important Message  Patient Details  Name: Sally Curry MRN: [id number] Date of Birth: 03-02-1941   Medicare Important Message Given:  Yes    Gwenette GreetBrenda S Maclean Foister, RN 03/10/2016, 8:15 AM

## 2016-03-10 NOTE — Clinical Social Work Placement (Signed)
   CLINICAL SOCIAL WORK PLACEMENT  NOTE  Date:  03/10/2016  Patient Details  Name: Sally Curry Teater MRN: [id number] Date of Birth: [birth year]  Clinical Social Work is seeking post-discharge placement for this patient at the Skilled  Nursing Facility level of care (*CSW will initial, date and re-position this form in  chart as items are completed):  Yes   Patient/family provided with [facility] Clinical Social Work Department's list of facilities offering this level of care within the geographic area requested by the patient (or if unable, by the patient's family).  Yes   Patient/family informed of their freedom to choose among providers that offer the needed level of care, that participate in Medicare, Medicaid or managed care program needed by the patient, have an available bed and are willing to accept the patient.  Yes   Patient/family informed of [facility]'s ownership interest in [facility] and [facility], as well as of the fact that they are under no obligation to receive care at these facilities.  PASRR submitted to EDS on 03/10/16     PASRR number received on 03/10/16     Existing PASRR number confirmed on       FL2 transmitted to all facilities in geographic area requested by pt/family on 03/10/16     FL2 transmitted to all facilities within larger geographic area on       Patient informed that his/her managed care company has contracts with or will negotiate with certain facilities, including the following:        Yes   Patient/family informed of bed offers received.  Patient chooses bed at  [facility]( [name]     Physician recommends and patient chooses bed at      Patient to be transferred to  [facility]( [name] on 03/10/16.  Patient to be transferred to facility by  (EMS)     Patient family notified on 03/10/16 of transfer.  Name of family member notified:   (Daughter)     PHYSICIAN       Additional Comment:     _______________________________________________ Idamae Lusherhristina E Hervey Wedig, LCSW 03/10/2016, 11:43 AM

## 2016-03-10 NOTE — Progress Notes (Addendum)
Daughter Jamie madAsher Muire aware that [facility], states understanding, also updated on pt status, in agreement with plans and no questions

## 2016-03-10 NOTE — Discharge Instructions (Signed)
Heart healthy diet. °Activity as tolerated. °Fall precaution. °

## 2016-03-10 NOTE — Progress Notes (Signed)
[facility]

## 2016-03-10 NOTE — Progress Notes (Signed)
Dr Imogene Burnhen made aware that pt with BP 147/40 HR 52-56 pt has metoprolol and norvasc due this morning, per Dr Imogene Burnhen ok to given medications to pt, pt baseline, pt normally runs low, ok to give

## 2016-03-10 NOTE — Discharge Summary (Signed)
[facility] [place] [facility]   PATIENT NAME: Sally Curry    MR#:  [id number]  DATE OF BIRTH:  10/23/1940  DATE OF ADMISSION:  03/05/2016   ADMITTING PHYSICIAN: Tonye Royalty, DO  DATE OF DISCHARGE: 03/10/2016 PRIMARY CARE PHYSICIAN: Singh,Jasmine, MD   ADMISSION DIAGNOSIS:  Contusion of right knee, initial encounter [S80.01XA] Fall, initial encounter [W19.XXXA] Skin tear of elbow without complication, right, initial encounter [S51.011A] Hematoma of right parietal scalp, initial encounter [S00.03XA] DISCHARGE DIAGNOSIS:  Active Problems:   Failure to thrive in adult   Altered mental status  C. Diff diarrhea UTI ARF with dehydration. SECONDARY DIAGNOSIS:   Past Medical History:  Diagnosis Date  . Anemia   . Anxiety   . CAD (coronary artery disease)   . CHF (congestive heart failure) (HCC)   . Chronic kidney disease   . Dementia   . Depression   . Glaucoma   . Gout   . Hypertension   . Presence of permanent cardiac pacemaker   . PVD (peripheral vascular disease) (HCC)   . Renal insufficiency   . Stroke [facility])    HOSPITAL COURSE:   75 year old female with past medical history of peripheral vascular disease, history of previous CVA, glaucoma, dementia, CHF, coronary artery disease, history of previous UTI who presented to the hospital after a fall and being found down on the floor by her daughter and also noted to have urinary tract infection.   1. Status post fall-suspected to be a mechanical fall. -Patient's trauma workup including CT head, cervical spine are essentially normal. PT reevaluation recommend SNF placement.   2. Urinary tract infection-discontinued ciprofloxacin, changed to keflex po and completed 3 days. - urine cultures growing E.coli, resistant to cipro.   3. 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 Had an episode 3 months back started on Oral Vanc. Diarrhea is better.   Vancomycin po for 10 more days.  4. Altered mental status-metabolic encephalopathy secondary to the UTI.  - improved and back to baseline.  5. Essential hypertension-discontinued Nor hydralazine, lasix and Imdur due to low BP,  Continue Toprol and norvasc.  6. GERD-on Protonix.  7. History of gout-continue allopurinol. No acute attack.  8. Depression-continue Lexapro.  ARF with Dehydration. Improving with IV fluid support. Hold lasix. Follow-up BMP as outpatient. Right side chest pain, due to musoskeletal etiology.   DISCHARGE CONDITIONS:  Stable, discharge to SNF today. CONSULTS OBTAINED:   DRUG ALLERGIES:  No Known Allergies DISCHARGE MEDICATIONS:     Medication List    STOP taking these medications   furosemide 20 MG tablet Commonly known as:  LASIX   hydrALAZINE 100 MG tablet Commonly known as:  APRESOLINE   isosorbide mononitrate 60 MG 24 hr tablet Commonly known as:  IMDUR   potassium chloride SA 20 MEQ tablet Commonly known as:  K-DUR,KLOR-CON   traMADol 50 MG tablet Commonly known as:  ULTRAM     TAKE these medications   acidophilus Caps capsule Take 1 capsule by mouth daily.   allopurinol 100 MG tablet Commonly known as:  ZYLOPRIM Take 100 mg by mouth daily.   amLODipine 2.5 MG tablet Commonly known as:  NORVASC Take 1 tablet (2.5 mg total) by mouth daily.   aspirin EC 81 MG tablet Take 81 mg by mouth daily.   cholecalciferol 1000 units tablet Commonly known as:  VITAMIN D Take 1 tablet (1,000 Units total) by mouth daily.  clopidogrel 75 MG tablet Commonly known as:  PLAVIX Take 75 mg by mouth daily.   escitalopram 10 MG tablet Commonly known as:  LEXAPRO Take 10 mg by mouth daily.   folic acid 1 MG tablet Commonly known as:  FOLVITE Take 1 tablet (1 mg total) by mouth daily.   gemfibrozil 600 MG tablet Commonly known as:  LOPID Take 600 mg by mouth 2 (two) times daily before a meal.   lovastatin 40 MG tablet Commonly  known as:  MEVACOR Take 40 mg by mouth every evening.   metoprolol succinate 50 MG 24 hr tablet Commonly known as:  TOPROL-XL Take 50 mg by mouth daily.   multivitamin with minerals Tabs tablet Take 1 tablet by mouth daily.   nystatin cream Commonly known as:  MYCOSTATIN Apply 1 application topically 2 (two) times daily as needed for rash.   omeprazole 40 MG capsule Commonly known as:  PRILOSEC Take 40 mg by mouth daily.   ondansetron 4 MG tablet Commonly known as:  ZOFRAN Take 4 mg by mouth every 8 (eight) hours as needed for nausea or vomiting.   pilocarpine 1 % ophthalmic solution Commonly known as:  PILOCAR Place 1 drop into both eyes 4 (four) times daily.   vancomycin 50 mg/mL oral solution Commonly known as:  VANCOCIN Take 2.5 mLs (125 mg total) by mouth every 6 (six) hours.        DISCHARGE INSTRUCTIONS:   DIET:  Heart healthy diet. DISCHARGE CONDITION:  Stable. ACTIVITY:  As tolerated. If you experience worsening of your admission symptoms, develop shortness of breath, life threatening emergency, suicidal or homicidal thoughts you must seek medical attention immediately by calling 911 or calling your MD immediately  if symptoms less severe.  You Must read complete instructions/literature along with all the possible adverse reactions/side effects for all the Medicines you take and that have been prescribed to you. Take any new Medicines after you have completely understood and accpet all the possible adverse reactions/side effects.   Please note  You were cared for by a hospitalist during your hospital stay. If you have any questions about your discharge medications or the care you received while you were in the hospital after you are discharged, you can call the unit and asked to speak with the hospitalist on call if the hospitalist that took care of you is not available. Once you are discharged, your primary care physician will handle any further medical  issues. Please note that NO REFILLS for any discharge medications will be authorized once you are discharged, as it is imperative that you return to your primary care physician (or establish a relationship with a primary care physician if you do not have one) for your aftercare needs so that they can reassess your need for medications and monitor your lab values.    On the day of Discharge:  VITAL SIGNS:  Blood pressure (!) 109/54, pulse 61, temperature 98.4 F (36.9 C), temperature source Oral, resp. rate 17, height 5' (1.524 m), weight 145 lb 9.6 oz (66 kg), SpO2 98 %. PHYSICAL EXAMINATION:  GENERAL:  75 y.o.-year-old patient lying in the bed with no acute distress.  EYES: Pupils equal, round, reactive to light and accommodation. No scleral icterus. Extraocular muscles intact.  HEENT: Head atraumatic, normocephalic. Oropharynx and nasopharynx clear.  NECK:  Supple, no jugular venous distention. No thyroid enlargement, no tenderness.  LUNGS: Normal breath sounds bilaterally, no wheezing, rales,rhonchi or crepitation. No use of accessory muscles of respiration.  CARDIOVASCULAR: S1, S2 normal. No murmurs, rubs, or gallops.  ABDOMEN: Soft, non-tender, non-distended. Bowel sounds present. No organomegaly or mass.  EXTREMITIES: No pedal edema, cyanosis, or clubbing.  NEUROLOGIC: Cranial nerves II through XII are intact. Muscle strength 3-4/5 in all extremities. Sensation intact. Gait not checked.  PSYCHIATRIC: The patient is alert and oriented x 3.  SKIN: No obvious rash, lesion, or ulcer.  DATA REVIEW:   CBC  Recent Labs Lab 03/06/16 0424  WBC 6.2  HGB 11.2*  HCT 32.8*  PLT 170    Chemistries   Recent Labs Lab 03/05/16 1017  03/08/16 0741  03/10/16 0341  NA 140  < > 141  < > 141  K 3.9  < > 4.5  < > 4.3  CL 111  < > 116*  < > 116*  CO2 19*  < > 18*  < > 20*  GLUCOSE 127*  < > 93  < > 99  BUN 39*  < > 38*  < > 40*  CREATININE 1.24*  < > 1.12*  < > 1.08*  CALCIUM 9.3  < >  9.3  < > 9.1  MG 1.9  --  1.9  --   --   AST 26  --   --   --   --   ALT 11*  --   --   --   --   ALKPHOS 91  --   --   --   --   BILITOT 1.0  --   --   --   --   < > = values in this interval not displayed.   Microbiology Results  Results for orders placed or performed during the hospital encounter of 03/05/16  Urine culture     Status: Abnormal   Collection Time: 03/05/16 12:15 PM  Result Value Ref Range Status   Specimen Description URINE, CLEAN CATCH  Final   Special Requests NONE  Final   Culture >=100,000 COLONIES/mL ESCHERICHIA COLI (A)  Final   Report Status 03/08/2016 FINAL  Final   Organism ID, Bacteria ESCHERICHIA COLI (A)  Final      Susceptibility   Escherichia coli - MIC*    AMPICILLIN >=32 RESISTANT Resistant     CEFAZOLIN <=4 SENSITIVE Sensitive     CEFTRIAXONE <=1 SENSITIVE Sensitive     CIPROFLOXACIN >=4 RESISTANT Resistant     GENTAMICIN <=1 SENSITIVE Sensitive     IMIPENEM <=0.25 SENSITIVE Sensitive     NITROFURANTOIN <=16 SENSITIVE Sensitive     TRIMETH/SULFA >=320 RESISTANT Resistant     AMPICILLIN/SULBACTAM 16 INTERMEDIATE Intermediate     PIP/TAZO <=4 SENSITIVE Sensitive     Extended ESBL NEGATIVE Sensitive     * >=100,000 COLONIES/mL ESCHERICHIA COLI  C difficile quick scan w PCR reflex     Status: Abnormal   Collection Time: 03/06/16 11:18 AM  Result Value Ref Range Status   C Diff antigen POSITIVE (A) NEGATIVE Final   C Diff toxin NEGATIVE NEGATIVE Final   C Diff interpretation Results are indeterminate. See PCR results.  Final  Clostridium Difficile by PCR     Status: Abnormal   Collection Time: 03/06/16 11:18 AM  Result Value Ref Range Status   Toxigenic C Difficile by pcr POSITIVE (A) NEGATIVE Final    Comment: Positive for toxigenic C. difficile with little to no toxin production. Only treat if clinical presentation suggests symptomatic illness.    RADIOLOGY:  No results found.   Management plans discussed with  the patient, family and  they are in agreement.  CODE STATUS:     Code Status Orders        Start     Ordered   03/05/16 2127  Full code  Continuous     03/05/16 2126    Code Status History    Date Active Date Inactive Code Status Order ID Comments User Context   12/19/2015  9:57 AM 12/21/2015  6:48 PM Full Code [id number]  Adrian SaranSital Mody, MD Inpatient   11/25/2015  8:20 PM 11/27/2015  8:23 PM Full Code [id number]  Katharina Caperima Vaickute, MD Inpatient   09/22/2015  8:29 PM 09/25/2015  2:52 PM Full Code [id number]  Alford Highlandichard Wieting, MD ED   06/04/2015  4:30 AM 06/07/2015  7:22 PM Full Code [id number]  Alberteen Samhristopher P Danford, MD Inpatient   03/16/2015  7:23 PM 03/19/2015  7:52 PM Full Code [id number]  Gale Journeyatherine P Walsh, MD Inpatient    Advance Directive Documentation   Flowsheet Row Most Recent Value  Type of Advance Directive  Healthcare Power of Attorney  Pre-existing out of facility DNR order (yellow form or pink MOST form)  No data  "MOST" Form in Place?  No data      TOTAL TIME TAKING CARE OF THIS PATIENT: 37 minutes.    Shaune Pollackhen, Khamron Gellert M.D on 03/10/2016 at 8:28 AM  Between 7am to 6pm - Pager - [phone]  After 6pm go to [email or web address] - Social research officer, [email or web address] [email or web address]  [facility] [place] [facility]  Office  [phone]  CC: Primary care physician; Leotis ShamesSingh,Jasmine, MD   Note: This dictation was prepared with Dragon dictation along with smaller phrase technology. Any transcriptional errors that result from this process are unintentional.

## 2016-03-13 ENCOUNTER — Non-Acute Institutional Stay (SKILLED_NURSING_FACILITY): Payer: Medicare Other | Admitting: Internal Medicine

## 2016-03-13 ENCOUNTER — Encounter: Payer: Self-pay | Admitting: Internal Medicine

## 2016-03-13 DIAGNOSIS — R2681 Unsteadiness on feet: Secondary | ICD-10-CM | POA: Diagnosis not present

## 2016-03-13 DIAGNOSIS — F329 Major depressive disorder, single episode, unspecified: Secondary | ICD-10-CM | POA: Diagnosis not present

## 2016-03-13 DIAGNOSIS — I2510 Atherosclerotic heart disease of native coronary artery without angina pectoris: Secondary | ICD-10-CM | POA: Diagnosis not present

## 2016-03-13 DIAGNOSIS — Z9181 History of falling: Secondary | ICD-10-CM

## 2016-03-13 DIAGNOSIS — N390 Urinary tract infection, site not specified: Secondary | ICD-10-CM | POA: Diagnosis not present

## 2016-03-13 DIAGNOSIS — M1A9XX0 Chronic gout, unspecified, without tophus (tophi): Secondary | ICD-10-CM | POA: Diagnosis not present

## 2016-03-13 DIAGNOSIS — Z8673 Personal history of transient ischemic attack (TIA), and cerebral infarction without residual deficits: Secondary | ICD-10-CM | POA: Diagnosis not present

## 2016-03-13 DIAGNOSIS — K264 Chronic or unspecified duodenal ulcer with hemorrhage: Secondary | ICD-10-CM | POA: Diagnosis not present

## 2016-03-13 DIAGNOSIS — E785 Hyperlipidemia, unspecified: Secondary | ICD-10-CM

## 2016-03-13 DIAGNOSIS — B9620 Unspecified Escherichia coli [E. coli] as the cause of diseases classified elsewhere: Secondary | ICD-10-CM | POA: Diagnosis not present

## 2016-03-13 DIAGNOSIS — A0472 Enterocolitis due to Clostridium difficile, not specified as recurrent: Secondary | ICD-10-CM

## 2016-03-13 DIAGNOSIS — I10 Essential (primary) hypertension: Secondary | ICD-10-CM

## 2016-03-13 NOTE — Progress Notes (Signed)
LOCATION: [facility]  PCP: Leotis Shames, MD   Code Status: Full Code  Goals of care: Advanced Directive information Advanced Directives 03/05/2016  Does patient have an advance directive? No  Type of Advance Directive -  Does patient want to make changes to advanced directive? No - Patient declined  Copy of advanced directive(s) in chart? -  Would patient like information on creating an advanced directive? No - patient declined information  Some encounter information is confidential and restricted. Go to Review Flowsheets activity to see all data.       Extended Emergency Contact Information Primary Emergency Contact: Lee,Jamie Address: [address]           [place], [place] [address] [place] Home Phone: [phone] Relation: Daughter   No Known Allergies  Chief Complaint  Patient presents with  . New Admit To SNF    New Admission Visit     HPI:  Patient is a 75 y.o. female seen today for short term rehabilitation post hospital admission from 03/05/16-03/10/16 post fall with right knee contusion, right elbow skin tear and right scalp hematoma. She had diarrhea. CT head and cervical spine were negative for acute abnormalities. She was started on antibiotic for e.coli UTI. She was also started on po vancomycin for c.diff colitis. She is seen in her room today.   Review of Systems:  Constitutional: Negative for fever, chills,diaphoresis. Energy level is slowly coming back. HENT: Negative for headache, congestion, nasal discharge Eyes: Negative for eye pain and discharge.  Respiratory: Negative for cough, shortness of breath and wheezing.   Cardiovascular: Negative for chest pain, palpitations, leg swelling.  Gastrointestinal: Negative for heartburn, nausea, vomiting, abdominal pain, diarrhea and constipation. Last bowel movement was today. Denies rectal bleed.  Genitourinary: Negative for dysuria and flank pain.  Musculoskeletal: Negative for back  pain, fall in the facility.  Skin: Negative for itching, rash.  Neurological: Negative for dizziness. Psychiatric/Behavioral: Negative for depression.   Past Medical History:  Diagnosis Date  . Anemia   . Anxiety   . CAD (coronary artery disease)   . CHF (congestive heart failure) (HCC)   . Chronic kidney disease   . Dementia   . Depression   . Glaucoma   . Gout   . Hypertension   . Presence of permanent cardiac pacemaker   . PVD (peripheral vascular disease) (HCC)   . Renal insufficiency   . Stroke [facility])    Past Surgical History:  Procedure Laterality Date  . ABDOMINAL SURGERY    . APPENDECTOMY    . CAROTID ENDARTERECTOMY    . CHOLECYSTECTOMY    . CORONARY ANGIOPLASTY WITH STENT PLACEMENT    . PACEMAKER INSERTION    . PERCUTANEOUS PLACEMENT INTRAVASCULAR STENT CERVICAL CAROTID ARTERY     Social History:   reports that she has never smoked. She has never used smokeless tobacco. She reports that she does not drink alcohol or use drugs.  Family History  Problem Relation Age of Onset  . Hypertension Mother   . Parkinson's disease Mother   . Emphysema Father   . Diabetes Sister     Medications:   Medication List       Accurate as of 03/13/16  2:47 PM. Always use your most recent med list.          acidophilus Caps capsule Take 1 capsule by mouth daily.   allopurinol 100 MG tablet Commonly known as:  ZYLOPRIM Take 100 mg by mouth daily.  amLODipine 2.5 MG tablet Commonly known as:  NORVASC Take 1 tablet (2.5 mg total) by mouth daily.   aspirin EC 81 MG tablet Take 81 mg by mouth daily.   cholecalciferol 1000 units tablet Commonly known as:  VITAMIN D Take 1 tablet (1,000 Units total) by mouth daily.   clopidogrel 75 MG tablet Commonly known as:  PLAVIX Take 75 mg by mouth daily.   escitalopram 10 MG tablet Commonly known as:  LEXAPRO Take 10 mg by mouth daily.   folic acid 1 MG tablet Commonly known as:  FOLVITE Take 1 tablet (1 mg total)  by mouth daily.   gemfibrozil 600 MG tablet Commonly known as:  LOPID Take 600 mg by mouth 2 (two) times daily before a meal.   lovastatin 40 MG tablet Commonly known as:  MEVACOR Take 40 mg by mouth every evening.   metoprolol succinate 50 MG 24 hr tablet Commonly known as:  TOPROL-XL Take 50 mg by mouth daily.   multivitamin with minerals Tabs tablet Take 1 tablet by mouth daily.   nystatin cream Commonly known as:  MYCOSTATIN Apply 1 application topically 2 (two) times daily as needed for rash.   omeprazole 40 MG capsule Commonly known as:  PRILOSEC Take 40 mg by mouth daily.   ondansetron 4 MG tablet Commonly known as:  ZOFRAN Take 4 mg by mouth every 8 (eight) hours as needed for nausea or vomiting.   pilocarpine 1 % ophthalmic solution Commonly known as:  PILOCAR Place 1 drop into both eyes 4 (four) times daily.   vancomycin 50 mg/mL oral solution Commonly known as:  VANCOCIN Take 2.5 mLs (125 mg total) by mouth every 6 (six) hours.       Immunizations: Immunization History  Administered Date(s) Administered  . PPD Test 03/10/2016     Physical Exam: Vitals:   03/13/16 1440  BP: (!) 182/57  Pulse: (!) 57  Resp: 16  Temp: 97 F (36.1 C)  TempSrc: Oral  Weight: 159 lb (72.1 kg)  Height: 5\' 1"  (1.549 m)   Body mass index is 30.04 kg/m.  General- elderly female, obese, in no acute distress Head- normocephalic, atraumatic Nose-  no nasal discharge Throat- moist mucus membrane  Eyes- PERRLA, EOMI, no pallor, no icterus, no discharge, normal conjunctiva, normal sclera Neck- no cervical lymphadenopathy Cardiovascular- normal s1,s2, no murmur, no leg edema Respiratory- bilateral clear to auscultation, no wheeze, no rhonchi, no crackles, no use of accessory muscles Abdomen- bowel sounds present, soft, non tender Musculoskeletal- able to move all 4 extremities, generalized weakness, unsteady gait, uses a walker Neurological- alert and oriented to  person, place and time Skin- warm and dry, bruise to right forehead and right arm. Skin tear to right elbow and scab to right knee Psychiatry- normal mood and affect    Labs reviewed: Basic Metabolic Panel:  Recent Labs  16/10/96 1017  03/08/16 0741 03/09/16 0329 03/10/16 0341  NA 140  < > 141 142 141  K 3.9  < > 4.5 4.4 4.3  CL 111  < > 116* 115* 116*  CO2 19*  < > 18* 20* 20*  GLUCOSE 127*  < > 93 94 99  BUN 39*  < > 38* 42* 40*  CREATININE 1.24*  < > 1.12* 1.35* 1.08*  CALCIUM 9.3  < > 9.3 9.1 9.1  MG 1.9  --  1.9  --   --   PHOS 2.4*  --   --   --   --   < > =  values in this interval not displayed. Liver Function Tests:  Recent Labs  11/25/15 1553 12/19/15 0000 03/05/16 1017  AST 23 25 26   ALT 14 12* 11*  ALKPHOS 103 102 91  BILITOT 0.8 0.6 1.0  PROT 8.2* 6.8 6.9  ALBUMIN 4.3 3.5 3.9   No results for input(s): LIPASE, AMYLASE in the last 8760 hours. No results for input(s): AMMONIA in the last 8760 hours. CBC:  Recent Labs  03/19/15 0429 06/03/15 2229  08/16/15 1710  12/20/15 0540 03/05/16 1017 03/06/16 0424  WBC 8.9 8.1  < > 6.4  < > 11.8* 11.2* 6.2  NEUTROABS 6.1 5.8  --  4.0  --   --   --   --   HGB 11.0* 10.5*  < > 10.1*  < > 9.9* 12.3 11.2*  HCT 32.9* 33.7*  < > 31.2*  < > 29.6* 36.2 32.8*  MCV 96.1 97.7  < > 91.0  < > 92.8 94.4 94.4  PLT 183 200  < > 307  < > 242 200 170  < > = values in this interval not displayed. Cardiac Enzymes:  Recent Labs  11/26/15 0837 12/19/15 0000 03/05/16 1017  CKTOTAL  --   --  126  TROPONINI 0.03* <0.03 <0.03   BNP: Invalid input(s): POCBNP CBG:  Recent Labs  06/04/15 1156 06/04/15 1557 06/04/15 2044  GLUCAP 119* 133* 91    Radiological Exams: Dg Chest 2 View  Result Date: 03/05/2016 CLINICAL DATA:  75 year old female with a history of fall. Chest pain EXAM: CHEST  2 VIEW COMPARISON:  12/19/2015 FINDINGS: Cardiomediastinal silhouette unchanged in size and contour. Changes of prior coronary  stenting. Cardiac pacing device on the left chest wall with 2 leads in place. Calcifications of the aortic arch. No pneumothorax or pleural effusion. Coarsened interstitial markings similar to prior study. No confluent airspace disease. Degenerative changes of the spine without fracture identified. Posttraumatic changes of the right humeral head again noted. Degenerative changes of the shoulders. IMPRESSION: Chronic lung changes without evidence of superimposed acute cardiopulmonary disease. Aortic atherosclerosis. Evidence of prior coronary stenting. Unchanged cardiac pacing device on the left chest wall. Signed, Yvone Neu. Loreta Ave, DO [facility] Radiology Electronically Signed   By: Gilmer Mor D.O.   On: 03/05/2016 13:14   Dg Pelvis 1-2 Views  Result Date: 03/05/2016 CLINICAL DATA:  Patient brought in by [facility] from home for fall sometime during the night. Patient has hematoma to the right side of the head, patient has right elbow skin tear, and has bruising on the right leg. Pt states severe right shoulder pain and limited rom and pain to RLE. 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 EXAM: PELVIS - 1-2 VIEW COMPARISON:  CT, 11/25/2015 FINDINGS: No fracture.  No bone lesion. Hip joints, SI joints and symphysis pubis are normally aligned. Bones are diffusely demineralized. There are multiple surgical vascular clips in the pelvis. Soft tissues are otherwise unremarkable. IMPRESSION: No fracture or dislocation. Electronically Signed   By: Amie Portland M.D.   On: 03/05/2016 11:46   Dg Shoulder Right  Result Date: 03/05/2016 CLINICAL DATA:  Patient brought in by [facility] from home for fall sometime during the night. Patient has hematoma to the right side of the head, patient has right elbow skin tear, and has bruising on the right leg. Pt states severe right shoulder pain and limited rom and pain to RLE. Patient has hx/o multiple  strokes with right sided  residual weakness. EXAM: RIGHT SHOULDER - 2+ VIEW COMPARISON:  Chest radiograph, 11/25/2015 FINDINGS: No acute fracture. Old, healed fracture of the proximal right humerus. Glenohumeral AC joints are normally aligned. Bones are diffusely demineralized. Soft tissues are unremarkable. IMPRESSION: No acute fracture or dislocation. Electronically Signed   By: Amie Portland M.D.   On: 03/05/2016 11:43   Dg Tibia/fibula Right  Result Date: 03/05/2016 CLINICAL DATA:  Patient brought in by [facility] from home for fall sometime during the night. Patient has hematoma to the right side of the head, patient has right elbow skin tear, and has bruising on the right leg. Pt states severe right shoulder pain and limited rom and pain to RLE. 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 EXAM: RIGHT TIBIA AND FIBULA - 2 VIEW COMPARISON:  None. FINDINGS: No fracture.  No bone lesion. Knee and ankle joints are normally aligned. Bones are demineralized. Soft tissues are unremarkable. IMPRESSION: No fracture or dislocation. Electronically Signed   By: Amie Portland M.D.   On: 03/05/2016 11:45   Ct Head Wo Contrast  Result Date: 03/05/2016 CLINICAL DATA:  Fall during the night. Right head hematoma. Patient is on blood thinners. History of strokes with residual right weakness. EXAM: CT HEAD WITHOUT CONTRAST CT CERVICAL SPINE WITHOUT CONTRAST TECHNIQUE: Multidetector CT imaging of the head and cervical spine was performed following the standard protocol without intravenous contrast. Multiplanar CT image reconstructions of the cervical spine were also generated. COMPARISON:  12/19/2015 FINDINGS: CT HEAD FINDINGS Brain: The ventricles normal configuration. There is mild ex vacuo dilation of the left lateral ventricle due to an old infarct. Overall ventricular sizes are normal for age. There are no parenchymal masses or mass effect. There is an old left frontal infarct. There is no evidence  of a recent infarct. There are no extra-axial masses or abnormal fluid collections. No intracranial hemorrhage. Vascular: No hyperdense vessel or unexpected calcification. Skull: Normal. Negative for fracture or focal lesion. Sinuses/Orbits: Globes and orbits are unremarkable. Visualized sinuses and mastoid air cells are clear. Other: Right frontal scalp hematoma. CT CERVICAL SPINE FINDINGS Alignment: Straightening of the normal cervical lordosis. No spondylolisthesis. Skull base and vertebrae: No acute fracture. No primary bone lesion or focal pathologic process. Soft tissues and spinal canal: No prevertebral fluid or swelling. No visible canal hematoma. Disc levels: Moderate loss of disc height at C5-C6 with endplate osteophytes and spondylotic disc bulging. Mild loss disc height at C6-C7 with mild disc bulging. Facet degenerative change noted on the right at C3-C4-C4-C5. No significant stenosis. Upper chest: Unremarkable. Other: None IMPRESSION: HEAD CT:  No acute intracranial abnormalities.  No skull fracture. CERVICAL CT:  No fracture or acute finding. Electronically Signed   By: Amie Portland M.D.   On: 03/05/2016 11:02   Ct Cervical Spine Wo Contrast  Result Date: 03/05/2016 CLINICAL DATA:  Fall during the night. Right head hematoma. Patient is on blood thinners. History of strokes with residual right weakness. EXAM: CT HEAD WITHOUT CONTRAST CT CERVICAL SPINE WITHOUT CONTRAST TECHNIQUE: Multidetector CT imaging of the head and cervical spine was performed following the standard protocol without intravenous contrast. Multiplanar CT image reconstructions of the cervical spine were also generated. COMPARISON:  12/19/2015 FINDINGS: CT HEAD FINDINGS Brain: The ventricles normal configuration. There is mild ex vacuo dilation of the left lateral ventricle due to an old infarct. Overall ventricular sizes are normal for age. There are no parenchymal masses or mass effect. There is an old left frontal infarct.  There is no evidence of a recent infarct. There are no extra-axial masses or abnormal fluid collections. No intracranial hemorrhage. Vascular: No hyperdense vessel or unexpected calcification. Skull: Normal. Negative for fracture or focal lesion. Sinuses/Orbits: Globes and orbits are unremarkable. Visualized sinuses and mastoid air cells are clear. Other: Right frontal scalp hematoma. CT CERVICAL SPINE FINDINGS Alignment: Straightening of the normal cervical lordosis. No spondylolisthesis. Skull base and vertebrae: No acute fracture. No primary bone lesion or focal pathologic process. Soft tissues and spinal canal: No prevertebral fluid or swelling. No visible canal hematoma. Disc levels: Moderate loss of disc height at C5-C6 with endplate osteophytes and spondylotic disc bulging. Mild loss disc height at C6-C7 with mild disc bulging. Facet degenerative change noted on the right at C3-C4-C4-C5. No significant stenosis. Upper chest: Unremarkable. Other: None IMPRESSION: HEAD CT:  No acute intracranial abnormalities.  No skull fracture. CERVICAL CT:  No fracture or acute finding. Electronically Signed   By: Amie Portlandavid  Ormond M.D.   On: 03/05/2016 11:02   Dg Humerus Right  Result Date: 03/05/2016 CLINICAL DATA:  Patient brought in by [facility] from home for fall sometime during the night. Patient has hematoma to the right side of the head, patient has right elbow skin tear, and has bruising on the right leg. Pt states severe right shoulder pain and limited rom and pain to RLE. 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 EXAM: RIGHT HUMERUS - 2+ VIEW COMPARISON:  None. FINDINGS: No acute fracture. , old, healed fracture of the proximal right humerus. Shoulder and elbow joints are normally aligned. Bones are extensively demineralized cracks and bones are diffusely demineralized. Soft tissues are unremarkable. IMPRESSION: No acute fracture or dislocation. Electronically Signed   By: Amie Portlandavid  Ormond  M.D.   On: 03/05/2016 11:43   Dg Femur Min 2 Views Right  Result Date: 03/05/2016 CLINICAL DATA:  [facility] sometime during the night. Patient has hematoma to the right side of the head, patient has right elbow skin tear, and has bruising on the right leg. Pt states severe right shoulder pain and limited rom and pain to RLE. 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 EXAM: RIGHT FEMUR 2 VIEWS COMPARISON:  None. FINDINGS: No acute fracture.  No bone lesion. Knee and hip joints are normally aligned. There are degenerative changes of the knee joint. Bones are demineralized. Soft tissues are unremarkable. IMPRESSION: No fracture or dislocation. Electronically Signed   By: Amie Portlandavid  Ormond M.D.   On: 03/05/2016 11:45    Assessment/Plan  Unsteady gait Will have her work with physical therapy and occupational therapy team to help with gait training and muscle strengthening exercises.fall precautions. Skin care. Encourage to be out of bed.   E.coli UTI Completed her course of keflex, continue to encourage hydration  C.diff colitis No further diarrhea reported. Continue and complete po vancomycin 125 mg qid until 03/20/16. Hydration to be maintined. Check cmp and mg  History of fall Recently had a fall and has brusies. Fall precautions to be taken. To use walker and work with therapy team  Hypertension Elevated bp, denies symptom. Monitor bp q shift. Continue metoprolol succinate 50 mg daily and amlodipine 2.5 mg daily  CAD Chest pain free. Continue aspirin and plavix with statin and b blocker  Hyperlipidemia Continue gemfibrozil and lovastatin  Duodenal ulcer Symptom stable, continue omeprazole and monitor. No bleed reported  Gout No recent attack, continue allopurinol  Chronic depression  Stable mood. Continue escitalopram  History of cva Continue aspirin, plavix and statin   Goals of care: short term  rehabilitation   Labs/tests ordered: cbc, cmp 03/20/16  Family/ staff Communication: reviewed care plan with patient and nursing supervisor    Oneal Grout, MD Internal Medicine [facility] [address] [place], [place] [address] Cell Phone (Monday-Friday 8 am - 5 pm): [phone] On Call: [phone] and follow prompts after 5 pm and on weekends Office Phone: [phone] Office Fax: [phone]

## 2016-03-20 ENCOUNTER — Non-Acute Institutional Stay (SKILLED_NURSING_FACILITY): Payer: Medicare Other | Admitting: Internal Medicine

## 2016-03-20 ENCOUNTER — Encounter: Payer: Self-pay | Admitting: Internal Medicine

## 2016-03-20 DIAGNOSIS — A0472 Enterocolitis due to Clostridium difficile, not specified as recurrent: Secondary | ICD-10-CM

## 2016-03-20 NOTE — Progress Notes (Signed)
LOCATION: [facility]  PCP: Leotis Shames, MD   Code Status: Full Code  Goals of care: Advanced Directive information Advanced Directives 03/05/2016  Does patient have an advance directive? No  Type of Advance Directive -  Does patient want to make changes to advanced directive? No - Patient declined  Copy of advanced directive(s) in chart? -  Would patient like information on creating an advanced directive? No - patient declined information  Some encounter information is confidential and restricted. Go to Review Flowsheets activity to see all data.       Extended Emergency Contact Information Primary Emergency Contact: Lee,Jamie Address: [address]           [place], [place] [address] [place] Home Phone: [phone] Relation: Daughter   No Known Allergies  Chief Complaint  Patient presents with  . Acute Visit    Loose Stool      HPI:  Patient is a 75 y.o. female seen today for acute visit. Staff reports loose stool. Per patient and CNA has soft stool x past several days with one bowel movement a day. Patient is alert and oriented. Denies any abdominal pain, nausea and vomiting. Denies blood in stool. She is here for short term rehabilitation post hospital admission from 03/05/16-03/10/16 post fall with right knee contusion, right elbow skin tear and right scalp hematoma and c.diff associated diarrhea. She is currently on po vancomycin.  Review of Systems:  Constitutional: Negative for fever, chills,diaphoresis.  HENT: Negative for headache Gastrointestinal: Negative for heartburn, nausea, vomiting, abdominal pain, diarrhea and constipation. Last bowel movement was today. Denies rectal bleed.  Genitourinary: Negative for dysuria Neurological: Negative for dizziness. Psychiatric/Behavioral: Negative for depression.   Past Medical History:  Diagnosis Date  . Anemia   . Anxiety   . CAD (coronary artery disease)   . CHF (congestive heart failure)  (HCC)   . Chronic kidney disease   . Dementia   . Depression   . Glaucoma   . Gout   . Hypertension   . Presence of permanent cardiac pacemaker   . PVD (peripheral vascular disease) (HCC)   . Renal insufficiency   . Stroke [facility])    Past Surgical History:  Procedure Laterality Date  . ABDOMINAL SURGERY    . APPENDECTOMY    . CAROTID ENDARTERECTOMY    . CHOLECYSTECTOMY    . CORONARY ANGIOPLASTY WITH STENT PLACEMENT    . PACEMAKER INSERTION    . PERCUTANEOUS PLACEMENT INTRAVASCULAR STENT CERVICAL CAROTID ARTERY     Social History:   reports that she has never smoked. She has never used smokeless tobacco. She reports that she does not drink alcohol or use drugs.  Family History  Problem Relation Age of Onset  . Hypertension Mother   . Parkinson's disease Mother   . Emphysema Father   . Diabetes Sister     Medications:   Medication List       Accurate as of 03/20/16  1:21 PM. Always use your most recent med list.          acidophilus Caps capsule Take 1 capsule by mouth daily.   allopurinol 100 MG tablet Commonly known as:  ZYLOPRIM Take 100 mg by mouth daily.   amLODipine 2.5 MG tablet Commonly known as:  NORVASC Take 1 tablet (2.5 mg total) by mouth daily.   aspirin EC 81 MG tablet Take 81 mg by mouth daily.   atorvastatin 10 MG tablet Commonly known as:  LIPITOR  Take 10 mg by mouth daily.   cholecalciferol 1000 units tablet Commonly known as:  VITAMIN D Take 1 tablet (1,000 Units total) by mouth daily.   clopidogrel 75 MG tablet Commonly known as:  PLAVIX Take 75 mg by mouth daily.   escitalopram 10 MG tablet Commonly known as:  LEXAPRO Take 10 mg by mouth daily.   folic acid 1 MG tablet Commonly known as:  FOLVITE Take 1 tablet (1 mg total) by mouth daily.   gemfibrozil 600 MG tablet Commonly known as:  LOPID Take 600 mg by mouth 2 (two) times daily before a meal.   metoprolol succinate 50 MG 24 hr tablet Commonly known as:   TOPROL-XL Take 50 mg by mouth daily.   multivitamin with minerals Tabs tablet Take 1 tablet by mouth daily.   nystatin cream Commonly known as:  MYCOSTATIN Apply 1 application topically 2 (two) times daily as needed for rash.   ondansetron 4 MG tablet Commonly known as:  ZOFRAN Take 4 mg by mouth every 8 (eight) hours as needed for nausea or vomiting.   pantoprazole 40 MG tablet Commonly known as:  PROTONIX Take 40 mg by mouth daily.   pilocarpine 1 % ophthalmic solution Commonly known as:  PILOCAR Place 1 drop into both eyes 4 (four) times daily.   vancomycin 50 mg/mL oral solution Commonly known as:  VANCOCIN Take 2.5 mLs (125 mg total) by mouth every 6 (six) hours.       Immunizations: Immunization History  Administered Date(s) Administered  . PPD Test 03/10/2016     Physical Exam: Vitals:   03/20/16 1316  BP: 136/76  Pulse: 68  Resp: 18  Temp: 98.2 F (36.8 C)  TempSrc: Oral  SpO2: 95%  Weight: 159 lb (72.1 kg)  Height: 5\' 1"  (1.549 m)   Body mass index is 30.04 kg/m.  General- elderly female, obese, in no acute distress Head- normocephalic, atraumatic Throat- moist mucus membrane  Eyes- PERRLA, EOMI, no pallor, no icterus Cardiovascular- normal s1,s2, no murmur, no leg edema Respiratory- bilateral clear to auscultation Abdomen- bowel sounds present, soft, non tender Musculoskeletal- able to move all 4 extremities, generalized weakness, unsteady gait, uses a walker Neurological- alert and oriented to person, place and time    Labs reviewed: Basic Metabolic Panel:  Recent Labs  19/14/7809/01/12 1017  03/08/16 0741 03/09/16 0329 03/10/16 0341  NA 140  < > 141 142 141  K 3.9  < > 4.5 4.4 4.3  CL 111  < > 116* 115* 116*  CO2 19*  < > 18* 20* 20*  GLUCOSE 127*  < > 93 94 99  BUN 39*  < > 38* 42* 40*  CREATININE 1.24*  < > 1.12* 1.35* 1.08*  CALCIUM 9.3  < > 9.3 9.1 9.1  MG 1.9  --  1.9  --   --   PHOS 2.4*  --   --   --   --   < > = values in  this interval not displayed. Liver Function Tests:  Recent Labs  11/25/15 1553 12/19/15 0000 03/05/16 1017  AST 23 25 26   ALT 14 12* 11*  ALKPHOS 103 102 91  BILITOT 0.8 0.6 1.0  PROT 8.2* 6.8 6.9  ALBUMIN 4.3 3.5 3.9   No results for input(s): LIPASE, AMYLASE in the last 8760 hours. No results for input(s): AMMONIA in the last 8760 hours. CBC:  Recent Labs  06/03/15 2229  08/16/15 1710  12/20/15 0540 03/05/16 1017 03/06/16  0424  WBC 8.1  < > 6.4  < > 11.8* 11.2* 6.2  NEUTROABS 5.8  --  4.0  --   --   --   --   HGB 10.5*  < > 10.1*  < > 9.9* 12.3 11.2*  HCT 33.7*  < > 31.2*  < > 29.6* 36.2 32.8*  MCV 97.7  < > 91.0  < > 92.8 94.4 94.4  PLT 200  < > 307  < > 242 200 170  < > = values in this interval not displayed. Cardiac Enzymes:  Recent Labs  11/26/15 0837 12/19/15 0000 03/05/16 1017  CKTOTAL  --   --  126  TROPONINI 0.03* <0.03 <0.03   BNP: Invalid input(s): POCBNP CBG:  Recent Labs  06/04/15 1156 06/04/15 1557 06/04/15 2044  GLUCAP 119* 133* 91    Radiological Exams: Dg Chest 2 View  Result Date: 03/05/2016 CLINICAL DATA:  75 year old female with a history of fall. Chest pain EXAM: CHEST  2 VIEW COMPARISON:  12/19/2015 FINDINGS: Cardiomediastinal silhouette unchanged in size and contour. Changes of prior coronary stenting. Cardiac pacing device on the left chest wall with 2 leads in place. Calcifications of the aortic arch. No pneumothorax or pleural effusion. Coarsened interstitial markings similar to prior study. No confluent airspace disease. Degenerative changes of the spine without fracture identified. Posttraumatic changes of the right humeral head again noted. Degenerative changes of the shoulders. IMPRESSION: Chronic lung changes without evidence of superimposed acute cardiopulmonary disease. Aortic atherosclerosis. Evidence of prior coronary stenting. Unchanged cardiac pacing device on the left chest wall. Signed, Yvone Neu. Loreta Ave, DO [facility] Radiology Electronically Signed   By: Gilmer Mor D.O.   On: 03/05/2016 13:14   Dg Pelvis 1-2 Views  Result Date: 03/05/2016 CLINICAL DATA:  Patient brought in by [facility] from home for fall sometime during the night. Patient has hematoma to the right side of the head, patient has right elbow skin tear, and has bruising on the right leg. Pt states severe right shoulder pain and limited rom and pain to RLE. 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 EXAM: PELVIS - 1-2 VIEW COMPARISON:  CT, 11/25/2015 FINDINGS: No fracture.  No bone lesion. Hip joints, SI joints and symphysis pubis are normally aligned. Bones are diffusely demineralized. There are multiple surgical vascular clips in the pelvis. Soft tissues are otherwise unremarkable. IMPRESSION: No fracture or dislocation. Electronically Signed   By: Amie Portland M.D.   On: 03/05/2016 11:46   Dg Shoulder Right  Result Date: 03/05/2016 CLINICAL DATA:  Patient brought in by [facility] from home for fall sometime during the night. Patient has hematoma to the right side of the head, patient has right elbow skin tear, and has bruising on the right leg. Pt states severe right shoulder pain and limited rom and pain to RLE. 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 EXAM: RIGHT SHOULDER - 2+ VIEW COMPARISON:  Chest radiograph, 11/25/2015 FINDINGS: No acute fracture. Old, healed fracture of the proximal right humerus. Glenohumeral AC joints are normally aligned. Bones are diffusely demineralized. Soft tissues are unremarkable. IMPRESSION: No acute fracture or dislocation. Electronically Signed   By: Amie Portland M.D.   On: 03/05/2016 11:43   Dg Tibia/fibula Right  Result Date: 03/05/2016 CLINICAL DATA:  Patient brought in by [facility] from home for fall sometime during the night. Patient has hematoma to the right side of the head, patient has right  elbow skin tear, and  has bruising on the right leg. Pt states severe right shoulder pain and limited rom and pain to RLE. 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 EXAM: RIGHT TIBIA AND FIBULA - 2 VIEW COMPARISON:  None. FINDINGS: No fracture.  No bone lesion. Knee and ankle joints are normally aligned. Bones are demineralized. Soft tissues are unremarkable. IMPRESSION: No fracture or dislocation. Electronically Signed   By: Amie Portland M.D.   On: 03/05/2016 11:45   Ct Head Wo Contrast  Result Date: 03/05/2016 CLINICAL DATA:  Fall during the night. Right head hematoma. Patient is on blood thinners. History of strokes with residual right weakness. EXAM: CT HEAD WITHOUT CONTRAST CT CERVICAL SPINE WITHOUT CONTRAST TECHNIQUE: Multidetector CT imaging of the head and cervical spine was performed following the standard protocol without intravenous contrast. Multiplanar CT image reconstructions of the cervical spine were also generated. COMPARISON:  12/19/2015 FINDINGS: CT HEAD FINDINGS Brain: The ventricles normal configuration. There is mild ex vacuo dilation of the left lateral ventricle due to an old infarct. Overall ventricular sizes are normal for age. There are no parenchymal masses or mass effect. There is an old left frontal infarct. There is no evidence of a recent infarct. There are no extra-axial masses or abnormal fluid collections. No intracranial hemorrhage. Vascular: No hyperdense vessel or unexpected calcification. Skull: Normal. Negative for fracture or focal lesion. Sinuses/Orbits: Globes and orbits are unremarkable. Visualized sinuses and mastoid air cells are clear. Other: Right frontal scalp hematoma. CT CERVICAL SPINE FINDINGS Alignment: Straightening of the normal cervical lordosis. No spondylolisthesis. Skull base and vertebrae: No acute fracture. No primary bone lesion or focal pathologic process. Soft tissues and spinal canal: No prevertebral fluid or swelling. No visible canal  hematoma. Disc levels: Moderate loss of disc height at C5-C6 with endplate osteophytes and spondylotic disc bulging. Mild loss disc height at C6-C7 with mild disc bulging. Facet degenerative change noted on the right at C3-C4-C4-C5. No significant stenosis. Upper chest: Unremarkable. Other: None IMPRESSION: HEAD CT:  No acute intracranial abnormalities.  No skull fracture. CERVICAL CT:  No fracture or acute finding. Electronically Signed   By: Amie Portland M.D.   On: 03/05/2016 11:02   Ct Cervical Spine Wo Contrast  Result Date: 03/05/2016 CLINICAL DATA:  Fall during the night. Right head hematoma. Patient is on blood thinners. History of strokes with residual right weakness. EXAM: CT HEAD WITHOUT CONTRAST CT CERVICAL SPINE WITHOUT CONTRAST TECHNIQUE: Multidetector CT imaging of the head and cervical spine was performed following the standard protocol without intravenous contrast. Multiplanar CT image reconstructions of the cervical spine were also generated. COMPARISON:  12/19/2015 FINDINGS: CT HEAD FINDINGS Brain: The ventricles normal configuration. There is mild ex vacuo dilation of the left lateral ventricle due to an old infarct. Overall ventricular sizes are normal for age. There are no parenchymal masses or mass effect. There is an old left frontal infarct. There is no evidence of a recent infarct. There are no extra-axial masses or abnormal fluid collections. No intracranial hemorrhage. Vascular: No hyperdense vessel or unexpected calcification. Skull: Normal. Negative for fracture or focal lesion. Sinuses/Orbits: Globes and orbits are unremarkable. Visualized sinuses and mastoid air cells are clear. Other: Right frontal scalp hematoma. CT CERVICAL SPINE FINDINGS Alignment: Straightening of the normal cervical lordosis. No spondylolisthesis. Skull base and vertebrae: No acute fracture. No primary bone lesion or focal pathologic process. Soft tissues and spinal canal: No prevertebral fluid or swelling.  No visible canal  hematoma. Disc levels: Moderate loss of disc height at C5-C6 with endplate osteophytes and spondylotic disc bulging. Mild loss disc height at C6-C7 with mild disc bulging. Facet degenerative change noted on the right at C3-C4-C4-C5. No significant stenosis. Upper chest: Unremarkable. Other: None IMPRESSION: HEAD CT:  No acute intracranial abnormalities.  No skull fracture. CERVICAL CT:  No fracture or acute finding. Electronically Signed   By: Amie Portland M.D.   On: 03/05/2016 11:02   Dg Humerus Right  Result Date: 03/05/2016 CLINICAL DATA:  Patient brought in by [facility] from home for fall sometime during the night. Patient has hematoma to the right side of the head, patient has right elbow skin tear, and has bruising on the right leg. Pt states severe right shoulder pain and limited rom and pain to RLE. 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 EXAM: RIGHT HUMERUS - 2+ VIEW COMPARISON:  None. FINDINGS: No acute fracture. , old, healed fracture of the proximal right humerus. Shoulder and elbow joints are normally aligned. Bones are extensively demineralized cracks and bones are diffusely demineralized. Soft tissues are unremarkable. IMPRESSION: No acute fracture or dislocation. Electronically Signed   By: Amie Portland M.D.   On: 03/05/2016 11:43   Dg Femur Min 2 Views Right  Result Date: 03/05/2016 CLINICAL DATA:  Patient brought in by [facility] from home for fall sometime during the night. Patient has hematoma to the right side of the head, patient has right elbow skin tear, and has bruising on the right leg. Pt states severe right shoulder pain and limited rom and pain to RLE. 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 EXAM: RIGHT FEMUR 2 VIEWS COMPARISON:  None. FINDINGS: No acute fracture.  No bone lesion. Knee and hip joints are normally aligned. There are degenerative changes of the knee joint. Bones are demineralized. Soft tissues  are unremarkable. IMPRESSION: No fracture or dislocation. Electronically Signed   By: Amie Portland M.D.   On: 03/05/2016 11:45    Assessment/Plan  C.diff colitis No further diarrhea reported by patient. Continue and complete po vancomycin 125 mg qid today. Hydration to be maintined. Monitor for loose stool    Family/ staff Communication: reviewed care plan with patient and nursing supervisor    Oneal Grout, MD Internal Medicine [facility] [address] [place], [place] [address] Cell Phone (Monday-Friday 8 am - 5 pm): [phone] On Call: [phone] and follow prompts after 5 pm and on weekends Office Phone: [phone] Office Fax: [phone]

## 2016-03-22 ENCOUNTER — Non-Acute Institutional Stay (SKILLED_NURSING_FACILITY): Payer: Medicare Other | Admitting: Family

## 2016-03-22 DIAGNOSIS — R197 Diarrhea, unspecified: Secondary | ICD-10-CM | POA: Diagnosis not present

## 2016-03-22 DIAGNOSIS — I10 Essential (primary) hypertension: Secondary | ICD-10-CM

## 2016-03-22 NOTE — Progress Notes (Signed)
Location:  [facility] Room Number: 702  Place of Service:  SNF (31) Provider:  Nelly Scriven FNP-C   Singh,Jasmine, MD  Patient Care Team: Leotis Shames, MD as PCP - General (Internal Medicine) Delma Freeze, FNP as Nurse Practitioner (Family Medicine) Dalia Heading, MD as Consulting Physician (Cardiology)  Extended Emergency Contact Information Primary Emergency Contact: Lee,Jamie Address: [address]           [place], [place] [address] [place] Home Phone: [phone] Relation: Daughter  Code Status: Full Code  Goals of care: Advanced Directive information Advanced Directives 03/05/2016  Does patient have an advance directive? No  Type of Advance Directive -  Does patient want to make changes to advanced directive? No - Patient declined  Copy of advanced directive(s) in chart? -  Would patient like information on creating an advanced directive? No - patient declined information  Some encounter information is confidential and restricted. Go to Review Flowsheets activity to see all data.     Chief Complaint  Patient presents with  . Acute Visit    High B/P     HPI:  Pt is a 75 y.o. female seen today at [facility] for an acute visit for evaluation of high blood pressure. She has a significant medical history of HTN, CHF, CAD among other conditions. She is seen in her room today per facility Nurse request who states patient's B/P elevated manual SBP ranging in the 160's -170's.She denies any dizziness, HA , nausea, vomiting, shortness of breath or chest pains. She states her diarrhea has improved has had one loose BM prior to visit. She continues to exercise with PT/OT. No fever or chills reported.     Past Medical History:  Diagnosis Date  . Anemia   . Anxiety   . CAD (coronary artery disease)   . CHF (congestive heart failure) (HCC)   . Chronic kidney disease   . Dementia   . Depression   .  Glaucoma   . Gout   . Hypertension   . Presence of permanent cardiac pacemaker   . PVD (peripheral vascular disease) (HCC)   . Renal insufficiency   . Stroke [facility])    Past Surgical History:  Procedure Laterality Date  . ABDOMINAL SURGERY    . APPENDECTOMY    . CAROTID ENDARTERECTOMY    . CHOLECYSTECTOMY    . CORONARY ANGIOPLASTY WITH STENT PLACEMENT    . PACEMAKER INSERTION    . PERCUTANEOUS PLACEMENT INTRAVASCULAR STENT CERVICAL CAROTID ARTERY      No Known Allergies    Medication List       Accurate as of 03/22/16  4:06 PM. Always use your most recent med list.          acidophilus Caps capsule Take 1 capsule by mouth daily.   allopurinol 100 MG tablet Commonly known as:  ZYLOPRIM Take 100 mg by mouth daily.   amLODipine 2.5 MG tablet Commonly known as:  NORVASC Take 1 tablet (2.5 mg total) by mouth daily.   aspirin EC 81 MG tablet Take 81 mg by mouth daily.   atorvastatin 10 MG tablet Commonly known as:  LIPITOR Take 10 mg by mouth daily.   cholecalciferol 1000 units tablet Commonly known as:  VITAMIN D Take 1 tablet (1,000 Units total) by mouth daily.   clopidogrel 75 MG tablet Commonly known as:  PLAVIX Take 75 mg by mouth daily.   escitalopram  10 MG tablet Commonly known as:  LEXAPRO Take 10 mg by mouth daily.   folic acid 1 MG tablet Commonly known as:  FOLVITE Take 1 tablet (1 mg total) by mouth daily.   gemfibrozil 600 MG tablet Commonly known as:  LOPID Take 600 mg by mouth 2 (two) times daily before a meal.   metoprolol succinate 50 MG 24 hr tablet Commonly known as:  TOPROL-XL Take 50 mg by mouth daily.   multivitamin with minerals Tabs tablet Take 1 tablet by mouth daily.   nystatin cream Commonly known as:  MYCOSTATIN Apply 1 application topically 2 (two) times daily as needed for rash.   ondansetron 4 MG tablet Commonly known as:  ZOFRAN Take 4 mg by mouth every 8 (eight) hours as needed for nausea or vomiting.     pantoprazole 40 MG tablet Commonly known as:  PROTONIX Take 40 mg by mouth daily.   pilocarpine 1 % ophthalmic solution Commonly known as:  PILOCAR Place 1 drop into both eyes 4 (four) times daily.   vancomycin 50 mg/mL oral solution Commonly known as:  VANCOCIN Take 2.5 mLs (125 mg total) by mouth every 6 (six) hours.       Review of Systems  Constitutional: Negative for activity change, appetite change, chills, fatigue and fever.  HENT: Negative for congestion, rhinorrhea, sinus pressure, sneezing and sore throat.   Eyes: Negative.   Respiratory: Negative for cough, chest tightness, shortness of breath and wheezing.   Cardiovascular: Negative for chest pain, palpitations and leg swelling.  Gastrointestinal: Negative for abdominal distention, abdominal pain, constipation, diarrhea, nausea and vomiting.  Genitourinary: Negative for dysuria, frequency and urgency.  Musculoskeletal: Positive for gait problem.  Skin: Negative for color change, pallor and rash.  Neurological: Negative for dizziness, seizures, syncope, light-headedness and headaches.  Psychiatric/Behavioral: Negative for agitation, confusion, hallucinations and sleep disturbance. The patient is not nervous/anxious.     Immunization History  Administered Date(s) Administered  . PPD Test 03/10/2016   Pertinent  Health Maintenance Due  Topic Date Due  . MAMMOGRAM  05/13/1992  . COLONOSCOPY  05/13/1992  . DEXA SCAN  05/14/2007  . PNA vac Low Risk Adult (1 of 2 - PCV13) 05/14/2007  . INFLUENZA VACCINE  12/28/2015      Vitals:   03/22/16 1115  BP: (!) 158/74  Pulse: 72  Resp: 20  Temp: 98.3 F (36.8 C)  SpO2: 97%  Weight: 159 lb (72.1 kg)  Height: 5\' 1"  (1.549 m)   Body mass index is 30.04 kg/m. Physical Exam  Constitutional: She is oriented to person, place, and time. She appears well-developed and well-nourished. No distress.  HENT:  Head: Normocephalic.  Mouth/Throat: Oropharynx is clear and  moist. No oropharyngeal exudate.  Eyes: Conjunctivae and EOM are normal. Pupils are equal, round, and reactive to light. Right eye exhibits no discharge. Left eye exhibits no discharge. No scleral icterus.  Neck: Normal range of motion. No JVD present. No thyromegaly present.  Cardiovascular: Normal rate, regular rhythm, normal heart sounds and intact distal pulses.  Exam reveals no gallop and no friction rub.   No murmur heard. Pulmonary/Chest: Effort normal and breath sounds normal. No respiratory distress. She has no wheezes. She has no rales.  Abdominal: Soft. Bowel sounds are normal. She exhibits no distension. There is no tenderness. There is no rebound and no guarding.  Musculoskeletal: She exhibits no edema, tenderness or deformity.  Unsteady gait. Moves x 4 extremities   Lymphadenopathy:    She  has no cervical adenopathy.  Neurological: She is oriented to person, place, and time.  Skin: Skin is warm and dry. No rash noted. No erythema. No pallor.  Psychiatric: She has a normal mood and affect.    Labs reviewed:  Recent Labs  03/05/16 1017  03/08/16 0741 03/09/16 0329 03/10/16 0341  NA 140  < > 141 142 141  K 3.9  < > 4.5 4.4 4.3  CL 111  < > 116* 115* 116*  CO2 19*  < > 18* 20* 20*  GLUCOSE 127*  < > 93 94 99  BUN 39*  < > 38* 42* 40*  CREATININE 1.24*  < > 1.12* 1.35* 1.08*  CALCIUM 9.3  < > 9.3 9.1 9.1  MG 1.9  --  1.9  --   --   PHOS 2.4*  --   --   --   --   < > = values in this interval not displayed.  Recent Labs  11/25/15 1553 12/19/15 0000 03/05/16 1017  AST 23 25 26   ALT 14 12* 11*  ALKPHOS 103 102 91  BILITOT 0.8 0.6 1.0  PROT 8.2* 6.8 6.9  ALBUMIN 4.3 3.5 3.9    Recent Labs  06/03/15 2229  08/16/15 1710  12/20/15 0540 03/05/16 1017 03/06/16 0424  WBC 8.1  < > 6.4  < > 11.8* 11.2* 6.2  NEUTROABS 5.8  --  4.0  --   --   --   --   HGB 10.5*  < > 10.1*  < > 9.9* 12.3 11.2*  HCT 33.7*  < > 31.2*  < > 29.6* 36.2 32.8*  MCV 97.7  < > 91.0  < >  92.8 94.4 94.4  PLT 200  < > 307  < > 242 200 170  < > = values in this interval not displayed. Lab Results  Component Value Date   TSH 4.351 03/16/2015   Lab Results  Component Value Date   HGBA1C 5.2 09/25/2012   Lab Results  Component Value Date   CHOL 150 03/17/2015   HDL 41 03/17/2015   LDLCALC 78 03/17/2015   TRIG 153 (H) 03/17/2015   CHOLHDL 3.7 03/17/2015   Assessment/Plan 1. Essential hypertension, benign SBP ranging in the 160's-170's. Will increase Norvasc to 5 mg Tablet daily. Facility staff to monitor B/p and HR every shift X 1 week then resume previous orders.   2. Diarrhea, unspecified type Has improved. Reports x 1 loose stool continue to monitor.     Family/ staff Communication: Reviewed plan of care with patient and facility Nurse supervisor.   Labs/tests ordered: None

## 2016-03-23 ENCOUNTER — Non-Acute Institutional Stay (SKILLED_NURSING_FACILITY): Payer: Medicare Other | Admitting: Family

## 2016-03-23 DIAGNOSIS — I5022 Chronic systolic (congestive) heart failure: Secondary | ICD-10-CM | POA: Diagnosis not present

## 2016-03-23 DIAGNOSIS — F418 Other specified anxiety disorders: Secondary | ICD-10-CM | POA: Diagnosis not present

## 2016-03-23 DIAGNOSIS — A09 Infectious gastroenteritis and colitis, unspecified: Secondary | ICD-10-CM | POA: Diagnosis not present

## 2016-03-23 DIAGNOSIS — I10 Essential (primary) hypertension: Secondary | ICD-10-CM

## 2016-03-23 DIAGNOSIS — I2510 Atherosclerotic heart disease of native coronary artery without angina pectoris: Secondary | ICD-10-CM | POA: Diagnosis not present

## 2016-03-23 DIAGNOSIS — E785 Hyperlipidemia, unspecified: Secondary | ICD-10-CM | POA: Diagnosis not present

## 2016-03-23 DIAGNOSIS — R531 Weakness: Secondary | ICD-10-CM | POA: Diagnosis not present

## 2016-03-23 MED ORDER — HYDRALAZINE HCL 25 MG PO TABS: 25.0000 mg | ORAL_TABLET | Freq: Three times a day (TID) | ORAL | Status: AC

## 2016-03-23 NOTE — Progress Notes (Signed)
Location:  [facility] Room Number: 701  Place of Service:  SNF [phone])  Provider: Richarda Blade FNP-C   PCP: Leotis Shames, MD Patient Care Team: Leotis Shames, MD as PCP - General (Internal Medicine) Delma Freeze, FNP as Nurse Practitioner (Family Medicine) Dalia Heading, MD as Consulting Physician (Cardiology)  Extended Emergency Contact Information Primary Emergency Contact: Lee,Jamie Address: [address]           [place], [place] [address] [place] Home Phone: [phone] Relation: Daughter  Code Status: Full Code  Goals of care:  Advanced Directive information Advanced Directives 03/05/2016  Does patient have an advance directive? No  Type of Advance Directive -  Does patient want to make changes to advanced directive? No - Patient declined  Copy of advanced directive(s) in chart? -  Would patient like information on creating an advanced directive? No - patient declined information  Some encounter information is confidential and restricted. Go to Review Flowsheets activity to see all data.     No Known Allergies  Chief Complaint  Patient presents with  . Discharge Note    Discharge with family to relocate to [place]     HPI:  75 y.o. female seen today at  [facility] for discharge home with family to relocate to [place]. She was here for short term rehabilitation post hospital admission from 03/05/16-03/10/16 post fall with right knee contusion, right elbow skin tear and right scalp hematoma. CT scan of the head and cervical spine were negative for acute abnormalities.She had diarrhea positive for C-diff was started on oral vancomycin. She was also started on antibiotic for e.coli UTI. Her blood pressure medication Hydralazine , imdur and lasix were discontinued during course of hospital admission due to low B/P. She has a medical history of HTN, CHF, CKD, CAD, Pace maker, Stroke, Depression,  anxiety, Gout, PVD among other conditions. She is seen in her room today.She states diarrhea resolved though has chronic loose stool at baseline.Facility Nurse reports patient's SBP ranging in the 160's -190's. Amlodipine increased to 5 mg Tablet one day ago.She denies any acute issues this visit. She has worked well with PT/OT now stable for discharge home.She will be discharged home with family to relocate to [place].No Home health or DME requested.  She will be discharge with medications from the facility. Prescription medication will be written x 1 month then patient to follow up with PCP in 1-2 weeks.       Past Medical History:  Diagnosis Date  . Anemia   . Anxiety   . CAD (coronary artery disease)   . CHF (congestive heart failure) (HCC)   . Chronic kidney disease   . Dementia   . Depression   . Glaucoma   . Gout   . Hypertension   . Presence of permanent cardiac pacemaker   . PVD (peripheral vascular disease) (HCC)   . Renal insufficiency   . Stroke [facility])     Past Surgical History:  Procedure Laterality Date  . ABDOMINAL SURGERY    . APPENDECTOMY    . CAROTID ENDARTERECTOMY    . CHOLECYSTECTOMY    . CORONARY ANGIOPLASTY WITH STENT PLACEMENT    . PACEMAKER INSERTION    . PERCUTANEOUS PLACEMENT INTRAVASCULAR STENT CERVICAL CAROTID ARTERY        reports that she has never smoked. She has never used smokeless tobacco. She reports that she does not drink alcohol or use drugs.  Social History   Social History  . Marital status: Married    Spouse name: N/A  . Number of children: N/A  . Years of education: N/A   Occupational History  . Not on file.   Social History Main Topics  . Smoking status: Never Smoker  . Smokeless tobacco: Never Used  . Alcohol use No  . Drug use: No  . Sexual activity: Not on file   Other Topics Concern  . Not on file   Social History Narrative  . No narrative on file      No Known Allergies  Pertinent  Health Maintenance Due    Topic Date Due  . MAMMOGRAM  05/13/1992  . COLONOSCOPY  05/13/1992  . DEXA SCAN  05/14/2007  . PNA vac Low Risk Adult (1 of 2 - PCV13) 05/14/2007  . INFLUENZA VACCINE  12/28/2015    Medications:   Medication List       Accurate as of 03/23/16  6:07 PM. Always use your most recent med list.          acidophilus Caps capsule Take 1 capsule by mouth daily.   allopurinol 100 MG tablet Commonly known as:  ZYLOPRIM Take 100 mg by mouth daily.   amLODipine 2.5 MG tablet Commonly known as:  NORVASC Take 1 tablet (2.5 mg total) by mouth daily.   aspirin EC 81 MG tablet Take 81 mg by mouth daily.   atorvastatin 10 MG tablet Commonly known as:  LIPITOR Take 10 mg by mouth daily.   cholecalciferol 1000 units tablet Commonly known as:  VITAMIN D Take 1 tablet (1,000 Units total) by mouth daily.   clopidogrel 75 MG tablet Commonly known as:  PLAVIX Take 75 mg by mouth daily.   escitalopram 10 MG tablet Commonly known as:  LEXAPRO Take 10 mg by mouth daily.   folic acid 1 MG tablet Commonly known as:  FOLVITE Take 1 tablet (1 mg total) by mouth daily.   gemfibrozil 600 MG tablet Commonly known as:  LOPID Take 600 mg by mouth 2 (two) times daily before a meal.   metoprolol succinate 50 MG 24 hr tablet Commonly known as:  TOPROL-XL Take 50 mg by mouth daily.   multivitamin with minerals Tabs tablet Take 1 tablet by mouth daily.   nystatin cream Commonly known as:  MYCOSTATIN Apply 1 application topically 2 (two) times daily as needed for rash.   ondansetron 4 MG tablet Commonly known as:  ZOFRAN Take 4 mg by mouth every 8 (eight) hours as needed for nausea or vomiting.   pantoprazole 40 MG tablet Commonly known as:  PROTONIX Take 40 mg by mouth daily.   pilocarpine 1 % ophthalmic solution Commonly known as:  PILOCAR Place 1 drop into both eyes 4 (four) times daily.       Review of Systems  Constitutional: Negative for activity change, appetite  change, chills, fatigue and fever.  HENT: Negative for congestion, rhinorrhea, sinus pressure, sneezing and sore throat.   Eyes: Negative.   Respiratory: Negative for cough, chest tightness, shortness of breath and wheezing.   Cardiovascular: Negative for chest pain, palpitations and leg swelling.  Gastrointestinal: Negative for abdominal distention, abdominal pain, constipation, diarrhea, nausea and vomiting.  Endocrine: Negative.   Genitourinary: Negative for dysuria, frequency and urgency.  Musculoskeletal: Positive for gait problem.  Skin: Negative for color change, pallor and rash.  Neurological: Negative for dizziness, seizures, syncope, light-headedness and headaches.  Hematological: Does not bruise/bleed easily.  Psychiatric/Behavioral:  Negative for agitation, confusion, hallucinations and sleep disturbance. The patient is not nervous/anxious.     Vitals:   03/23/16 1200  BP: (!) 152/59  Pulse: 64  Resp: 20  Temp: 97.8 F (36.6 C)  SpO2: 98%  Weight: 159 lb (72.1 kg)  Height: 5\' 1"  (1.549 m)   Body mass index is 30.04 kg/m. Physical Exam  Constitutional: She is oriented to person, place, and time. She appears well-developed and well-nourished. No distress.  HENT:  Head: Normocephalic.  Mouth/Throat: Oropharynx is clear and moist. No oropharyngeal exudate.  Eyes: Conjunctivae and EOM are normal. Pupils are equal, round, and reactive to light. Right eye exhibits no discharge. Left eye exhibits no discharge. No scleral icterus.  Neck: Normal range of motion. No JVD present. No thyromegaly present.  Cardiovascular: Normal rate, regular rhythm, normal heart sounds and intact distal pulses.  Exam reveals no gallop and no friction rub.   No murmur heard. Pulmonary/Chest: Effort normal and breath sounds normal. No respiratory distress. She has no wheezes. She has no rales.  Abdominal: Soft. Bowel sounds are normal. She exhibits no distension. There is no tenderness. There is no  rebound and no guarding.  Musculoskeletal: She exhibits no edema, tenderness or deformity.  Unsteady gait. Moves x 4 extremities   Lymphadenopathy:    She has no cervical adenopathy.  Neurological: She is oriented to person, place, and time.  Skin: Skin is warm and dry. No rash noted. No erythema. No pallor.  Psychiatric: She has a normal mood and affect.    Labs reviewed: Basic Metabolic Panel:  Recent Labs  53/66/44 1017  03/08/16 0741 03/09/16 0329 03/10/16 0341  NA 140  < > 141 142 141  K 3.9  < > 4.5 4.4 4.3  CL 111  < > 116* 115* 116*  CO2 19*  < > 18* 20* 20*  GLUCOSE 127*  < > 93 94 99  BUN 39*  < > 38* 42* 40*  CREATININE 1.24*  < > 1.12* 1.35* 1.08*  CALCIUM 9.3  < > 9.3 9.1 9.1  MG 1.9  --  1.9  --   --   PHOS 2.4*  --   --   --   --   < > = values in this interval not displayed. Liver Function Tests:  Recent Labs  11/25/15 1553 12/19/15 0000 03/05/16 1017  AST 23 25 26   ALT 14 12* 11*  ALKPHOS 103 102 91  BILITOT 0.8 0.6 1.0  PROT 8.2* 6.8 6.9  ALBUMIN 4.3 3.5 3.9   CBC:  Recent Labs  06/03/15 2229  08/16/15 1710  12/20/15 0540 03/05/16 1017 03/06/16 0424  WBC 8.1  < > 6.4  < > 11.8* 11.2* 6.2  NEUTROABS 5.8  --  4.0  --   --   --   --   HGB 10.5*  < > 10.1*  < > 9.9* 12.3 11.2*  HCT 33.7*  < > 31.2*  < > 29.6* 36.2 32.8*  MCV 97.7  < > 91.0  < > 92.8 94.4 94.4  PLT 200  < > 307  < > 242 200 170  < > = values in this interval not displayed. Cardiac Enzymes:  Recent Labs  11/26/15 0837 12/19/15 0000 03/05/16 1017  CKTOTAL  --   --  126  TROPONINI 0.03* <0.03 <0.03    Recent Labs  06/04/15 1156 06/04/15 1557 06/04/15 2044  GLUCAP 119* 133* 91      Assessment/Plan:  1. Essential hypertension, benign SBP ranging in the 150's-170's. Hydralazine, lasix and indur discontinued during recent hospital admission. Will continue on Metoprolol and Amlodipine 5 mg tablet daily.Restart hydralazine at low dose 25 mg tablet three times  daily. Continue to monitor B/p and notify PCP. BMP in 1-2 weeks with PCP.   2. Chronic systolic congestive heart failure (HCC) Stable. No weight gain, edema, shortness of breath, rales or wheezing. Continue on metoprolol. Monitor weight.   3. Coronary artery disease involving native coronary artery of native heart without angina pectoris Chest pain free. Continue on ASA.   4. Dyslipidemia Continue on Lopid 600 mg Tablet twice daily.   5. Generalized weakness Has improved with PT/OT. Continue to monitor.   6. Depression with anxiety Stable. Continue on escitalopram. Monitor for mood changes.   7. Diarrhea of infectious origin Stool positive for C-diff during hospital admission was treated on Vancomycin oral soln. She completed antibiotics. Loose stool at baseline. Continue Acidophilus.     Patient is being discharged with the following home health services:   D/ced with family to relocate to [place].   Patient is being discharged with the following durable medical equipment:   None required.   Patient has been advised to f/u with their PCP in 1-2 weeks to for a transitions of care visit.  Social services at their facility was responsible for arranging this appointment.  Pt was provided with adequate prescriptions of noncontrolled medications to reach the scheduled appointment . For controlled substances, a limited supply was provided as appropriate for the individual patient.  If the pt normally receives these medications from a pain clinic or has a contract with another physician, these medications should be received from that clinic or physician only).    Future labs/tests needed:  CBC, BMP in 1-2 weeks with PCP

## 2017-04-24 IMAGING — CT CT HEAD W/O CM
5 of 7 series · 16 of 47 positions shown, 17 images · non-contrast
Comparison: 12/19/2015

CLINICAL DATA: Fall during the night. Right head hematoma. Patient
is on blood thinners. History of strokes with residual right
weakness.

EXAM:
CT HEAD WITHOUT CONTRAST
CT CERVICAL SPINE WITHOUT CONTRAST
TECHNIQUE: Multidetector CT imaging of the head and cervical spine was
performed following the standard protocol without intravenous
contrast. Multiplanar CT image reconstructions of the cervical spine
were also generated.

[Series 2: head wo · axial · 0.40mm/px · z∈[+534,+579]mm · 2 of 28 slices shown, 3 images]
[im 10/28  brain]
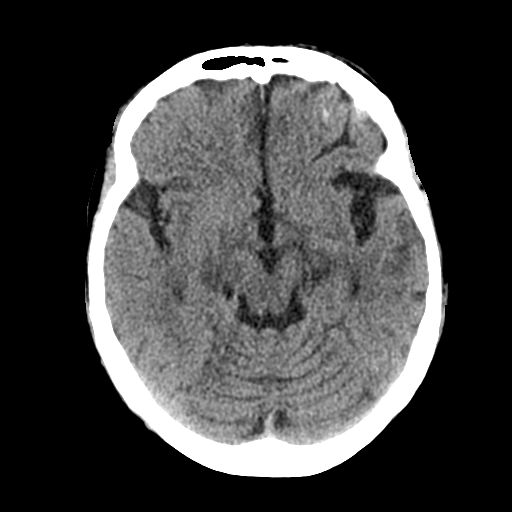
[im 10/28  bone]
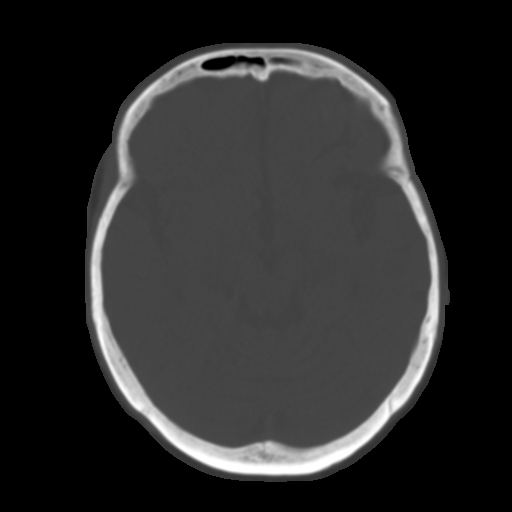
[im 19/28  brain]
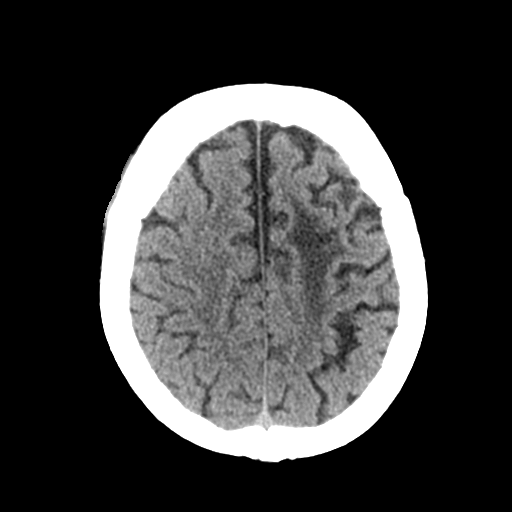

[Series 4: coronal soft tissue · coronal · 0.27mm/px · 2 of 58 slices shown]
[im 5/58  brain]
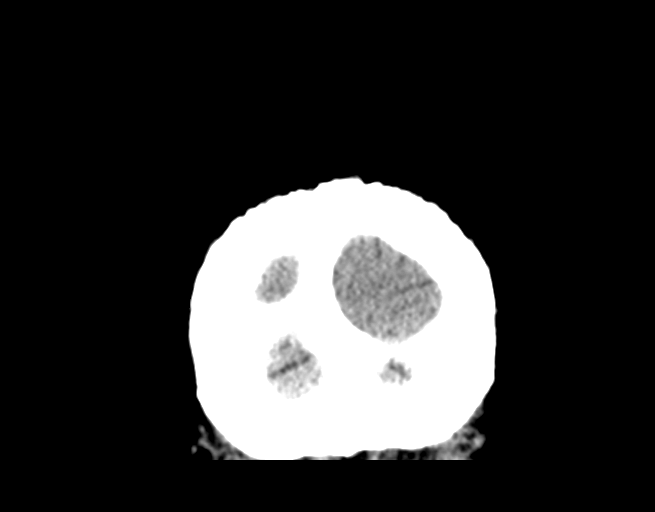
[im 9/58  brain]
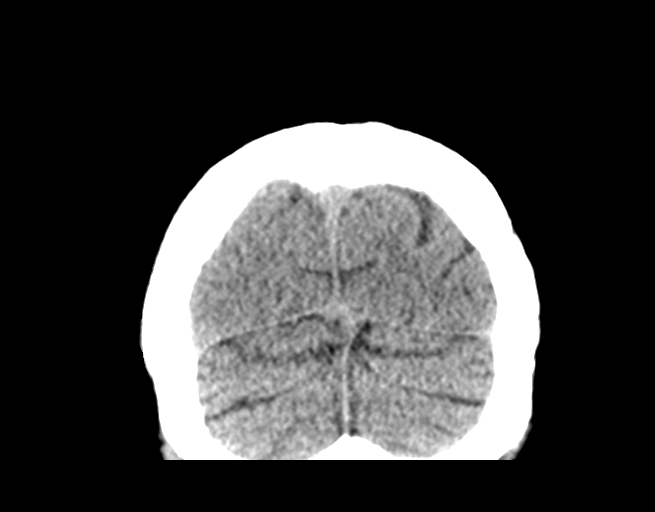

[Series 5: sagittal soft tissue · sagittal · 0.27mm/px · 2 of 51 slices shown]
[im 17/51  brain]
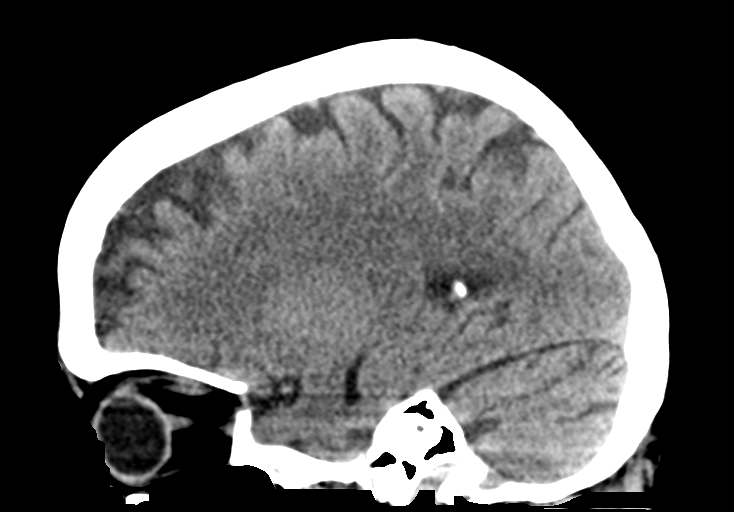
[im 34/51  brain]
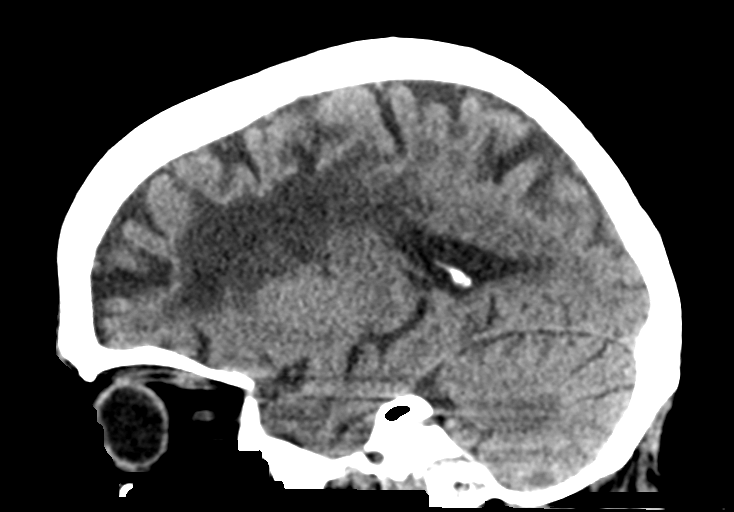

[Series 7: c spine soft · axial · 0.32mm/px · z∈[+368,+382]mm · 2 of 73 slices shown]
[im 7/73  brain]
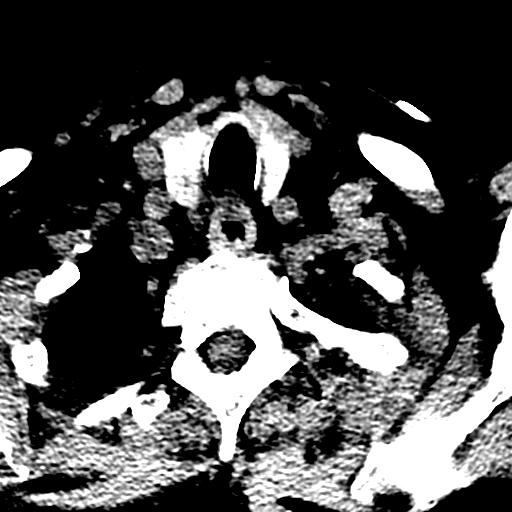
[im 14/73  brain]
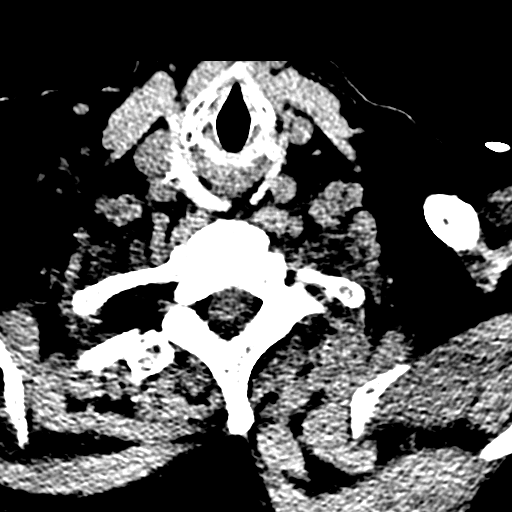

[Series 10: orthogonal bone · axial · 0.23mm/px · z∈[+332,+456]mm · 8 of 89 slices shown]
[im 7/89  bone]
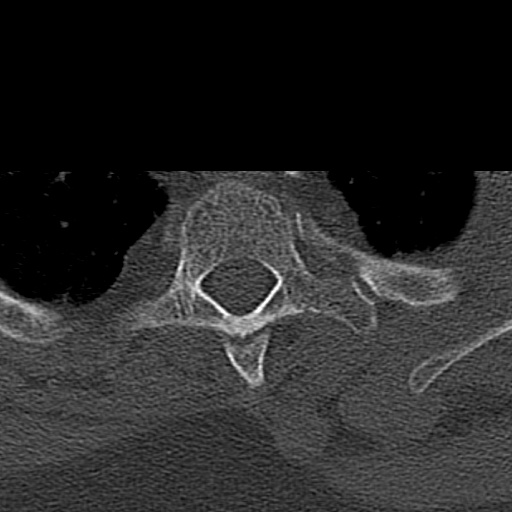
[im 21/89  bone]
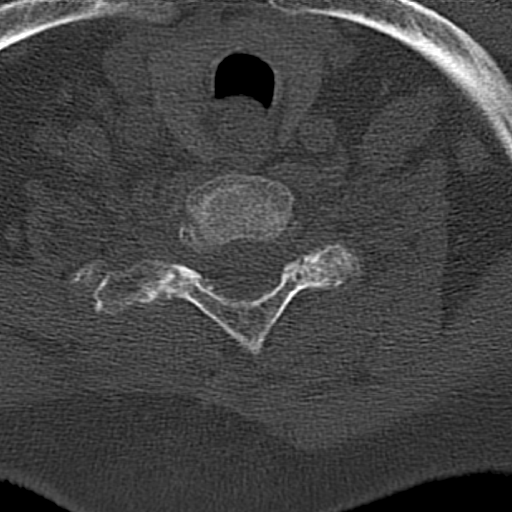
[im 28/89  bone]
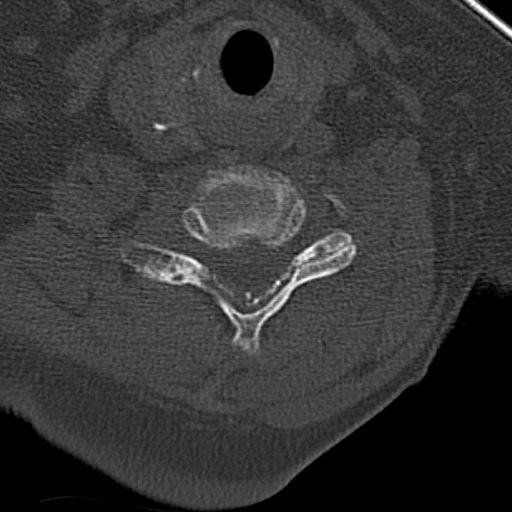
[im 41/89  bone]
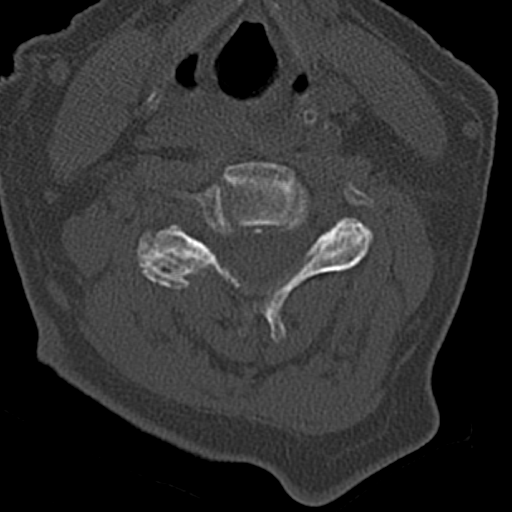
[im 48/89  bone]
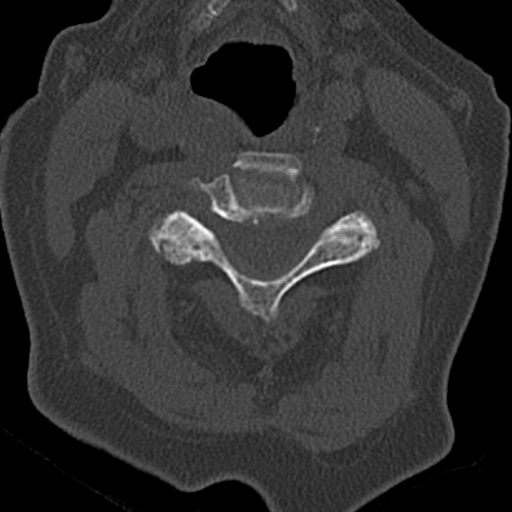
[im 61/89  bone]
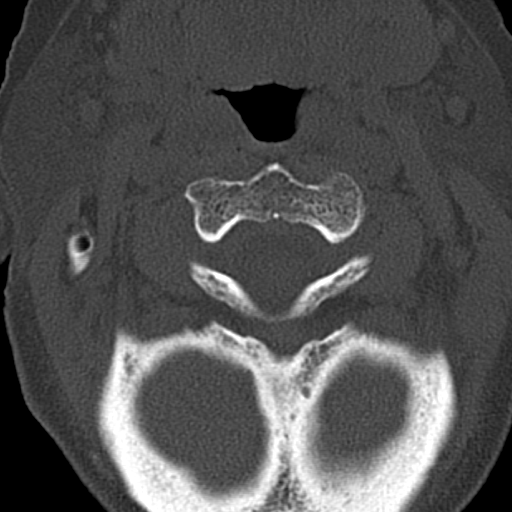
[im 68/89  bone]
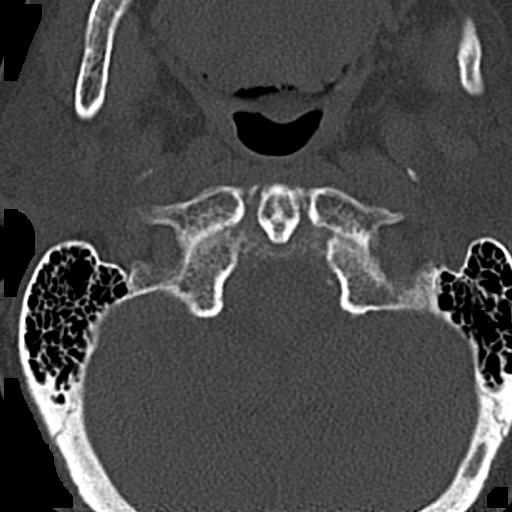
[im 82/89  bone]
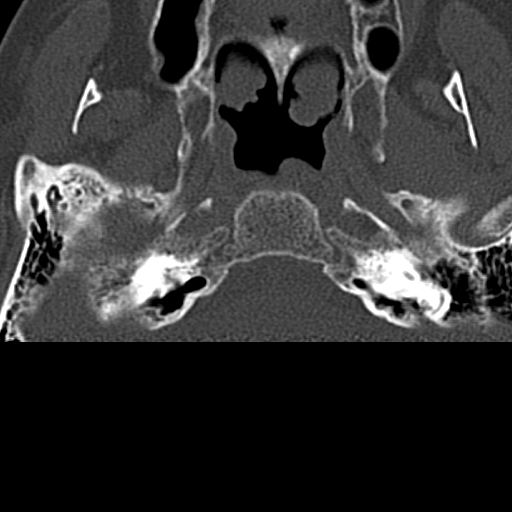

[16 of 47 positions shown; findings below may reference images not displayed]

FINDINGS: CT HEAD FINDINGS

Brain: The ventricles normal configuration. There is mild ex vacuo
dilation of the left lateral ventricle due to an old infarct.
Overall ventricular sizes are normal for age.

There are no parenchymal masses or mass effect. There is an old left
frontal infarct. There is no evidence of a recent infarct.

There are no extra-axial masses or abnormal fluid collections.

No intracranial hemorrhage.

Vascular: No hyperdense vessel or unexpected calcification.

Skull: Normal. Negative for fracture or focal lesion.

Sinuses/Orbits: Globes and orbits are unremarkable. Visualized
sinuses and mastoid air cells are clear.

Other: Right frontal scalp hematoma.

CT CERVICAL SPINE FINDINGS

Alignment: Straightening of the normal cervical lordosis. No
spondylolisthesis.

Skull base and vertebrae: No acute fracture. No primary bone lesion
or focal pathologic process.

Soft tissues and spinal canal: No prevertebral fluid or swelling. No
visible canal hematoma.

Disc levels: Moderate loss of disc height at C5-C6 with endplate
osteophytes and spondylotic disc bulging. Mild loss disc height at
C6-C7 with mild disc bulging. Facet degenerative change noted on the
right at C3-C4-C4-C5. No significant stenosis.

Upper chest: Unremarkable.

Other: None
IMPRESSION: HEAD CT:  No acute intracranial abnormalities.  No skull fracture.

CERVICAL CT:  No fracture or acute finding.

## 2017-04-24 IMAGING — CR DG PELVIS 1-2V
1 series · 1 of 1 positions shown · non-contrast
Comparison: CT, 11/25/2015

CLINICAL DATA: Patient brought in by [REDACTED] from home for fall
sometime during the night. Patient has hematoma to the right side of
the head, patient has right elbow skin tear, and has bruising on the
right leg. Pt states severe right shoulder pain and limited rom and
pain to RLE. Patient has hx/o multiple strokes with right sided
residual weakness.

EXAM:
PELVIS - 1-2 VIEW

[dg pelvis 1-2 views]
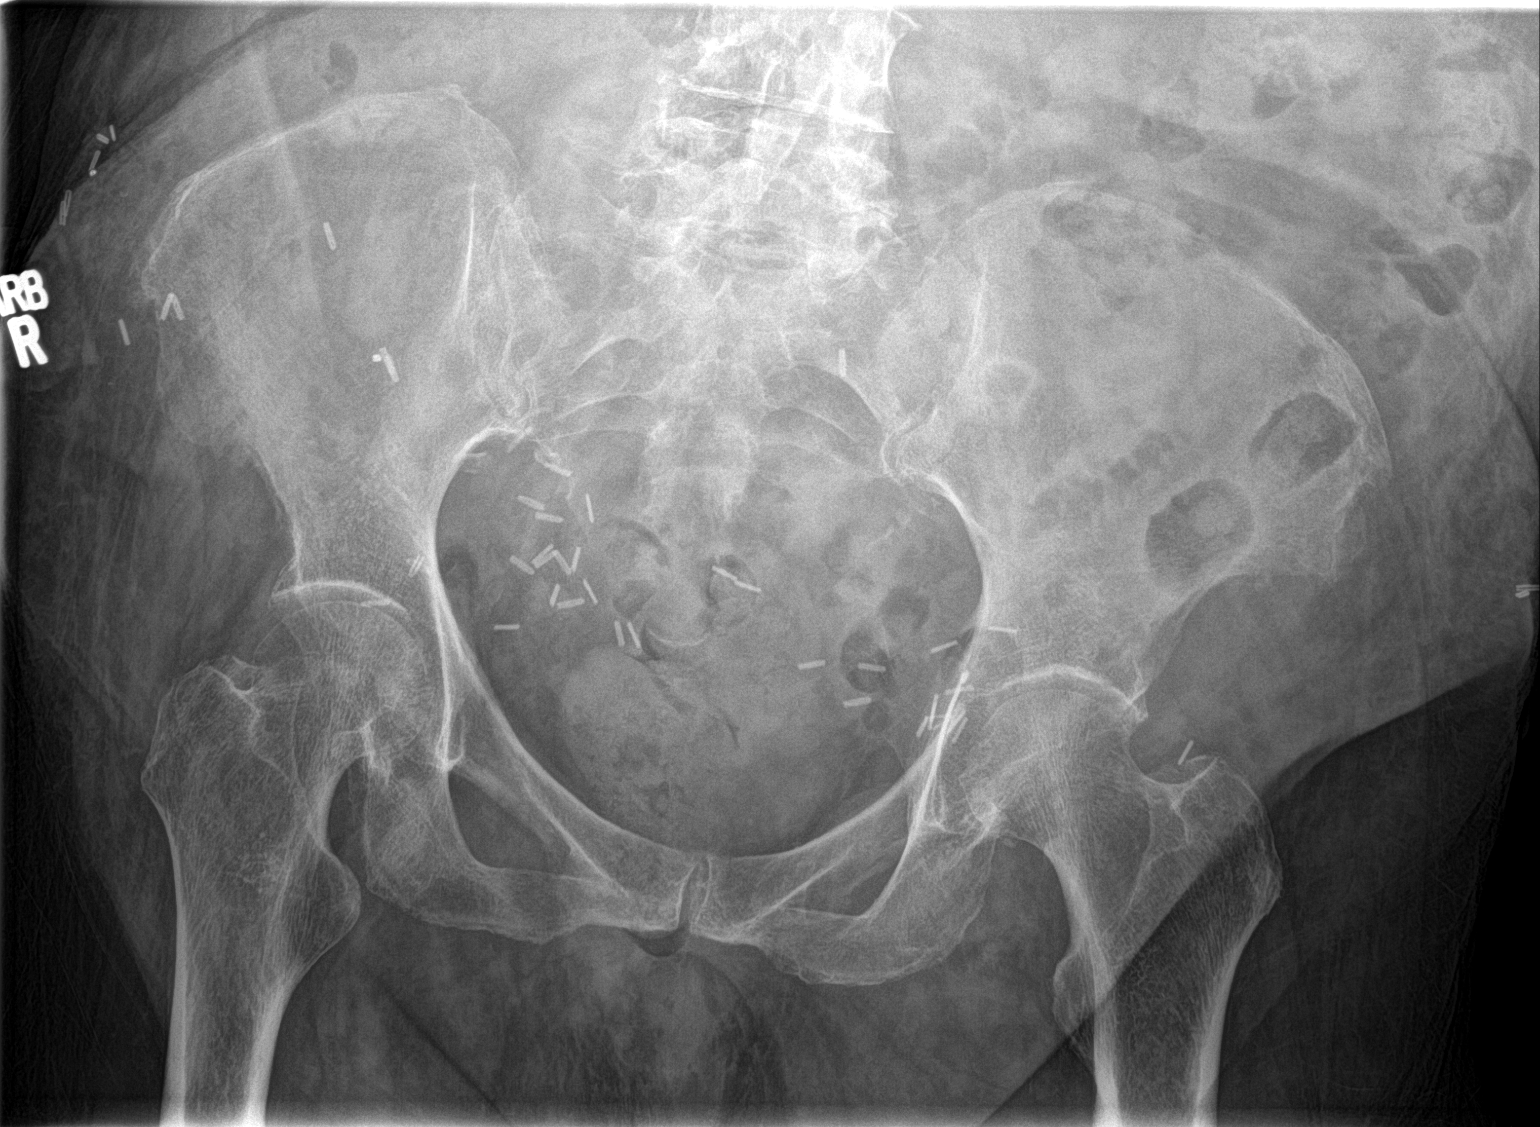

[1 of 1 positions shown; findings below may reference images not displayed]

FINDINGS: No fracture.  No bone lesion.

Hip joints, SI joints and symphysis pubis are normally aligned.

Bones are diffusely demineralized.

There are multiple surgical vascular clips in the pelvis. Soft
tissues are otherwise unremarkable.
IMPRESSION: No fracture or dislocation.

## 2021-10-27 DEATH — deceased
